# Patient Record
Sex: Female | Born: 1955 | Race: White | Hispanic: No | Smoking: Former smoker
Health system: Southern US, Community
[De-identification: ages and names within clinical notes are randomized; demographics above are authoritative.]

## PROBLEM LIST (undated history)

## (undated) DIAGNOSIS — E785 Hyperlipidemia, unspecified: Secondary | ICD-10-CM

## (undated) DIAGNOSIS — R55 Syncope and collapse: Secondary | ICD-10-CM

## (undated) DIAGNOSIS — I10 Essential (primary) hypertension: Secondary | ICD-10-CM

## (undated) DIAGNOSIS — J45909 Unspecified asthma, uncomplicated: Secondary | ICD-10-CM

## (undated) DIAGNOSIS — Z720 Tobacco use: Secondary | ICD-10-CM

## (undated) DIAGNOSIS — I639 Cerebral infarction, unspecified: Secondary | ICD-10-CM

## (undated) DIAGNOSIS — O039 Complete or unspecified spontaneous abortion without complication: Secondary | ICD-10-CM

## (undated) DIAGNOSIS — J439 Emphysema, unspecified: Secondary | ICD-10-CM

## (undated) DIAGNOSIS — M1990 Unspecified osteoarthritis, unspecified site: Secondary | ICD-10-CM

## (undated) DIAGNOSIS — E079 Disorder of thyroid, unspecified: Secondary | ICD-10-CM

## (undated) DIAGNOSIS — IMO0001 Reserved for inherently not codable concepts without codable children: Secondary | ICD-10-CM

## (undated) DIAGNOSIS — H269 Unspecified cataract: Secondary | ICD-10-CM

## (undated) HISTORY — DX: Unspecified asthma, uncomplicated: J45.909

## (undated) HISTORY — DX: Cerebral infarction, unspecified: I63.9

## (undated) HISTORY — PX: EYE SURGERY: SHX253

## (undated) HISTORY — DX: Disorder of thyroid, unspecified: E07.9

## (undated) HISTORY — DX: Syncope and collapse: R55

## (undated) HISTORY — PX: CATARACT EXTRACTION: SUR2

## (undated) HISTORY — DX: Unspecified cataract: H26.9

## (undated) HISTORY — DX: Unspecified osteoarthritis, unspecified site: M19.90

---
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
## 1992-09-01 HISTORY — PX: TUBAL LIGATION: SHX77

## 1996-09-01 HISTORY — PX: CHOLECYSTECTOMY: SHX55

## 1999-11-04 ENCOUNTER — Other Ambulatory Visit: Admission: RE | Admit: 1999-11-04 | Discharge: 1999-11-04 | Payer: Self-pay | Admitting: Obstetrics and Gynecology

## 1999-11-15 ENCOUNTER — Encounter: Payer: Self-pay | Admitting: Obstetrics and Gynecology

## 1999-11-15 ENCOUNTER — Ambulatory Visit (HOSPITAL_COMMUNITY): Admission: RE | Admit: 1999-11-15 | Discharge: 1999-11-15 | Payer: Self-pay | Admitting: Obstetrics and Gynecology

## 2020-10-15 ENCOUNTER — Ambulatory Visit (INDEPENDENT_AMBULATORY_CARE_PROVIDER_SITE_OTHER): Payer: Self-pay | Admitting: Physician Assistant

## 2020-10-15 ENCOUNTER — Other Ambulatory Visit: Payer: Self-pay

## 2020-10-15 ENCOUNTER — Encounter: Payer: Self-pay | Admitting: Physician Assistant

## 2020-10-15 VITALS — BP 142/90 | HR 92 | Temp 97.0°F | Resp 16 | Ht 61.0 in | Wt 198.0 lb

## 2020-10-15 DIAGNOSIS — M545 Low back pain, unspecified: Secondary | ICD-10-CM

## 2020-10-15 DIAGNOSIS — R55 Syncope and collapse: Secondary | ICD-10-CM

## 2020-10-15 DIAGNOSIS — R350 Frequency of micturition: Secondary | ICD-10-CM

## 2020-10-15 DIAGNOSIS — E038 Other specified hypothyroidism: Secondary | ICD-10-CM

## 2020-10-15 DIAGNOSIS — I10 Essential (primary) hypertension: Secondary | ICD-10-CM

## 2020-10-15 DIAGNOSIS — M170 Bilateral primary osteoarthritis of knee: Secondary | ICD-10-CM

## 2020-10-15 DIAGNOSIS — F17200 Nicotine dependence, unspecified, uncomplicated: Secondary | ICD-10-CM

## 2020-10-15 DIAGNOSIS — Z833 Family history of diabetes mellitus: Secondary | ICD-10-CM

## 2020-10-15 LAB — POCT URINALYSIS DIPSTICK
Bilirubin, UA: NEGATIVE
Blood, UA: NEGATIVE
Glucose, UA: NEGATIVE
Ketones, UA: NEGATIVE
Leukocytes, UA: NEGATIVE
Nitrite, UA: NEGATIVE
Protein, UA: NEGATIVE
Spec Grav, UA: >=1.03 — AB (ref 1.010–1.025)
Urobilinogen, UA: 0.2 E.U./dL
pH, UA: 5.5 (ref 5.0–8.0)

## 2020-10-15 LAB — COMPREHENSIVE METABOLIC PANEL
ALT: 12 U/L (ref 0–35)
AST: 16 U/L (ref 0–37)
Albumin: 4 g/dL (ref 3.5–5.2)
Alkaline Phosphatase: 98 U/L (ref 39–117)
BUN: 13 mg/dL (ref 6–23)
CO2: 27 mEq/L (ref 19–32)
Calcium: 10.2 mg/dL (ref 8.4–10.5)
Chloride: 106 mEq/L (ref 96–112)
Creatinine, Ser: 0.69 mg/dL (ref 0.40–1.20)
GFR: 91.53 mL/min (ref >60.00)
Glucose, Bld: 89 mg/dL (ref 70–99)
Potassium: 4.3 mEq/L (ref 3.5–5.1)
Sodium: 139 mEq/L (ref 135–145)
Total Bilirubin: 0.3 mg/dL (ref 0.2–1.2)
Total Protein: 7.2 g/dL (ref 6.0–8.3)

## 2020-10-15 LAB — CBC WITH DIFFERENTIAL/PLATELET
Basophils Absolute: 0.1 10*3/uL (ref 0.0–0.1)
Basophils Relative: 0.7 % (ref 0.0–3.0)
Eosinophils Absolute: 0.2 10*3/uL (ref 0.0–0.7)
Eosinophils Relative: 2.5 % (ref 0.0–5.0)
HCT: 42.5 % (ref 36.0–46.0)
Hemoglobin: 14.4 g/dL (ref 12.0–15.0)
Lymphocytes Relative: 16.9 % (ref 12.0–46.0)
Lymphs Abs: 1.5 10*3/uL (ref 0.7–4.0)
MCHC: 33.8 g/dL (ref 30.0–36.0)
MCV: 93.8 fl (ref 78.0–100.0)
Monocytes Absolute: 0.5 10*3/uL (ref 0.1–1.0)
Monocytes Relative: 5.5 % (ref 3.0–12.0)
Neutro Abs: 6.5 10*3/uL (ref 1.4–7.7)
Neutrophils Relative %: 74.4 % (ref 43.0–77.0)
Platelets: 307 10*3/uL (ref 150.0–400.0)
RBC: 4.53 Mil/uL (ref 3.87–5.11)
RDW: 12.9 % (ref 11.5–15.5)
WBC: 8.8 10*3/uL (ref 4.0–10.5)

## 2020-10-15 LAB — TSH: TSH: 3.21 u[IU]/mL (ref 0.35–4.50)
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
## 2020-10-15 NOTE — Patient Instructions (Addendum)
*Please set up MyChart *Go to the lab and results will be sent through MyChart, unless urgent, and I will call you directly *Let's plan on 6 month Annual Wellness Visit, plus a visit sooner pending lab results *GO IMMEDIATELY TO THE HOSPITAL IN CASE OF ANYMORE NEAR SYNCOPE OR SYNCOPE EVENTS     Syncope  Syncope refers to a condition in which a person temporarily loses consciousness. Syncope may also be called fainting or passing out. It is caused by a sudden decrease in blood flow to the brain. Even though most causes of syncope are not dangerous, syncope can be a sign of a serious medical problem. Your health care provider may do tests to find the reason why you are having syncope. Signs that you may be about to faint include:  Feeling dizzy or light-headed.  Feeling nauseous.  Seeing all white or all black in your field of vision.  Having cold, clammy skin. If you faint, get medical help right away. Call your local emergency services (911 in the [place] Do not drive yourself to the hospital. Follow these instructions at home: Pay attention to any changes in your symptoms. Take these actions to stay safe and to help relieve your symptoms: Lifestyle  Do not drive, use machinery, or play sports until your health care provider says it is okay.  Do not drink alcohol.  Do not use any products that contain nicotine or tobacco, such as cigarettes and e-cigarettes. If you need help quitting, ask your health care provider.  Drink enough fluid to keep your urine pale yellow. General instructions  Take over-the-counter and prescription medicines only as told by your health care provider.  If you are taking blood pressure or heart medicine, get up slowly and take several minutes to sit and then stand. This can reduce dizziness or light-headedness.  Have someone stay with you until you feel stable.  If you start to feel like you might faint, lie down right away and raise (elevate) your  feet above the level of your heart. Breathe deeply and steadily. Wait until all the symptoms have passed.  Keep all follow-up visits as told by your health care provider. This is important. Get help right away if you:  Have a severe headache.  Faint once or repeatedly.  Have pain in your chest, abdomen, or back.  Have a very fast or irregular heartbeat (palpitations).  Have pain when you breathe.  Are bleeding from your mouth or rectum, or you have black or tarry stool.  Have a seizure.  Are confused.  Have trouble walking.  Have severe weakness.  Have vision problems. These symptoms may represent a serious problem that is an emergency. Do not wait to see if your symptoms will go away. Get medical help right away. Call your local emergency services (911 in the [place] Do not drive yourself to the hospital. Summary  Syncope refers to a condition in which a person temporarily loses consciousness. It is caused by a sudden decrease in blood flow to the brain.  Signs that you may be about to faint include dizziness, feeling light-headed, feeling nauseous, sudden vision changes, or cold, clammy skin.  Although most causes of syncope are not dangerous, syncope can be a sign of a serious medical problem. If you faint, get medical help right away. This information is not intended to replace advice given to you by your health care provider. Make sure you discuss any questions you have with your health care provider. Document Revised:  12/29/2019 Document Reviewed: 12/29/2019 Elsevier Patient Education  2021 [employer].

## 2020-10-17 ENCOUNTER — Other Ambulatory Visit: Payer: Self-pay | Admitting: Physician Assistant

## 2020-10-17 ENCOUNTER — Other Ambulatory Visit: Payer: Self-pay | Admitting: Emergency Medicine

## 2020-10-17 DIAGNOSIS — R55 Syncope and collapse: Secondary | ICD-10-CM

## 2020-10-17 MED ORDER — PREDNISONE 50 MG PO TABS: ORAL_TABLET | ORAL | 0 refills | Status: DC

## 2021-05-20 ENCOUNTER — Ambulatory Visit: Payer: Self-pay | Admitting: Physician Assistant

## 2021-05-27 ENCOUNTER — Encounter: Payer: Self-pay | Admitting: Physician Assistant

## 2021-05-27 ENCOUNTER — Other Ambulatory Visit: Payer: Self-pay

## 2021-05-27 ENCOUNTER — Ambulatory Visit (INDEPENDENT_AMBULATORY_CARE_PROVIDER_SITE_OTHER): Payer: Medicare Other | Admitting: Physician Assistant

## 2021-05-27 VITALS — BP 141/88 | HR 83 | Temp 98.2°F | Ht 61.0 in | Wt 199.4 lb

## 2021-05-27 DIAGNOSIS — F17200 Nicotine dependence, unspecified, uncomplicated: Secondary | ICD-10-CM

## 2021-05-27 DIAGNOSIS — I10 Essential (primary) hypertension: Secondary | ICD-10-CM

## 2021-05-27 DIAGNOSIS — Z780 Asymptomatic menopausal state: Secondary | ICD-10-CM

## 2021-05-27 DIAGNOSIS — Z1211 Encounter for screening for malignant neoplasm of colon: Secondary | ICD-10-CM

## 2021-05-27 DIAGNOSIS — M79672 Pain in left foot: Secondary | ICD-10-CM

## 2021-05-27 DIAGNOSIS — Z122 Encounter for screening for malignant neoplasm of respiratory organs: Secondary | ICD-10-CM

## 2021-05-27 DIAGNOSIS — M79671 Pain in right foot: Secondary | ICD-10-CM

## 2021-05-27 DIAGNOSIS — Z9189 Other specified personal risk factors, not elsewhere classified: Secondary | ICD-10-CM

## 2021-05-27 DIAGNOSIS — R55 Syncope and collapse: Secondary | ICD-10-CM | POA: Diagnosis not present

## 2021-05-27 DIAGNOSIS — Z1231 Encounter for screening mammogram for malignant neoplasm of breast: Secondary | ICD-10-CM

## 2021-05-27 DIAGNOSIS — F17210 Nicotine dependence, cigarettes, uncomplicated: Secondary | ICD-10-CM

## 2021-05-27 DIAGNOSIS — M170 Bilateral primary osteoarthritis of knee: Secondary | ICD-10-CM | POA: Diagnosis not present

## 2021-05-27 NOTE — Patient Instructions (Addendum)
*  Please check BP at home over the next week, call back with readings. If still elevated 140s/high 80s-90s, need to start on BP medication.   Referrals placed today. Please stop smoking. Drink plenty of water, 60-80 ounces goal.  See you back in 3 months.

## 2021-05-27 NOTE — Progress Notes (Addendum)
Subjective:    Patient ID: Veronica Weber, female    DOB: [birth year], 65 y.o.   MRN: [id number]  Chief Complaint  Patient presents with   Referral    Podiatry,mammogram, Bone Density,Cologuard Orthopedic     HPI Patient is in today for need for several referrals.  She is here with her daughter.  Her main concern today is pain in her knees and feet. Worse in the last few years.  Works on Biochemist, clinical day Pharmacist, hospital, part-time), also cleans houses, and pain is very bad at the end of the day. Left knee is worse than right; feels like knees want to give out at end of day. Left heel is painful and right ball of foot causes her issues. Takes Ibuprofen, Tylenol, Advil as needed.  She is hoping to get a referral to podiatry and maybe sports medicine or orthopedics to discuss her issues and plan for treatment further.  She would also like to get several screening measures updated.  She would like to do her Cologuard and mammogram, as well as a bone density test.  She also wanted to follow-up on the episodes we discussed at her visit on 06/14/2021 of intermittent "passing out".  Patient states that she has not had any issues with this since September 03, 2020.  She is ready to see neurology and cardiology for additional evaluation.   Past Medical History:  Diagnosis Date   Arthritis    Cataract    Thyroid disease     Past Surgical History:  Procedure Laterality Date   CATARACT EXTRACTION     1997, 1998   CESAREAN SECTION  1983   CHOLECYSTECTOMY  1998   TUBAL LIGATION  1994    Family History  Problem Relation Age of Onset   Arthritis Mother    Healthy Daughter    Healthy Son    Cancer Maternal Grandmother    Diabetes Maternal Grandmother    Diabetes Maternal Grandfather     Social History   Tobacco Use   Smoking status: Every Day    Packs/day: 1.00    Years: 40.00    Pack years: 40.00    Types: Cigarettes   Smokeless tobacco: Never  Vaping Use    Vaping Use: Never used  Substance Use Topics   Alcohol use: Not Currently   Drug use: Never     No Known Allergies  Review of Systems REFER TO HPI FOR PERTINENT POSITIVES AND NEGATIVES      Objective:     BP (!) 141/88   Pulse 83   Temp 98.2 F (36.8 C)   Ht 5\' 1"  (1.549 m)   Wt 199 lb 6.1 oz (90.4 kg)   SpO2 94%   BMI 37.67 kg/m   Wt Readings from Last 3 Encounters:  06/03/21 200 lb 6.4 oz (90.9 kg)  05/27/21 199 lb 6.1 oz (90.4 kg)  10/15/20 198 lb (89.8 kg)    BP Readings from Last 3 Encounters:  06/03/21 (!) 166/102  05/27/21 (!) 141/88  10/15/20 (!) 142/90     Physical Exam Vitals and nursing note reviewed.  Constitutional:      Appearance: Normal appearance. She is normal weight. She is not toxic-appearing.  HENT:     Head: Normocephalic and atraumatic.     Right Ear: Tympanic membrane, ear canal and external ear normal.     Left Ear: Tympanic membrane, ear canal and external ear normal.     Nose: Nose normal.  Mouth/Throat:     Mouth: Mucous membranes are moist.  Eyes:     Extraocular Movements: Extraocular movements intact.     Conjunctiva/sclera: Conjunctivae normal.     Pupils: Pupils are equal, round, and reactive to light.  Cardiovascular:     Rate and Rhythm: Normal rate and regular rhythm.     Pulses: Normal pulses.     Heart sounds: Normal heart sounds.  Pulmonary:     Effort: Pulmonary effort is normal.     Breath sounds: Normal breath sounds.  Musculoskeletal:        General: Normal range of motion.     Cervical back: Normal range of motion and neck supple.  Skin:    General: Skin is warm and dry.  Neurological:     General: No focal deficit present.     Mental Status: She is alert and oriented to person, place, and time.  Psychiatric:        Mood and Affect: Mood normal.        Behavior: Behavior normal.        Thought Content: Thought content normal.        Judgment: Judgment normal.       Assessment & Plan:    Problem List Items Addressed This Visit   None Visit Diagnoses     Syncope, unspecified syncope type    -  Primary   Relevant Orders   Ambulatory referral to Neurology   Ambulatory referral to Cardiology   Current every day smoker       Relevant Orders   Ambulatory referral to Cardiology   Essential hypertension       Relevant Orders   Ambulatory referral to Cardiology   Osteoarthritis of both knees, unspecified osteoarthritis type       Relevant Orders   Ambulatory referral to Sports Medicine   Bilateral foot pain       Relevant Orders   Ambulatory referral to Podiatry   Screening for colon cancer       Relevant Orders   Cologuard   Encounter for screening mammogram for malignant neoplasm of breast       Relevant Orders   MM 3D SCREEN BREAST BILATERAL   Encounter for screening for lung cancer       At risk for loss of bone density       Relevant Orders   DG Bone Density   Postmenopausal       Relevant Orders   DG Bone Density   Cigarette nicotine dependence without complication           Patient now has Medicare insurance and is ready to get updated with several health maintenance requests and also additional evaluations from specialists.  I went through each request and placed these orders.  I still do not think she needs to be driving with her history of syncope until she sees cardiology and neurology.  Her daughter lives out of state, but is here for the next month to help her get to these appointments as they start to get scheduled.  She is really encouraged to quit smoking and drink more water.  We discussed low dose lung cancer screening due to her history of smoking (40 pack year history) and she is agreeable to have this done.  Her blood pressure is also elevated.  I have asked her to monitor this at home and if she is still having elevated readings, we will need to start on blood pressure medication.  Patient is understanding and agreeable.   This note was  prepared with assistance of Conservation officer, historic buildings. Occasional wrong-word or sound-a-like substitutions may have occurred due to the inherent limitations of voice recognition software.  Time Spent: 32 minutes of total time was spent on the date of the encounter performing the following actions: chart review prior to seeing the patient, obtaining history, performing a medically necessary exam, counseling on the treatment plan, placing orders, and documenting in our EHR.    Olyver Hawes M Momodou Consiglio, PA-C

## 2021-05-30 ENCOUNTER — Other Ambulatory Visit: Payer: Self-pay

## 2021-05-30 ENCOUNTER — Telehealth: Payer: Self-pay

## 2021-05-30 DIAGNOSIS — Z122 Encounter for screening for malignant neoplasm of respiratory organs: Secondary | ICD-10-CM

## 2021-05-30 NOTE — Telephone Encounter (Signed)
Patients daughter called and looking at her AVS she seen it said Encounter for screening for lung cancer on the right hand side under issues that were addressed and wanted to know where they go to get this done and If the referral was placed for them   Please return call

## 2021-05-30 NOTE — Telephone Encounter (Signed)
Pt called regarding her blood pressure readings. She stated she forgot to check the past 2 days but today it was  173/97. Please Advise.

## 2021-06-02 ENCOUNTER — Other Ambulatory Visit: Payer: Self-pay | Admitting: Physician Assistant

## 2021-06-02 DIAGNOSIS — Z122 Encounter for screening for malignant neoplasm of respiratory organs: Secondary | ICD-10-CM

## 2021-06-03 ENCOUNTER — Ambulatory Visit (INDEPENDENT_AMBULATORY_CARE_PROVIDER_SITE_OTHER): Payer: Medicare Other

## 2021-06-03 ENCOUNTER — Ambulatory Visit: Payer: Self-pay

## 2021-06-03 ENCOUNTER — Ambulatory Visit (INDEPENDENT_AMBULATORY_CARE_PROVIDER_SITE_OTHER): Payer: Medicare Other | Admitting: Family Medicine

## 2021-06-03 ENCOUNTER — Other Ambulatory Visit: Payer: Self-pay

## 2021-06-03 VITALS — BP 166/102 | HR 87 | Ht 61.0 in | Wt 200.4 lb

## 2021-06-03 DIAGNOSIS — M25562 Pain in left knee: Secondary | ICD-10-CM

## 2021-06-03 DIAGNOSIS — G8929 Other chronic pain: Secondary | ICD-10-CM

## 2021-06-03 DIAGNOSIS — M25561 Pain in right knee: Secondary | ICD-10-CM | POA: Diagnosis not present

## 2021-06-03 NOTE — Patient Instructions (Signed)
Thank you for coming in today.   Please get an Xray today before you leave   Call or go to the ER if you develop a large red swollen joint with extreme pain or oozing puss.    Please use Voltaren gel (Generic Diclofenac Gel) up to 4x daily for pain as needed.  This is available over-the-counter as both the name brand Voltaren gel and the generic diclofenac gel.   Let me know if not better.  Next step could be gel shots.   I can do the cortisone shot every 3 months.

## 2021-06-03 NOTE — Progress Notes (Signed)
I, Philbert Riser, LAT, ATC acting as a scribe for [name], MD.  Subjective:    CC: Bilat knee pain  HPI: Pt is a 65 y/o female c/o bilat knee pain x years, L>R. Pt notes a hx of injury to her L knee in 1991.  Pt locates pain to the anterior aspect of bilat knees w/ an occasional sharp pain in the posterior aspect.  Knee swelling: no Radiates: yes Mechanical symptoms: yes Aggravates: standing on hard floors, stairs Treatments tried: naproxen, IBU, Tylenol    Pertinent review of Systems: No fevers or chills  Relevant historical information: Hypertension   Objective:    Vitals:   06/03/21 1353  BP: (!) 166/102  Pulse: 87  SpO2: 97%   General: Well Developed, well nourished, and in no acute distress.   MSK: Right knee mild effusion normal motion with crepitation.  Stable ligamentous exam intact strength.  Left knee moderate effusion normal motion with crepitation stable ligamentous exam.  Intact strength.  Lab and Radiology Results  Procedure: Real-time Ultrasound Guided Injection of the right knee superior lateral patellar space Device: Philips Affiniti 50G Images permanently stored and available for review in PACS Verbal informed consent obtained.  Discussed risks and benefits of procedure. Warned about infection bleeding damage to structures skin hypopigmentation and fat atrophy among others. Patient expresses understanding and agreement Time-out conducted.   Noted no overlying erythema, induration, or other signs of local infection.   Skin prepped in a sterile fashion.   Local anesthesia: Topical Ethyl chloride.   With sterile technique and under real time ultrasound guidance: 40 mg of Kenalog and 2 mL of lidocaine injected into knee joint. Fluid seen entering the joint capsule.   Completed without difficulty   Pain immediately resolved suggesting accurate placement of the medication.   Advised to call if fevers/chills, erythema, induration, drainage, or  persistent bleeding.   Images permanently stored and available for review in the ultrasound unit.  Impression: Technically successful ultrasound guided injection.    Procedure: Real-time Ultrasound Guided Injection of left knee superior lateral patellar space Device: Philips Affiniti 50G Images permanently stored and available for review in PACS Verbal informed consent obtained.  Discussed risks and benefits of procedure. Warned about infection bleeding damage to structures skin hypopigmentation and fat atrophy among others. Patient expresses understanding and agreement Time-out conducted.   Noted no overlying erythema, induration, or other signs of local infection.   Skin prepped in a sterile fashion.   Local anesthesia: Topical Ethyl chloride.   With sterile technique and under real time ultrasound guidance: 40 mg of Kenalog and 2 mL of lidocaine injected into knee joint. Fluid seen entering the joint capsule.   Completed without difficulty   Pain immediately resolved suggesting accurate placement of the medication.   Advised to call if fevers/chills, erythema, induration, drainage, or persistent bleeding.   Images permanently stored and available for review in the ultrasound unit.  Impression: Technically successful ultrasound guided injection.    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  Moderate patellofemoral DJD.  No acute fractures.  Left knee: Severe medial compartment DJD.  Moderate DJD.  Await formal radiology review    Impression and Recommendations:    Assessment and Plan: 65 y.o. female with bilateral knee pain left worse than right.  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  This is probably the main driver of her knee  pain.  Plan for steroid injection today.  We will also work on hyaluronic acid authorization as a backup plan if the  steroid injections do not last very long as she does report this is a history of hers in the past.  Ultimately she is likely to need a knee replacement.  Recommend weight loss and quad strengthening as well.  Also recommend Voltaren gel.  PDMP not reviewed this encounter. Orders Placed This Encounter  Procedures   [place] LIMITED JOINT SPACE STRUCTURES LOW BILAT(NO LINKED CHARGES)    Standing Status:   Future    Number of Occurrences:   1    Standing Expiration Date:   12/02/2021    Order Specific Question:   Reason for Exam (SYMPTOM  OR DIAGNOSIS REQUIRED)    Answer:   bilateral knee pain    Order Specific Question:   Preferred imaging location?    Answer:   [facility] Knee AP/LAT W/Sunrise Left    Standing Status:   Future    Number of Occurrences:   1    Standing Expiration Date:   06/03/2022    Order Specific Question:   Reason for Exam (SYMPTOM  OR DIAGNOSIS REQUIRED)    Answer:   bilateral knee pain    Order Specific Question:   Preferred imaging location?    Answer:   [facility] Knee AP/LAT W/Sunrise Right    Standing Status:   Future    Number of Occurrences:   1    Standing Expiration Date:   06/03/2022    Order Specific Question:   Reason for Exam (SYMPTOM  OR DIAGNOSIS REQUIRED)    Answer:   bilateral knee pain    Order Specific Question:   Preferred imaging location?    Answer:   [facility]   No orders of the defined types were placed in this encounter.   Discussed warning signs or symptoms. Please see discharge instructions. Patient expresses understanding.   The above documentation has been reviewed and is accurate and complete [name], M.D.

## 2021-06-04 ENCOUNTER — Other Ambulatory Visit: Payer: Self-pay

## 2021-06-04 MED ORDER — LOSARTAN POTASSIUM 25 MG PO TABS: 25.0000 mg | ORAL_TABLET | Freq: Every day | ORAL | 0 refills | Status: DC

## 2021-06-04 NOTE — Telephone Encounter (Signed)
Rx sent in

## 2021-06-04 NOTE — Telephone Encounter (Signed)
Called pt to inform her approval for bilat Monovisc injections. Pt was advised to call the office whenever her knees become painful and she's interested in pursing the gel injection.

## 2021-06-04 NOTE — Progress Notes (Signed)
Right knee x-ray shows severe arthritis in the medial (inside part) of the knee

## 2021-06-04 NOTE — Progress Notes (Signed)
Left knee x-ray shows severe arthritis on the medial (inside part) of the knee.

## 2021-06-05 ENCOUNTER — Telehealth: Payer: Self-pay | Admitting: Acute Care

## 2021-06-05 ENCOUNTER — Other Ambulatory Visit: Payer: Self-pay | Admitting: Physician Assistant

## 2021-06-05 ENCOUNTER — Encounter: Payer: Self-pay | Admitting: Podiatry

## 2021-06-05 ENCOUNTER — Ambulatory Visit (INDEPENDENT_AMBULATORY_CARE_PROVIDER_SITE_OTHER): Payer: Medicare Other | Admitting: Podiatry

## 2021-06-05 ENCOUNTER — Other Ambulatory Visit: Payer: Self-pay

## 2021-06-05 ENCOUNTER — Ambulatory Visit (INDEPENDENT_AMBULATORY_CARE_PROVIDER_SITE_OTHER): Payer: Medicare Other

## 2021-06-05 DIAGNOSIS — M79674 Pain in right toe(s): Secondary | ICD-10-CM

## 2021-06-05 DIAGNOSIS — M79671 Pain in right foot: Secondary | ICD-10-CM

## 2021-06-05 DIAGNOSIS — M79675 Pain in left toe(s): Secondary | ICD-10-CM | POA: Diagnosis not present

## 2021-06-05 DIAGNOSIS — M79672 Pain in left foot: Secondary | ICD-10-CM

## 2021-06-05 DIAGNOSIS — F17210 Nicotine dependence, cigarettes, uncomplicated: Secondary | ICD-10-CM

## 2021-06-05 DIAGNOSIS — B351 Tinea unguium: Secondary | ICD-10-CM

## 2021-06-05 DIAGNOSIS — L989 Disorder of the skin and subcutaneous tissue, unspecified: Secondary | ICD-10-CM

## 2021-06-10 NOTE — Telephone Encounter (Signed)
Spoke with Misty Stanley at [facility] office and verified that pt's referral to lung cancer screening is in my workque and assured her that she entered referral correctly. Nothing further needed at this time.

## 2021-06-11 NOTE — Progress Notes (Signed)
   SUBJECTIVE Patient presents to office today complaining of elongated, thickened nails that cause pain while ambulating in shoes.  Patient is unable to trim their own nails.  Patient also complains of painful calluses specifically to the left foot that seem to be recurring.  She tried to file them down herself with a very symptomatic and it causes pain.  patient is here for further evaluation and treatment.  Past Medical History:  Diagnosis Date   Arthritis    Cataract    Thyroid disease     OBJECTIVE General Patient is awake, alert, and oriented x 3 and in no acute distress. Derm Skin is dry and supple bilateral. Negative open lesions or macerations. Remaining integument unremarkable. Nails are tender, long, thickened and dystrophic with subungual debris, consistent with onychomycosis, 1-5 bilateral. No signs of infection noted. Symptomatic hyperkeratotic callus lesions are also noted to the weightbearing surfaces of the bilateral feet Vasc  DP and PT pedal pulses palpable bilaterally. Temperature gradient within normal limits.  Neuro Epicritic and protective threshold sensation grossly intact bilaterally.  Musculoskeletal Exam No symptomatic pedal deformities noted bilateral. Muscular strength within normal limits.  ASSESSMENT 1.  Pain due to onychomycosis of toenails both 2.  Porokeratosis bilateral feet  PLAN OF CARE 1. Patient evaluated today.  2. Instructed to maintain good pedal hygiene and foot care.  3. Mechanical debridement of nails 1-5 bilaterally performed using a nail nipper. Filed with dremel without incident.  4.  Excisional debridement of the porokeratosis callus lesions was performed using 312 scalpel without incident or bleeding.  Salicylic acid applied.  Recommend daily OTC corn and callus remover  5.  Continue new balance shoes and arch supports  6.  Return to clinic as needed     Felecia Shelling, DPM [facility]  Dr. Felecia Shelling, DPM     [address] [place], [place] [address]                Office [phone]  Fax [phone]

## 2021-06-25 NOTE — Progress Notes (Signed)
[facility]  PATIENT: Veronica Weber DOB: 1955/10/23  REFERRING CLINICIAN: Allwardt, Crist Infante, PA-C HISTORY FROM: self REASON FOR VISIT: syncope   HISTORICAL  CHIEF COMPLAINT:  Chief Complaint  Patient presents with   Syncope    Rm 6 New Pt  "last time I passed out was 09/04/2020, none since then but I have a history of this x 8 years; will see cardiology on 07/09/21"     HISTORY OF PRESENT ILLNESS:  The patient presents for evaluation of syncopal episodes which have occurred over the past 8 years. Her last episode of syncope was 09/04/20. At that time she was at work and she developed a "funny sensation like a pressure or a smell" in her sinuses. Then she developed pressure in the right side of her head. She then developed palpitations and diaphoresis. States it felt like a panic attack. Felt lightheaded and lost consciousness. No associated shaking, loss of continence, or tongue biting. Regained consciousness after a few seconds and felt back to normal after ~5 minutes.  All of her episodes occur with the same symptoms. Sometimes she will just have one in a day but sometimes they will come on and off throughout the day, occurring every 15-20 minutes. They occur every ~6 months or so. No clear triggers that she can identify. They can occur whether she is standing or sitting.  She does occasionally get right sided headaches which do not respond to OTCs. Has some nausea with the headaches. Bright lights and loud noises bother her at baseline.  Takes losartan for HTN. Just started this earlier this month and it has not seemed to make a difference in her episodes.  OTHER MEDICAL CONDITIONS: HTN   REVIEW OF SYSTEMS: Full 14 system review of systems performed and negative with exception of: syncope, palpitations  ALLERGIES: No Known Allergies  HOME MEDICATIONS: Outpatient Medications Prior to Visit  Medication Sig Dispense Refill   acetaminophen (TYLENOL)  500 MG tablet Take 500 mg by mouth every 6 (six) hours as needed.     ibuprofen (ADVIL) 200 MG tablet Take 200 mg by mouth every 6 (six) hours as needed.     losartan (COZAAR) 25 MG tablet Take 1 tablet (25 mg total) by mouth daily. 90 tablet 0   No facility-administered medications prior to visit.    PAST MEDICAL HISTORY: Past Medical History:  Diagnosis Date   Arthritis    Cataract    Thyroid disease     PAST SURGICAL HISTORY: Past Surgical History:  Procedure Laterality Date   CATARACT EXTRACTION     1997, 1998   CESAREAN SECTION  1983   CHOLECYSTECTOMY  1998   TUBAL LIGATION  1994    FAMILY HISTORY: Family History  Problem Relation Age of Onset   Arthritis Mother    Cancer Maternal Grandmother    Diabetes Maternal Grandmother    Diabetes Maternal Grandfather    Healthy Daughter    Healthy Son     SOCIAL HISTORY: Social History   Socioeconomic History   Marital status: Divorced    Spouse name: Not on file   Number of children: 2   Years of education: Not on file   Highest education level: Some college, no degree  Occupational History   Not on file  Tobacco Use   Smoking status: Every Day    Packs/day: 0.50    Years: 40.00    Pack years: 20.00    Types: Cigarettes   Smokeless tobacco: Never  Vaping Use   Vaping Use: Never used  Substance and Sexual Activity   Alcohol use: Not Currently   Drug use: Never   Sexual activity: Not Currently  Other Topics Concern   Not on file  Social History Narrative   Lives alone   Caffeine- coffee 1 -2 c daily   Social Determinants of Health   Financial Resource Strain: Not on file  Food Insecurity: Not on file  Transportation Needs: Not on file  Physical Activity: Not on file  Stress: Not on file  Social Connections: Not on file  Intimate Partner Violence: Not on file     PHYSICAL EXAM  GENERAL EXAM/CONSTITUTIONAL: Vitals:  Vitals:   06/26/21 1247  BP: (!) 146/89  Pulse: 97  Weight: 197 lb (89.4  kg)  Height: 5' 0.75" (1.543 m)   Body mass index is 37.53 kg/m. Wt Readings from Last 3 Encounters:  06/26/21 197 lb (89.4 kg)  06/03/21 200 lb 6.4 oz (90.9 kg)  05/27/21 199 lb 6.1 oz (90.4 kg)   Patient is in no distress; well developed, nourished and groomed; neck is supple  CARDIOVASCULAR: Examination of peripheral vascular system by observation and palpation is normal  EYES: Pupils round and reactive to light, Visual fields full to confrontation, Extraocular movements intacts,   MUSCULOSKELETAL: Gait, strength, tone, movements noted in Neurologic exam below  NEUROLOGIC: MENTAL STATUS:  awake, alert, oriented to person, place and time recent and remote memory intact normal attention and concentration language fluent, comprehension intact, naming intact fund of knowledge appropriate  CRANIAL NERVE:  2nd, 3rd, 4th, 6th - pupils equal and reactive to light, visual fields full to confrontation, extraocular muscles intact, no nystagmus 5th - facial sensation symmetric 7th - facial strength symmetric 8th - hearing intact 9th - palate elevates symmetrically, uvula midline 11th - shoulder shrug symmetric 12th - tongue protrusion midline  MOTOR:  normal bulk and tone, full strength in the BUE, BLE  SENSORY:  normal and symmetric to light touch all 4 extremities  COORDINATION:  finger-nose-finger intact  REFLEXES:  deep tendon reflexes present and symmetric  GAIT/STATION:  normal     DIAGNOSTIC DATA (LABS, IMAGING, TESTING) - I reviewed patient records, labs, notes, testing and imaging myself where available.  Lab Results  Component Value Date   WBC 8.8 10/15/2020   HGB 14.4 10/15/2020   HCT 42.5 10/15/2020   MCV 93.8 10/15/2020   PLT 307.0 10/15/2020      Component Value Date/Time   NA 139 10/15/2020 1142   K 4.3 10/15/2020 1142   CL 106 10/15/2020 1142   CO2 27 10/15/2020 1142   GLUCOSE 89 10/15/2020 1142   BUN 13 10/15/2020 1142   CREATININE  0.69 10/15/2020 1142   CALCIUM 10.2 10/15/2020 1142   PROT 7.2 10/15/2020 1142   ALBUMIN 4.0 10/15/2020 1142   AST 16 10/15/2020 1142   ALT 12 10/15/2020 1142   ALKPHOS 98 10/15/2020 1142   BILITOT 0.3 10/15/2020 1142   No results found for: CHOL, HDL, LDLCALC, LDLDIRECT, TRIG, CHOLHDL No results found for: HENI7P No results found for: VITAMINB12 Lab Results  Component Value Date   TSH 3.21 10/15/2020       ASSESSMENT AND PLAN  65 y.o. year old female with a history of HTN who presents for evaluation of episodes of loss of consciousness. Her prodromal symptoms sound most consistent with syncope, though unusual head sensation/smell and feeling of panic before episode can be seen in seizures. Will order EEG  to assess for signs of epilepsy. She will be evaluated by Cardiology on 11/8 for cardiogenic signs of syncope. Encouraged her to keep this appointment especially as she does endorse a family history of heart murmurs.   1. Syncope and collapse       PLAN: -Routine EEG -Cardiology appointment scheduled for 11/8 -next steps: consider MRI brain if cardiology workup negative or abnormalities seen on EEG  Orders Placed This Encounter  Procedures   EEG adult     Return after testing.    [name], MD 06/26/21 1:41 pm  I spent an average of 49 minutes chart reviewing and counseling the patient, with at least 50% of the time face to face with the patient.   [facility] [address], [address] [place], [place] [address] [phone]

## 2021-06-26 ENCOUNTER — Other Ambulatory Visit: Payer: Self-pay

## 2021-06-26 ENCOUNTER — Ambulatory Visit (INDEPENDENT_AMBULATORY_CARE_PROVIDER_SITE_OTHER): Payer: Medicare Other | Admitting: Psychiatry

## 2021-06-26 ENCOUNTER — Encounter: Payer: Self-pay | Admitting: Psychiatry

## 2021-06-26 VITALS — BP 146/89 | HR 97 | Ht 60.75 in | Wt 197.0 lb

## 2021-06-26 DIAGNOSIS — R55 Syncope and collapse: Secondary | ICD-10-CM

## 2021-06-26 NOTE — Patient Instructions (Signed)
EEG to look for seizures

## 2021-06-28 ENCOUNTER — Ambulatory Visit
Admission: RE | Admit: 2021-06-28 | Discharge: 2021-06-28 | Disposition: A | Discharge status: Home / Self Care | Payer: Medicare Other | Source: Ambulatory Visit | Attending: Physician Assistant | Admitting: Physician Assistant

## 2021-06-28 ENCOUNTER — Other Ambulatory Visit: Payer: Self-pay

## 2021-06-28 DIAGNOSIS — F17210 Nicotine dependence, cigarettes, uncomplicated: Secondary | ICD-10-CM

## 2021-07-01 ENCOUNTER — Ambulatory Visit (INDEPENDENT_AMBULATORY_CARE_PROVIDER_SITE_OTHER): Payer: Medicare Other | Admitting: Neurology

## 2021-07-01 DIAGNOSIS — R55 Syncope and collapse: Secondary | ICD-10-CM | POA: Diagnosis not present

## 2021-07-03 NOTE — Procedures (Signed)
    History:  18 yof with multiple syncope vs. seizure  EEG classification: Awake and drowsy  Description of the recording: The background rhythms of this recording consists of a fairly well modulated medium amplitude alpha rhythm of 9-10 Hz that is reactive to eye opening and closure. As the record progresses, the patient appears to remain in the waking state throughout the recording. Photic stimulation was performed, did not show any abnormalities. Hyperventilation was also performed, did not show any abnormalities. Toward the end of the recording, the patient enters the drowsy state with slight symmetric slowing seen. The patient never enters stage II sleep. No abnormal epileptiform discharges seen during this recording. There was no focal slowing. EKG monitor shows no evidence of cardiac rhythm abnormalities with a heart rate of 84.  Impression: This is a normal EEG recording in the waking and drowsy state. No evidence of interictal epileptiform discharges seen. A normal EEG does not exclude a diagnosis of epilepsy.    Windell Norfolk, MD [facility]

## 2021-07-09 ENCOUNTER — Other Ambulatory Visit: Payer: Self-pay

## 2021-07-09 ENCOUNTER — Ambulatory Visit (INDEPENDENT_AMBULATORY_CARE_PROVIDER_SITE_OTHER): Payer: Medicare Other | Admitting: Internal Medicine

## 2021-07-09 ENCOUNTER — Encounter: Payer: Self-pay | Admitting: Internal Medicine

## 2021-07-09 VITALS — BP 142/84 | HR 92 | Ht 60.75 in | Wt 199.0 lb

## 2021-07-09 DIAGNOSIS — I7090 Unspecified atherosclerosis: Secondary | ICD-10-CM | POA: Diagnosis not present

## 2021-07-09 DIAGNOSIS — R55 Syncope and collapse: Secondary | ICD-10-CM

## 2021-07-09 LAB — LIPID PANEL
Chol/HDL Ratio: 4.9 ratio — ABNORMAL HIGH (ref 0.0–4.4)
Cholesterol, Total: 251 mg/dL — ABNORMAL HIGH (ref 100–199)
HDL: 51 mg/dL (ref >39)
LDL Chol Calc (NIH): 173 mg/dL — ABNORMAL HIGH (ref 0–99)
Triglycerides: 148 mg/dL (ref 0–149)
VLDL Cholesterol Cal: 27 mg/dL (ref 5–40)

## 2021-07-09 MED ORDER — LOSARTAN POTASSIUM 50 MG PO TABS: 50.0000 mg | ORAL_TABLET | Freq: Every day | ORAL | 3 refills | Status: DC

## 2021-07-09 NOTE — Patient Instructions (Signed)
Medication Instructions:  Increase Losartan to 50 mg daily Continue all other medications *If you need a refill on your cardiac medications before your next appointment, please call your pharmacy*   Lab Work: Lipid Panel   Testing/Procedures: Echo  30 day Event Monitor   Follow-Up: At [facility], you and your health needs are our priority.  As part of our continuing mission to provide you with exceptional heart care, we have created designated Provider Care Teams.  These Care Teams include your primary Cardiologist (physician) and Advanced Practice Providers (APPs -  Physician Assistants and Nurse Practitioners) who all work together to provide you with the care you need, when you need it.  We recommend signing up for the patient portal called "MyChart".  Sign up information is provided on this After Visit Summary.  MyChart is used to connect with patients for Virtual Visits (Telemedicine).  Patients are able to view lab/test results, encounter notes, upcoming appointments, etc.  Non-urgent messages can be sent to your provider as well.   To learn more about what you can do with MyChart, go to [email or web address].      Your next appointment:  3 months    The format for your next appointment: Office   Provider:  Dr.Branch

## 2021-07-09 NOTE — Progress Notes (Signed)
Cardiology Office Note:    Date:  07/09/2021   ID:  Veronica Weber, DOB 1955/12/20, MRN [id number]  PCP:  Allwardt, Crist Infante, PA-C   [facility] Providers Cardiologist:  None     Referring MD: Bary Leriche, PA-C   No chief complaint on file. Syncope  History of Present Illness:    Veronica Weber is a 65 y.o. female with a hx of arthritis, thyroid dx, tubal ligation, cholesystectomy, who presents with syncope  She saw neurology for syncope 06/26/2021. She noted an 8 year hx of syncope last 09/02/2020. She developed a funny sensation like a tingling in her nose. She developed pressure in her head. She had some palpitations and diaphoresis. It felt like a panic attack. She was LH and lost consciousness. She notes in the past she was tired 5-6 years ago and she was driving. She says the symptoms hit her and she has to take deep breaths. She has this prodrome for each event. No seizure symptoms. She has these episodes every 6 months or so. She was ordered a routine EEG and will follow up with them. She was told it was normal. She denies family hx of SCD. She has no known cardiac disease. She is a 45 year smoker. Her SBP 140s-160s, DBP < 100s. Her father had stents. Her mother has a heart murmur.  No siblings. She has two children, they are healthy. When doing her day to day job she feels no chest pain or SOB. She has not had any stress test or LHC.   Prior EKG- NSR, LAE, poor r wave progression  CT scan for lung cancer screening CT lung 06/28/2021 Cardiovascular: Heart size is normal. There is no significant pericardial fluid, thickening or pericardial calcification. There is aortic atherosclerosis, as well as atherosclerosis of the great vessels of the mediastinum and the coronary arteries, including calcified atherosclerotic plaque in the left main, left anterior descending and right coronary arteries.   Past Medical History:  Diagnosis Date   Arthritis     Cataract    Thyroid disease     Past Surgical History:  Procedure Laterality Date   CATARACT EXTRACTION     1997, 1998   CESAREAN SECTION  1983   CHOLECYSTECTOMY  1998   TUBAL LIGATION  1994    Current Medications: No outpatient medications have been marked as taking for the 07/09/21 encounter (Appointment) with [name], MD.     Allergies:   Patient has no known allergies.   Social History   Socioeconomic History   Marital status: Divorced    Spouse name: Not on file   Number of children: 2   Years of education: Not on file   Highest education level: Some college, no degree  Occupational History   Not on file  Tobacco Use   Smoking status: Every Day    Packs/day: 0.50    Years: 40.00    Pack years: 20.00    Types: Cigarettes   Smokeless tobacco: Never  Vaping Use   Vaping Use: Never used  Substance and Sexual Activity   Alcohol use: Not Currently   Drug use: Never   Sexual activity: Not Currently  Other Topics Concern   Not on file  Social History Narrative   Lives alone   Caffeine- coffee 1 -2 c daily   Social Determinants of Health   Financial Resource Strain: Not on file  Food Insecurity: Not on file  Transportation Needs: Not on file  Physical Activity: Not on file  Stress: Not on file  Social Connections: Not on file     Family History: The patient's family history includes Arthritis in her mother; Cancer in her maternal grandmother; Diabetes in her maternal grandfather and maternal grandmother; Healthy in her daughter and son.  ROS:   Please see the history of present illness.     All other systems reviewed and are negative.  EKGs/Labs/Other Studies Reviewed:    The following studies were reviewed today:   EKG:  EKG is  ordered today.  The ekg ordered today demonstrates   NSR, anteriorly/ poor R wave progression, no acute ischemic changes  Recent Labs: 10/15/2020: ALT 12; BUN 13; Creatinine, Ser 0.69; Hemoglobin 14.4; Platelets  307.0; Potassium 4.3; Sodium 139; TSH 3.21  Recent Lipid Panel No results found for: CHOL, TRIG, HDL, CHOLHDL, VLDL, LDLCALC, LDLDIRECT   Risk Assessment/Calculations:     The ASCVD Risk score (Arnett DK, et al., 2019) failed to calculate for the following reasons:   Cannot find a previous HDL lab   Cannot find a previous total cholesterol lab   Physical Exam:    VS:   Vitals:   07/09/21 1013  BP: (!) 142/84  Pulse: 92  SpO2: 99%     Wt Readings from Last 3 Encounters:  06/26/21 197 lb (89.4 kg)  06/03/21 200 lb 6.4 oz (90.9 kg)  05/27/21 199 lb 6.1 oz (90.4 kg)     GEN:  Well nourished, well developed in no acute distress HEENT: Normal NECK: No JVD; No carotid bruits LYMPHATICS: No lymphadenopathy CARDIAC: RRR, no murmurs, rubs, gallops RESPIRATORY:  Clear to auscultation without rales, wheezing or rhonchi  ABDOMEN: Soft, non-tender, non-distended MUSCULOSKELETAL:  No edema; No deformity  SKIN: Warm and dry NEUROLOGIC:  Alert and oriented x 3 PSYCHIATRIC:  Normal affect   ASSESSMENT:   #Syncope: she has a prodrome c/f vasovagal syncope. She's had recurrent episodes for a prolonged period of time. Unlikely concerning tachy or brady arrhythmia. With recurrent episodes will work up with cardiac event monitor and TTE to ensure events correlate with normal rhythm and r/o structural/valvluar heart dx.   #Atherosclerosis: she has diffuse calcification in the aorta, coronary arteries notable LM and LAD. No lipid panel to calculate ASCVD will obtain lipids  and she likely meets criteria for statin therapy. Since she is currently asymptomatic we will continue observe and obtain TTE. Provided smoking cessation.  #HTN: her blood pressure is not at goal; ideally < 130/80 mmHg. Will increase her losartan to 50 mg daily.   PLAN:    In order of problems listed above:  TTE Increase losartan 50 mg daily Cardiac event monitor 30 days Lipid profile Follow up in 3 months         Medication Adjustments/Labs and Tests Ordered: Current medicines are reviewed at length with the patient today.  Concerns regarding medicines are outlined above.   Signed, [name], MD  07/09/2021 9:17 AM     [facility]

## 2021-07-10 ENCOUNTER — Other Ambulatory Visit: Payer: Self-pay | Admitting: Internal Medicine

## 2021-07-10 DIAGNOSIS — I7090 Unspecified atherosclerosis: Secondary | ICD-10-CM

## 2021-07-10 MED ORDER — ATORVASTATIN CALCIUM 40 MG PO TABS: 40.0000 mg | ORAL_TABLET | Freq: Every day | ORAL | 3 refills | Status: DC

## 2021-07-18 ENCOUNTER — Ambulatory Visit (HOSPITAL_COMMUNITY): Payer: Medicare Other | Attending: Cardiology

## 2021-07-18 ENCOUNTER — Ambulatory Visit (INDEPENDENT_AMBULATORY_CARE_PROVIDER_SITE_OTHER): Payer: Medicare Other

## 2021-07-18 ENCOUNTER — Other Ambulatory Visit: Payer: Self-pay

## 2021-07-18 DIAGNOSIS — R55 Syncope and collapse: Secondary | ICD-10-CM

## 2021-07-18 LAB — ECHOCARDIOGRAM COMPLETE
Area-P 1/2: 3.77 cm2
S' Lateral: 2.7 cm

## 2021-07-18 NOTE — Progress Notes (Unsigned)
[employer] monitor serial # [id number] from 07/09/21 enrollment applied to patient in office.

## 2021-07-29 ENCOUNTER — Other Ambulatory Visit: Payer: Self-pay | Admitting: *Deleted

## 2021-07-29 DIAGNOSIS — R9389 Abnormal findings on diagnostic imaging of other specified body structures: Secondary | ICD-10-CM

## 2021-07-29 DIAGNOSIS — J439 Emphysema, unspecified: Secondary | ICD-10-CM

## 2021-08-06 NOTE — Progress Notes (Signed)
Synopsis: Referred for lung nodules by Allwardt, Crist Infante, PA-C  Subjective:   PATIENT ID: Veronica Weber GENDER: female DOB: [birth year], MRN: [id number]  Chief Complaint  Patient presents with   Consult    Pt states here for possible emphysema , cough at night more then day.     65yF with history of smoking, thyroid disease who is referred for nodular opacities on lung cancer screening CT Chest.  Quit smoking when she was pregnant with children - cold [place]. Tried patches a long time ago, has never tried chantix.   She doesn't think she has increased DOE relative to peers who are healthy and don't smoke. Has never had course of steroids for breathing/cough. Thinks she's getting over a bout of bronchitis, still has a productive cough in AM and at night.  Otherwise pertinent review of systems is negative.  No direct family history of lung disease.   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 She lived in [place] for 13.5 years, moved back to [place] in 1992. No recent travel. She has 2 dogs.  Past Medical History:  Diagnosis Date   Arthritis    Cataract    Thyroid disease      Family History  Problem Relation Age of Onset   Arthritis Mother    Cancer Maternal Grandmother    Diabetes Maternal Grandmother    Diabetes Maternal Grandfather    Healthy Daughter    Healthy Son      Past Surgical History:  Procedure Laterality Date   CATARACT EXTRACTION     1997, 1998   CESAREAN SECTION  1983   CHOLECYSTECTOMY  1998   TUBAL LIGATION  1994    Social History   Socioeconomic History   Marital status: Divorced    Spouse name: Not on file   Number of children: 2   Years of education: Not on file   Highest education level: Some college, no degree  Occupational History   Not on file  Tobacco Use   Smoking status: Every Day    Packs/day: 0.50    Years: 45.00    Pack years: 22.50    Types: Cigarettes    Start date: 06/01/1976   Smokeless tobacco: Never  Vaping  Use   Vaping Use: Never used  Substance and Sexual Activity   Alcohol use: Not Currently   Drug use: Never   Sexual activity: Not Currently  Other Topics Concern   Not on file  Social History Narrative   Lives alone   Caffeine- coffee 1 -2 c daily   Social Determinants of Health   Financial Resource Strain: Not on file  Food Insecurity: Not on file  Transportation Needs: Not on file  Physical Activity: Not on file  Stress: Not on file  Social Connections: Not on file  Intimate Partner Violence: Not on file     No Known Allergies   Outpatient Medications Prior to Visit  Medication Sig Dispense Refill   atorvastatin (LIPITOR) 40 MG tablet Take 1 tablet (40 mg total) by mouth daily. 90 tablet 3   losartan (COZAAR) 50 MG tablet Take 1 tablet (50 mg total) by mouth daily. 90 tablet 3   acetaminophen (TYLENOL) 500 MG tablet Take 500 mg by mouth every 6 (six) hours as needed. (Patient not taking: Reported on 07/09/2021)     ibuprofen (ADVIL) 200 MG tablet Take 200 mg by mouth every 6 (six) hours as needed. (Patient not taking: Reported on 07/09/2021)  No facility-administered medications prior to visit.       Objective:   Physical Exam:  General appearance: 65 y.o., female, NAD, conversant  Eyes: anicteric sclerae, moist conjunctivae; no lid-lag; PERRL, tracking appropriately HENT: NCAT; oropharynx, MMM, no mucosal ulcerations; normal hard and soft palate Neck: Trachea midline; no lymphadenopathy, no JVD Lungs: CTAB, no crackles, no wheeze, with normal respiratory effort CV: RRR, no MRGs  Abdomen: Soft, non-tender; non-distended, BS present  Extremities: No peripheral edema, radial and DP pulses present bilaterally  Skin: Normal temperature, turgor and texture; no rash Psych: Appropriate affect Neuro: Alert and oriented to person and place, no focal deficit    Vitals:   08/07/21 1003  BP: (!) 142/80  Pulse: 96  Temp: 97.6 F (36.4 C)  TempSrc: Oral  SpO2: 96%   Weight: 196 lb 3.2 oz (89 kg)  Height: 5' 0.75" (1.543 m)   96% on  RA BMI Readings from Last 3 Encounters:  08/07/21 37.38 kg/m  07/09/21 37.91 kg/m  06/26/21 37.53 kg/m   Wt Readings from Last 3 Encounters:  08/07/21 196 lb 3.2 oz (89 kg)  07/09/21 199 lb (90.3 kg)  06/26/21 197 lb (89.4 kg)     CBC    Component Value Date/Time   WBC 8.8 10/15/2020 1142   RBC 4.53 10/15/2020 1142   HGB 14.4 10/15/2020 1142   HCT 42.5 10/15/2020 1142   PLT 307.0 10/15/2020 1142   MCV 93.8 10/15/2020 1142   MCHC 33.8 10/15/2020 1142   RDW 12.9 10/15/2020 1142   LYMPHSABS 1.5 10/15/2020 1142   MONOABS 0.5 10/15/2020 1142   EOSABS 0.2 10/15/2020 1142   BASOSABS 0.1 10/15/2020 1142    Eos 200  Chest Imaging: CT Chest 06/28/21 reviewed by me with bronchial wall thickening, nodular foci posterior basal LLL and another area of possible scar in anterior lingula. No suspicous LAD.  Pulmonary Functions Testing Results: No flowsheet data found.   Echocardiogram:   TTE 11/17 G1DD       Assessment & Plan:   # 37mm ?LLL nodule: posterior basal LLL and another area of possible scar in anterior lingula. No suspicous LAD. 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  # Smoking # ?Emphysema - very subtle on imagine # Bronchial wall thickening  Plan: - CT chest 3 months - PFTs on same day as next clinic appointment - smoking cessation strongly encouraged. We will try chantix.     Omar Person, MD [facility] 08/07/2021 10:14 AM

## 2021-08-07 ENCOUNTER — Ambulatory Visit (INDEPENDENT_AMBULATORY_CARE_PROVIDER_SITE_OTHER): Payer: Medicare Other | Admitting: Student

## 2021-08-07 ENCOUNTER — Other Ambulatory Visit: Payer: Self-pay

## 2021-08-07 ENCOUNTER — Encounter: Payer: Self-pay | Admitting: Student

## 2021-08-07 VITALS — BP 142/80 | HR 96 | Temp 97.6°F | Ht 60.75 in | Wt 196.2 lb

## 2021-08-07 DIAGNOSIS — R918 Other nonspecific abnormal finding of lung field: Secondary | ICD-10-CM | POA: Diagnosis not present

## 2021-08-07 DIAGNOSIS — IMO0001 Reserved for inherently not codable concepts without codable children: Secondary | ICD-10-CM

## 2021-08-07 DIAGNOSIS — F17210 Nicotine dependence, cigarettes, uncomplicated: Secondary | ICD-10-CM | POA: Diagnosis not present

## 2021-08-07 DIAGNOSIS — F17200 Nicotine dependence, unspecified, uncomplicated: Secondary | ICD-10-CM

## 2021-08-07 MED ORDER — VARENICLINE TARTRATE 1 MG PO TABS: 1.0000 mg | ORAL_TABLET | Freq: Two times a day (BID) | ORAL | 1 refills | Status: DC

## 2021-08-07 MED ORDER — VARENICLINE TARTRATE 0.5 MG X 11 & 1 MG X 42 PO TBPK: ORAL_TABLET | ORAL | 0 refills | Status: DC

## 2021-08-07 NOTE — Patient Instructions (Signed)
-   CT Chest in 3 months, PFTs in 3 months on same day as clinic appointment - see instructions on dose pack for chantix to slowly ramp up dose - see you in 3 months!

## 2021-08-30 ENCOUNTER — Other Ambulatory Visit: Payer: Self-pay | Admitting: Physician Assistant

## 2021-09-04 ENCOUNTER — Ambulatory Visit (INDEPENDENT_AMBULATORY_CARE_PROVIDER_SITE_OTHER): Payer: Medicare Other | Admitting: Physician Assistant

## 2021-09-04 ENCOUNTER — Other Ambulatory Visit: Payer: Self-pay

## 2021-09-04 ENCOUNTER — Encounter: Payer: Self-pay | Admitting: Physician Assistant

## 2021-09-04 VITALS — BP 120/74 | HR 97 | Temp 97.0°F | Ht 60.75 in | Wt 201.0 lb

## 2021-09-04 DIAGNOSIS — M79672 Pain in left foot: Secondary | ICD-10-CM

## 2021-09-04 DIAGNOSIS — M79671 Pain in right foot: Secondary | ICD-10-CM

## 2021-09-04 DIAGNOSIS — I10 Essential (primary) hypertension: Secondary | ICD-10-CM

## 2021-09-04 DIAGNOSIS — F17200 Nicotine dependence, unspecified, uncomplicated: Secondary | ICD-10-CM

## 2021-09-04 DIAGNOSIS — R55 Syncope and collapse: Secondary | ICD-10-CM

## 2021-09-04 DIAGNOSIS — Z1211 Encounter for screening for malignant neoplasm of colon: Secondary | ICD-10-CM

## 2021-09-04 DIAGNOSIS — R918 Other nonspecific abnormal finding of lung field: Secondary | ICD-10-CM | POA: Diagnosis not present

## 2021-09-04 DIAGNOSIS — J439 Emphysema, unspecified: Secondary | ICD-10-CM | POA: Diagnosis not present

## 2021-09-04 DIAGNOSIS — E6609 Other obesity due to excess calories: Secondary | ICD-10-CM

## 2021-09-04 DIAGNOSIS — M170 Bilateral primary osteoarthritis of knee: Secondary | ICD-10-CM

## 2021-09-04 DIAGNOSIS — Z6838 Body mass index (BMI) 38.0-38.9, adult: Secondary | ICD-10-CM

## 2021-09-04 NOTE — Progress Notes (Signed)
Subjective:    Patient ID: Veronica Weber, female    DOB: [birth year], 66 y.o.   MRN: [id number]  Chief Complaint  Patient presents with   Follow-up    HPI Patient is in today for f/up from 05/27/21. She has been working hard to get caught up on preventive screenings, as well as following up with specialists about issues she had not addressed due to financial limitation.  She has f/up with Dr. Georgina Snell tomorrow. She had bilateral CSI in both knees and only lasted about 3 weeks. She is hoping to have gel injections next.    She followed up with podiatrist as well, Dr. Amalia Hailey on 06/05/21.   She has not done Cologuard yet, but still plans to do so.   Appt with Neurology went well.  Still following up with cardiology and pulmonology ("mild emphysema"). She is still hoping to try Chantix to quit smoking. Still smoking about 1 ppd.   No dizzy spells in over a year now per patient.   Still scheduling to see dentist and eye doctor this year for regular visits.   Past Medical History:  Diagnosis Date   Arthritis    Cataract    Thyroid disease     Past Surgical History:  Procedure Laterality Date   CATARACT EXTRACTION     1997, [place]    Family History  Problem Relation Age of Onset   Arthritis Mother    Cancer Maternal Grandmother    Diabetes Maternal Grandmother    Diabetes Maternal Grandfather    Healthy Daughter    Healthy Son     Social History   Tobacco Use   Smoking status: Every Day    Packs/day: 0.50    Years: 45.00    Pack years: 22.50    Types: Cigarettes    Start date: 06/01/1976   Smokeless tobacco: Never  Vaping Use   Vaping Use: Never used  Substance Use Topics   Alcohol use: Not Currently   Drug use: Never     No Known Allergies  Review of Systems NEGATIVE UNLESS OTHERWISE INDICATED IN HPI      Objective:     BP 120/74    Pulse 97    Temp (!) 97 F (36.1 C)     Ht 5' 0.75" (1.543 m)    Wt 201 lb (91.2 kg)    SpO2 98%    BMI 38.29 kg/m   Wt Readings from Last 3 Encounters:  09/04/21 201 lb (91.2 kg)  08/07/21 196 lb 3.2 oz (89 kg)  07/09/21 199 lb (90.3 kg)    BP Readings from Last 3 Encounters:  09/04/21 120/74  08/07/21 (!) 142/80  07/09/21 (!) 142/84     Physical Exam Vitals and nursing note reviewed.  Constitutional:      Appearance: Normal appearance. She is obese. She is not toxic-appearing.  HENT:     Head: Normocephalic and atraumatic.     Right Ear: Tympanic membrane, ear canal and external ear normal.     Left Ear: Tympanic membrane, ear canal and external ear normal.     Nose: Nose normal.     Mouth/Throat:     Mouth: Mucous membranes are moist.  Eyes:     Extraocular Movements: Extraocular movements intact.     Conjunctiva/sclera: Conjunctivae normal.     Pupils: Pupils are equal, round, and reactive to light.  Cardiovascular:     Rate and Rhythm: Normal rate and regular rhythm.     Pulses: Normal pulses.     Heart sounds: Normal heart sounds.  Pulmonary:     Effort: Pulmonary effort is normal.     Breath sounds: Normal breath sounds.  Musculoskeletal:        General: Normal range of motion.     Cervical back: Normal range of motion and neck supple.  Skin:    General: Skin is warm and dry.  Neurological:     General: No focal deficit present.     Mental Status: She is alert and oriented to person, place, and time.  Psychiatric:        Mood and Affect: Mood normal.        Behavior: Behavior normal.        Thought Content: Thought content normal.        Judgment: Judgment normal.       Assessment & Plan:   Problem List Items Addressed This Visit   None Visit Diagnoses     Pulmonary emphysema, unspecified emphysema type ([place])    -  Primary   Pulmonary nodules       Current every day smoker       Syncope, unspecified syncope type       Essential hypertension       Osteoarthritis of both knees,  unspecified osteoarthritis type       Bilateral foot pain       Screening for colon cancer       Class 2 obesity due to excess calories without serious comorbidity with body mass index (BMI) of 38.0 to 38.9 in adult           1. Pulmonary emphysema, unspecified emphysema type ([place]) 2. Pulmonary nodules 3. Current every day smoker -She has followed up with pulmonology; next CT in 3 months -She is ready to quit smoking and hopes to start on Chantix soon   4. Syncope, unspecified syncope type -She had eval with neurology and cardiology -Most likely vasovagal syncope - hasn't occurred in more than 1 year per patient -Cardiac event monitor, TTE negative; EEG normal  5. Essential hypertension -To goal; losartan increased to 50 mg daily after visit with Dr. Harl Bowie in Nov  6. Osteoarthritis of both knees, unspecified osteoarthritis type -She will f/up with Dr. Georgina Snell about gel injections  7. Bilateral foot pain -She will cont f/up with Dr. Amalia Hailey  8. Screening for colon cancer -She has Cologuard kit at home and will send this back ASAP; no bowel changes or other concerns expressed by patient   9. Class 2 obesity due to excess calories without serious comorbidity with body mass index (BMI) of 38.0 to 38.9 in adult -She will benefit from weight loss and she is motivated to work on this. -Goal to get under 200 lbs -She is going to cut back, ideally eliminate, sodas; and she is going to walk more as she is able   -Plan to f/up in 6 months   This note was prepared with assistance of Dragon voice recognition software. Occasional wrong-word or sound-a-like substitutions may have occurred due to the inherent limitations of voice recognition software.  Time Spent: 34 minutes of total time was spent on the date of the encounter performing the following actions: chart review prior to seeing the patient, obtaining history, performing a medically necessary exam, counseling on the treatment plan,  placing orders, and documenting in our EHR.  Phebe Dettmer M Zevin Nevares, PA-C

## 2021-09-04 NOTE — Patient Instructions (Signed)
Good to see you!   Just need to get the Cologuard done and sent back please!   Goals with me are to work on weight loss and smoking cessation. Try to cut back on your sodas, ideally eliminate altogether, but at least no more than 1-2 per week. Try to walk to the best of your ability.   Continue f/up with specialists.  Call sooner if any concerns.

## 2021-09-05 ENCOUNTER — Ambulatory Visit: Payer: Medicare Other | Admitting: Family Medicine

## 2021-09-05 NOTE — Progress Notes (Deleted)
° °  I, Philbert Riser, LAT, ATC acting as a scribe for Veronica Graham, MD.  Veronica Weber is a 66 y.o. female who presents to [facility] today for f/u bilat knee pain due to considerable medial compartment DJD. Pt was last seen by Dr. Denyse Weber on 06/03/21 and was given bilat knee steroid injections   Pertinent review of systems: ***  Relevant historical information: ***   Exam:  There were no vitals taken for this visit. General: Well Developed, well nourished, and in no acute distress.   MSK: ***    Lab and Radiology Results No results found for this or any previous visit (from the past 72 hour(s)). No results found.     Assessment and Plan: 66 y.o. female with ***   PDMP not reviewed this encounter. No orders of the defined types were placed in this encounter.  No orders of the defined types were placed in this encounter.    Discussed warning signs or symptoms. Please see discharge instructions. Patient expresses understanding.   ***

## 2021-09-06 ENCOUNTER — Telehealth: Payer: Self-pay

## 2021-09-06 NOTE — Telephone Encounter (Signed)
Spoke to pt to inform her gel shots have been authorize. Pt is already schedule for a visit w/ Dr. Georgina Snell on 1/9.

## 2021-09-09 ENCOUNTER — Other Ambulatory Visit: Payer: Self-pay

## 2021-09-09 ENCOUNTER — Ambulatory Visit: Payer: Self-pay

## 2021-09-09 ENCOUNTER — Ambulatory Visit (INDEPENDENT_AMBULATORY_CARE_PROVIDER_SITE_OTHER): Payer: Medicare Other | Admitting: Family Medicine

## 2021-09-09 ENCOUNTER — Ambulatory Visit: Payer: Medicare Other

## 2021-09-09 DIAGNOSIS — G8929 Other chronic pain: Secondary | ICD-10-CM

## 2021-09-09 DIAGNOSIS — M25562 Pain in left knee: Secondary | ICD-10-CM

## 2021-09-09 DIAGNOSIS — M25561 Pain in right knee: Secondary | ICD-10-CM | POA: Diagnosis not present

## 2021-09-09 DIAGNOSIS — M170 Bilateral primary osteoarthritis of knee: Secondary | ICD-10-CM

## 2021-09-09 NOTE — Progress Notes (Signed)
Veronica Weber presents to clinic today for Gelsyn injection bilateral knees 1/3  Procedure: Real-time Ultrasound Guided Injection of right knee superior lateral patellar space Device: Philips Affiniti 50G Images permanently stored and available for review in PACS Verbal informed consent obtained.  Discussed risks and benefits of procedure. Warned about infection bleeding damage to structures skin hypopigmentation and fat atrophy among others. Patient expresses understanding and agreement Time-out conducted.   Noted no overlying erythema, induration, or other signs of local infection.   Skin prepped in a sterile fashion.   Local anesthesia: Topical Ethyl chloride.   With sterile technique and under real time ultrasound guidance: Gelsyn 16.8 mg injected into knee joint. Fluid seen entering the joint capsule.   Completed without difficulty    Advised to call if fevers/chills, erythema, induration, drainage, or persistent bleeding.   Images permanently stored and available for review in the ultrasound unit.  Impression: Technically successful ultrasound guided injection.    Procedure: Real-time Ultrasound Guided Injection of left knee superior lateral patellar space Device: Philips Affiniti 50G Images permanently stored and available for review in PACS Verbal informed consent obtained.  Discussed risks and benefits of procedure. Warned about infection bleeding damage to structures skin hypopigmentation and fat atrophy among others. Patient expresses understanding and agreement Time-out conducted.   Noted no overlying erythema, induration, or other signs of local infection.   Skin prepped in a sterile fashion.   Local anesthesia: Topical Ethyl chloride.   With sterile technique and under real time ultrasound guidance: Gelsyn 16.8 mg injected into knee joint. Fluid seen entering the joint capsule.   Completed without difficulty    Advised to call if fevers/chills, erythema, induration, drainage, or  persistent bleeding.   Images permanently stored and available for review in the ultrasound unit.  Impression: Technically successful ultrasound guided injection.    Lot number: [id number] for both injections  Return in 1 week for Gelsyn injection bilateral knees 2/3

## 2021-09-09 NOTE — Patient Instructions (Signed)
Good to see you today.  You had B knee Gelsyn injections.  Call or go to the ER if you develop a large red swollen joint with extreme pain or oozing puss.   Please schedule 2 follow-up appointments (1/week for the next 2 weeks) to finish the rest of the [place] series.

## 2021-09-16 ENCOUNTER — Ambulatory Visit: Payer: Self-pay

## 2021-09-16 ENCOUNTER — Ambulatory Visit (INDEPENDENT_AMBULATORY_CARE_PROVIDER_SITE_OTHER): Payer: Medicare Other | Admitting: Family Medicine

## 2021-09-16 ENCOUNTER — Other Ambulatory Visit: Payer: Self-pay

## 2021-09-16 DIAGNOSIS — G8929 Other chronic pain: Secondary | ICD-10-CM | POA: Diagnosis not present

## 2021-09-16 DIAGNOSIS — M170 Bilateral primary osteoarthritis of knee: Secondary | ICD-10-CM | POA: Diagnosis not present

## 2021-09-16 DIAGNOSIS — M25561 Pain in right knee: Secondary | ICD-10-CM

## 2021-09-16 DIAGNOSIS — M25562 Pain in left knee: Secondary | ICD-10-CM

## 2021-09-16 NOTE — Patient Instructions (Signed)
Thank you for coming in today.   You received an injection today. Seek immediate medical attention if the joint becomes red, extremely painful, or is oozing fluid.   See you next week for the 3rd Gelsyn injection.

## 2021-09-16 NOTE — Progress Notes (Signed)
Veronica Weber presents to clinic today for Gelsyn injection BL knee 2/3  Procedure: Real-time Ultrasound Guided Injection of right knee superior lateral patellar space Device: Philips Affiniti 50G Images permanently stored and available for review in PACS Verbal informed consent obtained.  Discussed risks and benefits of procedure. Warned about infection bleeding damage to structures skin hypopigmentation and fat atrophy among others. Patient expresses understanding and agreement Time-out conducted.   Noted no overlying erythema, induration, or other signs of local infection.   Skin prepped in a sterile fashion.   Local anesthesia: Topical Ethyl chloride.   With sterile technique and under real time ultrasound guidance: Gelsyn 16.8 mg injected into the joint. Fluid seen entering the joint capsule.   Completed without difficulty     Advised to call if fevers/chills, erythema, induration, drainage, or persistent bleeding.   Images permanently stored and available for review in the ultrasound unit.  Impression: Technically successful ultrasound guided injection.   Procedure: Real-time Ultrasound Guided Injection of left knee superior lateral patellar space Device: Philips Affiniti 50G Images permanently stored and available for review in PACS Verbal informed consent obtained.  Discussed risks and benefits of procedure. Warned about infection bleeding damage to structures skin hypopigmentation and fat atrophy among others. Patient expresses understanding and agreement Time-out conducted.   Noted no overlying erythema, induration, or other signs of local infection.   Skin prepped in a sterile fashion.   Local anesthesia: Topical Ethyl chloride.   With sterile technique and under real time ultrasound guidance: Gelsyn 16.8 mg injected into knee joint. Fluid seen entering the joint capsule.   Completed without difficulty    Advised to call if fevers/chills, erythema, induration, drainage, or persistent  bleeding.   Images permanently stored and available for review in the ultrasound unit.  Impression: Technically successful ultrasound guided injection.   Lot number [id number] for both injections  Return in 1 week for Gelsyn injection 3/3

## 2021-09-23 ENCOUNTER — Other Ambulatory Visit: Payer: Self-pay

## 2021-09-23 ENCOUNTER — Ambulatory Visit (INDEPENDENT_AMBULATORY_CARE_PROVIDER_SITE_OTHER): Payer: Medicare Other | Admitting: Family Medicine

## 2021-09-23 ENCOUNTER — Ambulatory Visit: Payer: Self-pay

## 2021-09-23 VITALS — Ht 61.0 in

## 2021-09-23 DIAGNOSIS — G8929 Other chronic pain: Secondary | ICD-10-CM

## 2021-09-23 DIAGNOSIS — M25561 Pain in right knee: Secondary | ICD-10-CM

## 2021-09-23 DIAGNOSIS — M25562 Pain in left knee: Secondary | ICD-10-CM | POA: Diagnosis not present

## 2021-09-23 DIAGNOSIS — M170 Bilateral primary osteoarthritis of knee: Secondary | ICD-10-CM

## 2021-09-23 NOTE — Progress Notes (Signed)
Veronica Weber presents to clinic today for Gelsyn injection bilateral knees 3/3  Procedure: Real-time Ultrasound Guided Injection of right knee superior lateral patellar space Device: Philips Affiniti 50G Images permanently stored and available for review in PACS Verbal informed consent obtained.  Discussed risks and benefits of procedure. Warned about infection bleeding damage to structures skin hypopigmentation and fat atrophy among others. Patient expresses understanding and agreement Time-out conducted.   Noted no overlying erythema, induration, or other signs of local infection.   Skin prepped in a sterile fashion.   Local anesthesia: Topical Ethyl chloride.   With sterile technique and under real time ultrasound guidance: Gelsyn 16.8 mg injected into knee joint. Fluid seen entering the joint capsule.   Completed without difficulty   Advised to call if fevers/chills, erythema, induration, drainage, or persistent bleeding.   Images permanently stored and available for review in the ultrasound unit.  Impression: Technically successful ultrasound guided injection.    Procedure: Real-time Ultrasound Guided Injection of left knee superior lateral patellar space Device: Philips Affiniti 50G Images permanently stored and available for review in PACS Verbal informed consent obtained.  Discussed risks and benefits of procedure. Warned about infection bleeding damage to structures skin hypopigmentation and fat atrophy among others. Patient expresses understanding and agreement Time-out conducted.   Noted no overlying erythema, induration, or other signs of local infection.   Skin prepped in a sterile fashion.   Local anesthesia: Topical Ethyl chloride.   With sterile technique and under real time ultrasound guidance: Gelsyn 16.8 mg injected into knee joint. Fluid seen entering the joint capsule.   Completed without difficulty     Advised to call if fevers/chills, erythema,  induration, drainage, or persistent bleeding.   Images permanently stored and available for review in the ultrasound unit.  Impression: Technically successful ultrasound guided injection.  Lot number: [id number] for both injections   Return as needed  Veronica Weber is not feeling much better at all.  We discussed her situation and after discussion we will refer to orthopedic surgery for surgery consultation for total knee replacement.  Her left knee is worse than her right so that may be the first approach.  Her BMI is currently around 38 so she is just under the limit for knee replacement.  Total encounter time 20 minutes including face-to-face time with the patient and, reviewing past medical record, and charting on the date of service.   Time-based billing excludes time perform injection.  Time-based billing was for discussion regarding next steps

## 2021-09-23 NOTE — Patient Instructions (Addendum)
Good to see you today.  You had B knee Gelsyn injections.  Call or go to the ER if you develop a large red swollen joint with extreme pain or oozing puss.   Follow-up as needed

## 2021-10-02 ENCOUNTER — Other Ambulatory Visit: Payer: Self-pay

## 2021-10-02 ENCOUNTER — Ambulatory Visit
Admission: RE | Admit: 2021-10-02 | Discharge: 2021-10-02 | Disposition: A | Discharge status: Home / Self Care | Payer: Medicare Other | Source: Ambulatory Visit | Attending: Student | Admitting: Student

## 2021-10-02 DIAGNOSIS — R918 Other nonspecific abnormal finding of lung field: Secondary | ICD-10-CM

## 2021-10-02 DIAGNOSIS — F17210 Nicotine dependence, cigarettes, uncomplicated: Secondary | ICD-10-CM

## 2021-10-02 DIAGNOSIS — F17200 Nicotine dependence, unspecified, uncomplicated: Secondary | ICD-10-CM

## 2021-10-07 ENCOUNTER — Ambulatory Visit: Payer: Medicare Other | Admitting: Orthopedic Surgery

## 2021-10-07 ENCOUNTER — Ambulatory Visit: Payer: Self-pay

## 2021-10-07 ENCOUNTER — Other Ambulatory Visit: Payer: Self-pay

## 2021-10-07 VITALS — Ht 60.75 in | Wt 201.0 lb

## 2021-10-07 DIAGNOSIS — M545 Low back pain, unspecified: Secondary | ICD-10-CM | POA: Diagnosis not present

## 2021-10-13 ENCOUNTER — Encounter: Payer: Self-pay | Admitting: Orthopedic Surgery

## 2021-10-13 NOTE — Progress Notes (Signed)
Office Visit Note   Patient: Veronica Weber           Date of Birth: [birth year]           MRN: [id number] Visit Date: 10/07/2021 Requested by: Rodolph Bong, MD [address] [place],  [place] [address] PCP: Bary Leriche, PA-C  Subjective: Chief Complaint  Patient presents with   Left Knee - Pain   Right Knee - Pain    HPI: Veronica Weber is a 66 y.o. female who presents to the office complaining of bilateral knee pain.  .  She reports left and right knee pain for years with the left knee bothering her more.  Pain has been worse recently without any new injuries or falls.  Denies any locking or mechanical symptoms.  Both knees feel like they want to give out on her at times but no frank instability.  She has no history of surgery on either knee.  Denies any groin pain but does report low back pain since a fall last year.  She is also had radicular pain in the right leg with numbness and tingling into her toes in the right leg.  No history of diabetes.  She does have history of smoking 15 to 20 cigarettes/day.  She tried cortisone injection for her knee pain that provided relief for about 3 weeks and she also tried gel injections recently without any relief.  She takes Tylenol and ibuprofen with no relief.  She has history of cardiovascular evaluation due to a syncopal episode.  Her cardiologist is Dr. Carolan Clines.  She does not take any blood thinners.  No personal history of DVT/PE but she states her cousin has had a DVT.  She works at [employer].  She has 5  stairs to get into her house and her daughter is visiting in April.  Since a fall last year she has had low back pain with increasingly common radicular pain down the right leg that extends down to about the ankle/top of the foot.  She also has numbness and tingling in her toes on the right foot.  Denies any saddle anesthesia but does report urinary incontinence since the fall where she feels she can control as  her bladder empties or she will notice some white spots in her depends that she did not realize that she had gone..                ROS: All systems reviewed are negative as they relate to the chief complaint within the history of present illness.  Patient denies fevers or chills.  Assessment & Plan: Visit Diagnoses:  1. Low back pain, unspecified back pain laterality, unspecified chronicity, unspecified whether sciatica present     Plan: Patient is a 66 year old female who presents for evaluation of bilateral knee pain and low back pain.  She is referred here for surgical discussion with history of bilateral knee osteoarthritis.  She has had mild relief for 3 weeks from cortisone injections and no relief from gel injections.  She is interested in knee replacement.  This was discussed with her today as far as the risks and benefits of the procedure as well as recovery timeframe.  Additionally, with her complaint of new onset low back pain since a fall in last year and radicular pain she has been complaining about, plan to order MRI of the lumbar spine for further evaluation after radiographs today revealed concern for a compression fracture as well  as multilevel degenerative disc disease..  Follow-up after MRI to review results and discuss more about next steps in the future regarding knee replacement.  Follow-Up Instructions: No follow-ups on file.   Orders:  Orders Placed This Encounter  Procedures   XR Lumbar Spine 2-3 Views   No orders of the defined types were placed in this encounter.     Procedures: No procedures performed   Clinical Data: No additional findings.  Objective: Vital Signs: Ht 5' 0.75" (1.543 m)    Wt 201 lb (91.2 kg)    BMI 38.29 kg/m   Physical Exam:  Constitutional: Patient appears well-developed HEENT:  Head: Normocephalic Eyes:EOM are normal Neck: Normal range of motion Cardiovascular: Normal rate Pulmonary/chest: Effort normal Neurologic: Patient is  alert Skin: Skin is warm Psychiatric: Patient has normal mood and affect  Ortho Exam: Ortho exam demonstrates right knee with 5 degrees extension and 110 degrees of knee flexion.  Left knee with 10 degrees extension and 110 degrees of knee flexion.  No significant effusion noted.  No calf tenderness.  Negative Homans' sign.  No pain with hip range of motion.  Tenderness over the medial lateral joint lines of both knees.  Patient is able to perform straight leg raise with both legs.  Positive straight leg raise on the right leg.  Negative on the left.  5/5 motor strength of bilateral hip flexion, quadricep, hamstring, dorsiflexion, plantarflexion.  Tenderness over the axial lumbar spine and right-sided paraspinal musculature.  Specialty Comments:  No specialty comments available.  Imaging: No results found.   PMFS History: Patient Active Problem List   Diagnosis Date Noted   Syncope and collapse 06/26/2021   Past Medical History:  Diagnosis Date   Arthritis    Cataract    Thyroid disease     Family History  Problem Relation Age of Onset   Arthritis Mother    Cancer Maternal Grandmother    Diabetes Maternal Grandmother    Diabetes Maternal Grandfather    Healthy Daughter    Healthy Son     Past Surgical History:  Procedure Laterality Date   CATARACT EXTRACTION     1997, 1998   CESAREAN SECTION  1983   CHOLECYSTECTOMY  1998   TUBAL LIGATION  1994   Social History   Occupational History   Not on file  Tobacco Use   Smoking status: Every Day    Packs/day: 0.50    Years: 45.00    Pack years: 22.50    Types: Cigarettes    Start date: 06/01/1976   Smokeless tobacco: Never  Vaping Use   Vaping Use: Never used  Substance and Sexual Activity   Alcohol use: Not Currently   Drug use: Never   Sexual activity: Not Currently

## 2021-10-14 ENCOUNTER — Encounter: Payer: Self-pay | Admitting: Orthopedic Surgery

## 2021-10-15 ENCOUNTER — Encounter: Payer: Self-pay | Admitting: Internal Medicine

## 2021-10-15 ENCOUNTER — Other Ambulatory Visit: Payer: Self-pay

## 2021-10-15 ENCOUNTER — Ambulatory Visit: Payer: Medicare Other | Admitting: Internal Medicine

## 2021-10-15 VITALS — BP 150/84 | HR 95 | Ht 60.0 in | Wt 206.4 lb

## 2021-10-15 DIAGNOSIS — Z79899 Other long term (current) drug therapy: Secondary | ICD-10-CM

## 2021-10-15 LAB — LIPID PANEL
Chol/HDL Ratio: 2.8 ratio (ref 0.0–4.4)
Cholesterol, Total: 186 mg/dL (ref 100–199)
HDL: 66 mg/dL (ref >39)
LDL Chol Calc (NIH): 102 mg/dL — ABNORMAL HIGH (ref 0–99)
Triglycerides: 102 mg/dL (ref 0–149)
VLDL Cholesterol Cal: 18 mg/dL (ref 5–40)

## 2021-10-15 MED ORDER — CHLORTHALIDONE 25 MG PO TABS: 25.0000 mg | ORAL_TABLET | Freq: Every day | ORAL | 3 refills | Status: DC

## 2021-10-15 NOTE — Progress Notes (Signed)
Cardiology Office Note:    Date:  10/15/2021   ID:  [name], DOB 1956/07/31, MRN [id number]  PCP:  Allwardt, Randa Evens, PA-C   [facility] Providers Cardiologist:  None     Referring MD: Fredirick Lathe, PA-C   No chief complaint on file. Syncope  History of Present Illness:    Veronica Weber is a 66 y.o. female with a hx of arthritis, thyroid dx, tubal ligation, cholesystectomy, who presents with syncope  She saw neurology for syncope 06/26/2021. She noted an 8 year hx of syncope last 09/02/2020. She developed a funny sensation like a tingling in her nose. She developed pressure in her head. She had some palpitations and diaphoresis. It felt like a panic attack. She was LH and lost consciousness. She notes in the past she was tired 5-6 years ago and she was driving. She says the symptoms hit her and she has to take deep breaths. She has this prodrome for each event. No seizure symptoms. She has these episodes every 6 months or so. She was ordered a routine EEG and will follow up with them. She was told it was normal. She denies family hx of SCD. She has no known cardiac disease. She is a 45 year smoker. Her SBP 140s-160s, DBP < 100s. Her father had stents. Her mother has a heart murmur.  No siblings. She has two children, they are healthy. When doing her day to day job she feels no chest pain or SOB. She has not had any stress test or LHC.   Prior EKG- NSR, LAE, poor r wave progression  CT scan for lung cancer screening CT lung 06/28/2021 Cardiovascular: Heart size is normal. There is no significant pericardial fluid, thickening or pericardial calcification. There is aortic atherosclerosis, as well as atherosclerosis of the great vessels of the mediastinum and the coronary arteries, including calcified atherosclerotic plaque in the left main, left anterior descending and right coronary arteries.  Interim: No syncopal episodes. She started atorvastatin.  She is tolerating that well. She has not taken her blood pressures at home. No angina. No dyspnea.   Past Medical History:  Diagnosis Date   Arthritis    Cataract    Thyroid disease     Past Surgical History:  Procedure Laterality Date   CATARACT EXTRACTION     1997, [place]    Current Medications: No outpatient medications have been marked as taking for the 10/15/21 encounter (Appointment) with Janina Mayo, MD.     Allergies:   Patient has no known allergies.   Social History   Socioeconomic History   Marital status: Divorced    Spouse name: Not on file   Number of children: 2   Years of education: Not on file   Highest education level: Some college, no degree  Occupational History   Not on file  Tobacco Use   Smoking status: Every Day    Packs/day: 0.50    Years: 45.00    Pack years: 22.50    Types: Cigarettes    Start date: 06/01/1976   Smokeless tobacco: Never  Vaping Use   Vaping Use: Never used  Substance and Sexual Activity   Alcohol use: Not Currently   Drug use: Never   Sexual activity: Not Currently  Other Topics Concern   Not on file  Social History Narrative   Lives alone   Caffeine-  coffee 1 -2 c daily   Social Determinants of Health   Financial Resource Strain: Not on file  Food Insecurity: Not on file  Transportation Needs: Not on file  Physical Activity: Not on file  Stress: Not on file  Social Connections: Not on file     Family History: The patient's family history includes Arthritis in her mother; Cancer in her maternal grandmother; Diabetes in her maternal grandfather and maternal grandmother; Healthy in her daughter and son.  ROS:   Please see the history of present illness.     All other systems reviewed and are negative.  EKGs/Labs/Other Studies Reviewed:    The following studies were reviewed today:   EKG:  EKG is  ordered today.  The ekg ordered  today demonstrates   NSR, anteriorly/ poor R wave progression, no acute ischemic changes  Recent Labs: 10/15/2020: ALT 12; BUN 13; Creatinine, Ser 0.69; Hemoglobin 14.4; Platelets 307.0; Potassium 4.3; Sodium 139; TSH 3.21  Recent Lipid Panel    Component Value Date/Time   CHOL 251 (H) 07/09/2021 1106   TRIG 148 07/09/2021 1106   HDL 51 07/09/2021 1106   CHOLHDL 4.9 (H) 07/09/2021 1106   LDLCALC 173 (H) 07/09/2021 1106     Risk Assessment/Calculations:     The 10-year ASCVD risk score (Arnett DK, et al., 2019) is: 10.4%   Values used to calculate the score:     Age: 27 years     Sex: Female     Is Non-Hispanic African American: No     Diabetic: No     Tobacco smoker: Yes     Systolic Blood Pressure: 123456 mmHg     Is BP treated: No     HDL Cholesterol: 51 mg/dL     Total Cholesterol: 251 mg/dL   Physical Exam:    VS:   There were no vitals filed for this visit.    Wt Readings from Last 3 Encounters:  10/07/21 201 lb (91.2 kg)  09/04/21 201 lb (91.2 kg)  08/07/21 196 lb 3.2 oz (89 kg)     GEN:  Well nourished, well developed in no acute distress HEENT: Normal NECK: No JVD; No carotid bruits LYMPHATICS: No lymphadenopathy CARDIAC: RRR, no murmurs, rubs, gallops RESPIRATORY:  Clear to auscultation without rales, wheezing or rhonchi  ABDOMEN: Soft, non-tender, non-distended MUSCULOSKELETAL:  No edema; No deformity  SKIN: Warm and dry NEUROLOGIC:  Alert and oriented x 3 PSYCHIATRIC:  Normal affect   ASSESSMENT:   #Syncope: she has a prodrome c/f vasovagal syncope. She had a work up with cardiac monitor that showed sinus rhythm/sinus tachycardia. No significant tachyarrhythmia or bradyarrhythmia. No atrial fibrillation or flutter. Her echo was normal. Recommend knowing her triggers for vasovagal syncope and staying hydrated.  #Atherosclerosis: she has diffuse calcification in the aorta, coronary arteries notable LM and LAD.  -continue atorvastatin 40 mg daily -  ASCVD 18.8% - LDL 173 mg/dL, TC 251 mg/dL, HDL 51 mg/dL 07/09/2021 - will get lipid profile today; will uptitrate statin if not at goal, can be followed by primary provider moving forward - LDL goal < 100 mg/dL   #HTN: her blood pressure is not at goal; ideally < 130/80 mmHg. Will continue losartan to 50 mg daily. Add chlorrthalidone 25 mg daily. Can uptitrate her losartan to meet goal. She was also instructed to monitor her Bps at home.  #Smoking Cessation: She has not picked up Chantix.  Considering her management moving forward is preventative, will plan for her to  follow-up as needed, if she develops a cardiac event.   PLAN:    In order of problems listed above:  Add chlorthalidone  Lipid profile Follow up as needed        Medication Adjustments/Labs and Tests Ordered: Current medicines are reviewed at length with the patient today.  Concerns regarding medicines are outlined above.   Signed, Janina Mayo, MD  10/15/2021 8:00 AM    [place]

## 2021-10-15 NOTE — Patient Instructions (Signed)
Medication Instructions:  START: CHLORTHALIDONE 25mg  ONCE DAILY  *If you need a refill on your cardiac medications before your next appointment, please call your pharmacy*  Lab Work: TODAY- Please return for Blood Work in 2 WEEKS. No appointment needed, lab here at the office is open Monday-Friday from 8AM to 4PM and closed daily for lunch from 12:45-1:45.   If you have labs (blood work) drawn today and your tests are completely normal, you will receive your results only by: MyChart Message (if you have MyChart) OR A paper copy in the mail If you have any lab test that is abnormal or we need to change your treatment, we will call you to review the results.  Follow-Up: At [facility], you and your health needs are our priority.  As part of our continuing mission to provide you with exceptional heart care, we have created designated Provider Care Teams.  These Care Teams include your primary Cardiologist (physician) and Advanced Practice Providers (APPs -  Physician Assistants and Nurse Practitioners) who all work together to provide you with the care you need, when you need it.  Your next appointment:   AS NEEDED   The format for your next appointment:   In Person  Provider:   [facility], MD

## 2021-11-06 ENCOUNTER — Other Ambulatory Visit: Payer: Self-pay

## 2021-11-06 ENCOUNTER — Ambulatory Visit
Admission: RE | Admit: 2021-11-06 | Discharge: 2021-11-06 | Disposition: A | Discharge status: Home / Self Care | Payer: Medicare Other | Source: Ambulatory Visit | Attending: Surgical | Admitting: Surgical

## 2021-11-06 DIAGNOSIS — Z79899 Other long term (current) drug therapy: Secondary | ICD-10-CM

## 2021-11-06 DIAGNOSIS — M545 Low back pain, unspecified: Secondary | ICD-10-CM

## 2021-11-07 LAB — BASIC METABOLIC PANEL
BUN/Creatinine Ratio: 29 — ABNORMAL HIGH (ref 12–28)
BUN: 24 mg/dL (ref 8–27)
CO2: 27 mmol/L (ref 20–29)
Calcium: 10.4 mg/dL — ABNORMAL HIGH (ref 8.7–10.3)
Chloride: 97 mmol/L (ref 96–106)
Creatinine, Ser: 0.82 mg/dL (ref 0.57–1.00)
Glucose: 91 mg/dL (ref 70–99)
Potassium: 4.3 mmol/L (ref 3.5–5.2)
Sodium: 138 mmol/L (ref 134–144)
eGFR: 79 mL/min/{1.73_m2} (ref >59)

## 2021-11-13 ENCOUNTER — Ambulatory Visit: Payer: Medicare Other | Admitting: Orthopedic Surgery

## 2021-11-16 ENCOUNTER — Encounter (HOSPITAL_COMMUNITY): Payer: Self-pay

## 2021-11-16 ENCOUNTER — Other Ambulatory Visit: Payer: Self-pay

## 2021-11-16 ENCOUNTER — Emergency Department (HOSPITAL_COMMUNITY): Payer: Medicare Other

## 2021-11-16 ENCOUNTER — Inpatient Hospital Stay (HOSPITAL_COMMUNITY)
Admission: EM | Admit: 2021-11-16 | Discharge: 2021-11-19 | DRG: 065 | Disposition: A | Discharge status: Home Health | Payer: Medicare Other | Attending: Family Medicine | Admitting: Family Medicine

## 2021-11-16 DIAGNOSIS — Z20822 Contact with and (suspected) exposure to covid-19: Secondary | ICD-10-CM | POA: Diagnosis present

## 2021-11-16 DIAGNOSIS — E66813 Obesity, class 3: Secondary | ICD-10-CM

## 2021-11-16 DIAGNOSIS — G255 Other chorea: Secondary | ICD-10-CM | POA: Diagnosis not present

## 2021-11-16 DIAGNOSIS — Z7982 Long term (current) use of aspirin: Secondary | ICD-10-CM

## 2021-11-16 DIAGNOSIS — I639 Cerebral infarction, unspecified: Secondary | ICD-10-CM | POA: Diagnosis not present

## 2021-11-16 DIAGNOSIS — Z602 Problems related to living alone: Secondary | ICD-10-CM | POA: Diagnosis present

## 2021-11-16 DIAGNOSIS — E7800 Pure hypercholesterolemia, unspecified: Secondary | ICD-10-CM | POA: Diagnosis present

## 2021-11-16 DIAGNOSIS — Z720 Tobacco use: Secondary | ICD-10-CM | POA: Diagnosis present

## 2021-11-16 DIAGNOSIS — F419 Anxiety disorder, unspecified: Secondary | ICD-10-CM | POA: Diagnosis present

## 2021-11-16 DIAGNOSIS — I6340 Cerebral infarction due to embolism of unspecified cerebral artery: Secondary | ICD-10-CM | POA: Insufficient documentation

## 2021-11-16 DIAGNOSIS — F17210 Nicotine dependence, cigarettes, uncomplicated: Secondary | ICD-10-CM | POA: Diagnosis present

## 2021-11-16 DIAGNOSIS — Z833 Family history of diabetes mellitus: Secondary | ICD-10-CM

## 2021-11-16 DIAGNOSIS — Z6841 Body Mass Index (BMI) 40.0 and over, adult: Secondary | ICD-10-CM

## 2021-11-16 DIAGNOSIS — E039 Hypothyroidism, unspecified: Secondary | ICD-10-CM | POA: Diagnosis present

## 2021-11-16 DIAGNOSIS — M170 Bilateral primary osteoarthritis of knee: Secondary | ICD-10-CM | POA: Diagnosis present

## 2021-11-16 DIAGNOSIS — E785 Hyperlipidemia, unspecified: Secondary | ICD-10-CM | POA: Diagnosis present

## 2021-11-16 DIAGNOSIS — Z23 Encounter for immunization: Secondary | ICD-10-CM

## 2021-11-16 DIAGNOSIS — Z8261 Family history of arthritis: Secondary | ICD-10-CM

## 2021-11-16 DIAGNOSIS — I63513 Cerebral infarction due to unspecified occlusion or stenosis of bilateral middle cerebral arteries: Principal | ICD-10-CM | POA: Diagnosis present

## 2021-11-16 DIAGNOSIS — Z79899 Other long term (current) drug therapy: Secondary | ICD-10-CM

## 2021-11-16 DIAGNOSIS — R55 Syncope and collapse: Secondary | ICD-10-CM | POA: Diagnosis present

## 2021-11-16 DIAGNOSIS — Z8673 Personal history of transient ischemic attack (TIA), and cerebral infarction without residual deficits: Secondary | ICD-10-CM

## 2021-11-16 DIAGNOSIS — G9389 Other specified disorders of brain: Secondary | ICD-10-CM | POA: Diagnosis present

## 2021-11-16 DIAGNOSIS — I10 Essential (primary) hypertension: Secondary | ICD-10-CM | POA: Diagnosis present

## 2021-11-16 HISTORY — DX: Tobacco use: Z72.0

## 2021-11-16 HISTORY — DX: Essential (primary) hypertension: I10

## 2021-11-16 HISTORY — DX: Hyperlipidemia, unspecified: E78.5

## 2021-11-16 HISTORY — DX: Personal history of transient ischemic attack (TIA), and cerebral infarction without residual deficits: Z86.73

## 2021-11-16 LAB — COMPREHENSIVE METABOLIC PANEL
ALT: 19 U/L (ref 0–44)
AST: 19 U/L (ref 15–41)
Albumin: 3.8 g/dL (ref 3.5–5.0)
Alkaline Phosphatase: 105 U/L (ref 38–126)
Anion gap: 9 (ref 5–15)
BUN: 23 mg/dL (ref 8–23)
CO2: 28 mmol/L (ref 22–32)
Calcium: 9.8 mg/dL (ref 8.9–10.3)
Chloride: 101 mmol/L (ref 98–111)
Creatinine, Ser: 0.87 mg/dL (ref 0.44–1.00)
GFR, Estimated: >60 mL/min (ref >60)
Glucose, Bld: 102 mg/dL — ABNORMAL HIGH (ref 70–99)
Potassium: 4 mmol/L (ref 3.5–5.1)
Sodium: 138 mmol/L (ref 135–145)
Total Bilirubin: 0.5 mg/dL (ref 0.3–1.2)
Total Protein: 6.7 g/dL (ref 6.5–8.1)

## 2021-11-16 LAB — CBC WITH DIFFERENTIAL/PLATELET
Abs Immature Granulocytes: 0.01 10*3/uL (ref 0.00–0.07)
Basophils Absolute: 0 10*3/uL (ref 0.0–0.1)
Basophils Relative: 1 %
Eosinophils Absolute: 0.2 10*3/uL (ref 0.0–0.5)
Eosinophils Relative: 3 %
HCT: 41.7 % (ref 36.0–46.0)
Hemoglobin: 13.7 g/dL (ref 12.0–15.0)
Immature Granulocytes: 0 %
Lymphocytes Relative: 31 %
Lymphs Abs: 2.3 10*3/uL (ref 0.7–4.0)
MCH: 30.9 pg (ref 26.0–34.0)
MCHC: 32.9 g/dL (ref 30.0–36.0)
MCV: 94.1 fL (ref 80.0–100.0)
Monocytes Absolute: 0.5 10*3/uL (ref 0.1–1.0)
Monocytes Relative: 7 %
Neutro Abs: 4.5 10*3/uL (ref 1.7–7.7)
Neutrophils Relative %: 58 %
Platelets: 324 10*3/uL (ref 150–400)
RBC: 4.43 MIL/uL (ref 3.87–5.11)
RDW: 12.3 % (ref 11.5–15.5)
WBC: 7.6 10*3/uL (ref 4.0–10.5)
nRBC: 0 % (ref 0.0–0.2)

## 2021-11-16 LAB — RESP PANEL BY RT-PCR (FLU A&B, COVID) ARPGX2
Influenza A by PCR: NEGATIVE
Influenza B by PCR: NEGATIVE
SARS Coronavirus 2 by RT PCR: NEGATIVE

## 2021-11-16 LAB — ETHANOL: Alcohol, Ethyl (B): <10 mg/dL

## 2021-11-16 LAB — PROTIME-INR
INR: 0.9 (ref 0.8–1.2)
Prothrombin Time: 12.4 s (ref 11.4–15.2)

## 2021-11-16 LAB — APTT: aPTT: 29 s (ref 24–36)

## 2021-11-16 MED ORDER — SENNOSIDES-DOCUSATE SODIUM 8.6-50 MG PO TABS: 1.0000 | ORAL_TABLET | Freq: Every evening | ORAL | Status: DC | PRN

## 2021-11-16 MED ORDER — ACETAMINOPHEN 325 MG PO TABS
650.0000 mg | ORAL_TABLET | ORAL | Status: DC | PRN
  Administered 2021-11-17 – 2021-11-18 (×2): 650 mg via ORAL
  Filled 2021-11-16 (×2): qty 2

## 2021-11-16 MED ORDER — NICOTINE 21 MG/24HR TD PT24
21.0000 mg | MEDICATED_PATCH | Freq: Every day | TRANSDERMAL | Status: DC
  Administered 2021-11-16 – 2021-11-19 (×4): 21 mg via TRANSDERMAL
  Filled 2021-11-16 (×4): qty 1

## 2021-11-16 MED ORDER — ACETAMINOPHEN 160 MG/5ML PO SOLN: 650.0000 mg | ORAL | Status: DC | PRN

## 2021-11-16 MED ORDER — ALBUTEROL SULFATE (2.5 MG/3ML) 0.083% IN NEBU: 3.0000 mL | INHALATION_SOLUTION | Freq: Four times a day (QID) | RESPIRATORY_TRACT | Status: DC | PRN

## 2021-11-16 MED ORDER — BENZTROPINE MESYLATE 1 MG/ML IJ SOLN
1.0000 mg | Freq: Once | INTRAMUSCULAR | Status: AC
  Administered 2021-11-16: 1 mg via INTRAMUSCULAR
  Filled 2021-11-16: qty 1

## 2021-11-16 MED ORDER — ATORVASTATIN CALCIUM 40 MG PO TABS: 40.0000 mg | ORAL_TABLET | Freq: Every day | ORAL | Status: DC

## 2021-11-16 MED ORDER — STROKE: EARLY STAGES OF RECOVERY BOOK
Freq: Once | Status: AC
  Filled 2021-11-16: qty 1

## 2021-11-16 MED ORDER — PNEUMOCOCCAL 20-VAL CONJ VACC 0.5 ML IM SUSY
0.5000 mL | PREFILLED_SYRINGE | INTRAMUSCULAR | Status: AC
  Administered 2021-11-17: 0.5 mL via INTRAMUSCULAR
  Filled 2021-11-16: qty 0.5

## 2021-11-16 MED ORDER — ENOXAPARIN SODIUM 40 MG/0.4ML IJ SOSY
40.0000 mg | PREFILLED_SYRINGE | INTRAMUSCULAR | Status: DC
  Administered 2021-11-16 – 2021-11-18 (×3): 40 mg via SUBCUTANEOUS
  Filled 2021-11-16 (×3): qty 0.4

## 2021-11-16 MED ORDER — ACETAMINOPHEN 650 MG RE SUPP: 650.0000 mg | RECTAL | Status: DC | PRN

## 2021-11-16 MED ORDER — ASPIRIN EC 81 MG PO TBEC
81.0000 mg | DELAYED_RELEASE_TABLET | Freq: Every day | ORAL | Status: DC
  Administered 2021-11-16 – 2021-11-18 (×3): 81 mg via ORAL
  Filled 2021-11-16 (×3): qty 1

## 2021-11-16 NOTE — Hospital Course (Signed)
Veronica Weber is a 66 y.o. 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  Neurology recommending stroke work-up. ?

## 2021-11-16 NOTE — Assessment & Plan Note (Signed)
Holding home chlorthalidone and losartan to allow for permissive hypertension for now. ?

## 2021-11-16 NOTE — ED Provider Triage Note (Signed)
Emergency Medicine Provider Triage Evaluation Note ? ?Veronica Weber , a 66 y.o. female  was evaluated in triage.  Pt complains of uncontrollable left arm and left leg tremors. He states that same has been ongoing since she woke up yesterday morning.  She states that it has been worsening since then.  Only present on the left side with no face involvement.  Denies being on any antipsychotic medications or any new medication changes, but does state that she took a tramadol Thursday evening for the first time.  States that she did have a headache yesterday but states that she has headaches regularly and it felt the same as her baseline headache. Endorses only mild headache today.  Denies fevers, chills, nausea, vomiting, diarrhea. ? ?Review of Systems  ?Positive:  ?Negative: See above ? ?Physical Exam  ?BP (!) 129/96 (BP Location: Left Arm)   Pulse 93   Temp (!) 97.3 ?F (36.3 ?C) (Oral)   Resp 16   SpO2 95%  ?Gen:   Awake, no distress   ?Resp:  Normal effort  ?MSK:   Left side with persistent uncontrollable movements. Neurologically intact and alert and oriented ?Other:   ? ?Medical Decision Making  ?Medically screening exam initiated at 2:38 PM.  Appropriate orders placed.  AVORY RAHIMI was informed that the remainder of the evaluation will be completed by another provider, this initial triage assessment does not replace that evaluation, and the importance of remaining in the ED until their evaluation is complete. ? ?Work-up initiated. No neuro symptoms ?  ?Silva Bandy, PA-C ?11/16/21 1446 ? ?

## 2021-11-16 NOTE — Assessment & Plan Note (Signed)
Continue atorvastatin

## 2021-11-16 NOTE — ED Triage Notes (Signed)
Pt reports taking one tramadol on Thursday night before bed and woke up Friday morning with uncontrollable L arm and L leg tremors. Pt reports feeling increasingly anxious.  ?

## 2021-11-16 NOTE — Assessment & Plan Note (Signed)
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 CT head negative for acute intracranial abnormality.  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  Concern is for acute CVA versus other movement disorder.  Does not appear to be on any medications high risk for drug-induced chorea. ?-Neurology consulted, recommended admission for stroke work-up ?-Obtain MRI brain ?-Carotid Dopplers ?-Echocardiogram ?-Monitor on telemetry, continue neurochecks ?-Start on aspirin 81 mg daily ?-Continue atorvastatin 40 mg daily ?-Check A1c, lipid panel ?-Allow permissive hypertension for now ?

## 2021-11-16 NOTE — Assessment & Plan Note (Addendum)
Patient is smoking 1 pack/day for at least 45 years.  Smoking cessation advised.  Nicotine patch provided.  She is planning on starting Chantix as prescribed by her PCP but has not yet picked it up ?

## 2021-11-16 NOTE — ED Provider Notes (Signed)
?[facility] EMERGENCY DEPARTMENT ?Provider Note ? ? ?CSN: [id number] ?Arrival date & time: 11/16/21  1418 ? ?  ? ?History ? ?Chief Complaint  ?Patient presents with  ? Tremors  ? Anxiety  ? ? ?Veronica Weber is a 66 y.o. female. ? ? ?Anxiety ? ? ?The patient is a 66 year old female, she is taking medication for hypertension including chlorthalidone and Cozaar as well as hypercholesterolemia taking Lipitor, she takes the occasional tramadol when she had has increasing amounts of pain.  The patient states that after taking a tramadol on Thursday she woke up on Friday with these abnormal movements of her left arm and the left leg which seem to be uncontrollable.  She has been able to walk but it makes it difficult because of the fidgeting and the uncontrollable nature of the movements of the left side of her body.  She has no changes in her vision or her speech, she does not have any pain outside of her chronic arthritis and she does not have any fevers chills nausea vomiting or diarrhea she is not coughing or shortness of breath. ? ?She has never had anything like this in the past, she does not take any depression or anxiety medications no antipsychotics, she has not been taking any over-the-counter medications other than some aspirin about a week ago. ? ?She has not taken any medicine for the specific symptoms that she is having today but comes to the ER for further evaluation.  She has never had anything like this in the past. ? ?Home Medications ?Prior to Admission medications   ?Medication Sig Start Date End Date Taking? Authorizing Provider  ?atorvastatin (LIPITOR) 40 MG tablet Take 1 tablet (40 mg total) by mouth daily. 07/10/21 10/15/21  [name], MD  ?chlorthalidone (HYGROTON) 25 MG tablet Take 1 tablet (25 mg total) by mouth daily. 10/15/21   [name], MD  ?losartan (COZAAR) 50 MG tablet Take 1 tablet (50 mg total) by mouth daily. 07/09/21 10/15/21  [name], MD   ?varenicline (CHANTIX CONTINUING MONTH PAK) 1 MG tablet Take 1 tablet (1 mg total) by mouth 2 (two) times daily. ?Patient not taking: Reported on 09/04/2021 09/04/21   Omar Person, MD  ?varenicline (CHANTIX PAK) 0.5 MG X 11 & 1 MG X 42 tablet Take one 0.5 mg tablet by mouth once daily for 3 days, then increase to one 0.5 mg tablet twice daily for 4 days, then increase to one 1 mg tablet twice daily. ?Patient not taking: Reported on 09/04/2021 08/07/21   Omar Person, MD  ?   ? ?Allergies    ?Patient has no known allergies.   ? ?Review of Systems   ?Review of Systems  ?All other systems reviewed and are negative. ? ?Physical Exam ?Updated Vital Signs ?BP (!) 129/96 (BP Location: Left Arm)   Pulse 93   Temp (!) 97.3 ?F (36.3 ?C) (Oral)   Resp 16   SpO2 95%  ?Physical Exam ?Vitals and nursing note reviewed.  ?Constitutional:   ?   General: She is not in acute distress. ?   Appearance: She is well-developed.  ?HENT:  ?   Head: Normocephalic and atraumatic.  ?   Mouth/Throat:  ?   Pharynx: No oropharyngeal exudate.  ?Eyes:  ?   General: No scleral icterus.    ?   Right eye: No discharge.     ?   Left eye: No discharge.  ?   Conjunctiva/sclera:  Conjunctivae normal.  ?   Pupils: Pupils are equal, round, and reactive to light.  ?Neck:  ?   Thyroid: No thyromegaly.  ?   Vascular: No JVD.  ?Cardiovascular:  ?   Rate and Rhythm: Normal rate and regular rhythm.  ?   Heart sounds: Normal heart sounds. No murmur heard. ?  No friction rub. No gallop.  ?Pulmonary:  ?   Effort: Pulmonary effort is normal. No respiratory distress.  ?   Breath sounds: Normal breath sounds. No wheezing or rales.  ?Abdominal:  ?   General: Bowel sounds are normal. There is no distension.  ?   Palpations: Abdomen is soft. There is no mass.  ?   Tenderness: There is no abdominal tenderness.  ?Musculoskeletal:     ?   General: No tenderness. Normal range of motion.  ?   Cervical back: Normal range of motion and neck supple.  ?   Right lower leg:  No edema.  ?   Left lower leg: No edema.  ?Lymphadenopathy:  ?   Cervical: No cervical adenopathy.  ?Skin: ?   General: Skin is warm and dry.  ?   Findings: No erythema or rash.  ?Neurological:  ?   Mental Status: She is alert.  ?   Coordination: Coordination normal.  ?   Comments: This patient is able to hold up all 4 extremities and in fact when she forces the use of her arm or her leg the abnormal choreic movements are minimized though they do not completely go away.  When she is resting she has abnormal involuntary movements and fidgeting of the left arm the left leg.  There is no facial abnormalities, cranial nerves III through XII appear to be intact  ?Psychiatric:     ?   Behavior: Behavior normal.  ? ? ?ED Results / Procedures / Treatments   ?Labs ?(all labs ordered are listed, but only abnormal results are displayed) ?Labs Reviewed  ?COMPREHENSIVE METABOLIC PANEL - Abnormal; Notable for the following components:  ?    Result Value  ? Glucose, Bld 102 (*)   ? All other components within normal limits  ?CBC WITH DIFFERENTIAL/PLATELET  ? ? ?EKG ?None ? ?Radiology ?CT Head Wo Contrast ? ?Result Date: 11/16/2021 ?CLINICAL DATA:  Neuro deficit, acute, stroke suspected EXAM: CT HEAD WITHOUT CONTRAST TECHNIQUE: Contiguous axial images were obtained from the base of the skull through the vertex without intravenous contrast. RADIATION DOSE REDUCTION: This exam was performed according to the departmental dose-optimization program which includes automated exposure control, adjustment of the mA and/or kV according to patient size and/or use of iterative reconstruction technique. COMPARISON:  None. FINDINGS: Brain: Brain volume is normal for age. No acute intracranial hemorrhage or subdural collection. There is advanced periventricular and deep white matter hypodensity with small area of focal encephalomalacia in the posterior left frontal/parietal lobe. No hydrocephalus, midline shift, or mass effect. Vascular:  Atherosclerosis of skullbase vasculature without hyperdense vessel or abnormal calcification. Skull: No fracture or focal lesion. Sinuses/Orbits: Paranasal sinuses and mastoid air cells are clear. The visualized orbits are unremarkable. Bilateral cataract resection. Other: None. IMPRESSION: 1. No acute intracranial abnormality. 2. Periventricular white matter hypodensity is nonspecific, typically chronic small vessel ischemia, but age advanced. 3. Small focal encephalomalacia in the left posterior frontal/parietal lobe suggests prior infarct. Electronically Signed   By: Narda RutherfordMelanie  Sanford M.D.   On: 11/16/2021 15:28   ? ?Procedures ?Procedures  ? ? ?Medications Ordered in ED ?Medications  ?benztropine  mesylate (COGENTIN) injection 1 mg (has no administration in time range)  ? ? ?ED Course/ Medical Decision Making/ A&P ?  ?                        ?Medical Decision Making ?Amount and/or Complexity of Data Reviewed ?Labs: ordered. ? ?Risk ?Prescription drug management. ?Decision regarding hospitalization. ? ? ?This patient presents to the ED for concern of choreic movements, this involves an extensive number of treatment options, and is a complaint that carries with it a high risk of complications and morbidity.  The differential diagnosis includes medication reaction,  focal seizures, stroke, encephalopathy though this seems less likely given normal mental status ? ? ?Co morbidities that complicate the patient evaluation ? ?Hypertension and hypercholesterolemia ? ? ?Additional history obtained: ? ?Additional history obtained from electronic medical record including her medication list ?External records from outside source obtained and reviewed including the cardiology notes from February 14, at that time approximately 1 month ago chlorthalidone was added to her regimen.  The patient has not yet picked up her Chantix ? ? ?Lab Tests: ? ?I Ordered, and personally interpreted labs.  The pertinent results include: CBC and  metabolic panel both of which are unremarkable ? ? ?Imaging Studies ordered: ? ?I ordered imaging studies including CT scan of the brain ?I independently visualized and interpreted imaging which showed no acute stroke, no

## 2021-11-16 NOTE — H&P (Signed)
?History and Physical  ? ? ?Veronica Weber AF[employer]:[id number] DOB: 11-14-1955 DOA: 11/16/2021 ? ?PCP: Allwardt, Crist Infante, PA-C  ?Patient coming from: Home ? ?I have personally briefly reviewed patient's old medical records in [facility] ? ?Chief Complaint: Uncontrollable left upper and lower extremity movement ? ?HPI: ?Veronica Weber is a 66 y.o. female with medical history significant for hypertension, hyperlipidemia, history of vasovagal syncope, and tobacco use who presented to the ED for evaluation of uncontrollable left-sided extremity movement. ? ?Patient reports chronic arthritis in her left knee.  She takes ibuprofen and occasional tramadol as needed for pain.  She took a tramadol night of 3/16.  Morning of 3/17 she noticed new onset of involuntary/uncontrollable movement of her left upper and lower extremities.  She is left-hand dominant and had difficulty using her computer due to the movement disorder.  She normally ambulates with a walker but has had difficulty doing so since onset of symptoms.  She has not had any significant change in sensation or numbness/tingling.  She does report mild headache and nausea without emesis intermittently since symptoms began.  She denies any chest pain, dyspnea, palpitations, abdominal pain, dysuria, diarrhea.  She denies any similar episodes in the past. ? ?She is a chronic smoker of 1 pack/day for at least 45 years.  She denies any alcohol or illicit drug use. ? ?ED Course  Labs/Imaging on admission: I have personally reviewed following labs and imaging studies. ? ?Initial vitals showed BP 129/96, pulse 73, RR 16, temp 97.3 ?F, SPO2 95% on room air. ? ?Labs show sodium 138, potassium 4.0, bicarb 28, BUN 23, creatinine 0.87, serum glucose 102, LFTs within normal limits, WBC 7.6, hemoglobin 13.7, platelets 324,000. ? ?CT head without contrast negative for acute intracranial abnormality.  Periventricular white matter hypodensity is nonspecific,  typically chronic small vessel ischemia, but age advanced.  Small focal encephalomalacia seen in the left posterior frontal/parietal lobe suggest prior infarct. ? ?Patient was given IM Cogentin 1 mg.  EDP discussed with on-call neurologist who recommended admission for further work-up including stroke work-up.  The hospitalist service was consulted to admit for further evaluation and management. ? ?Review of Systems: All systems reviewed and are negative except as documented in history of present illness above. ? ? ?Past Medical History:  ?Diagnosis Date  ? Arthritis   ? Cataract   ? Hyperlipidemia   ? Hypertension   ? Thyroid disease   ? Tobacco use   ? ? ?Past Surgical History:  ?Procedure Laterality Date  ? CATARACT EXTRACTION    ? 1997, 1998  ? CESAREAN SECTION  1983  ? CHOLECYSTECTOMY  1998  ? TUBAL LIGATION  1994  ? ? ?Social History: ? reports that she has been smoking cigarettes. She started smoking about 45 years ago. She has a 22.50 pack-year smoking history. She has never used smokeless tobacco. She reports that she does not currently use alcohol. She reports that she does not use drugs. ? ?No Known Allergies ? ?Family History  ?Problem Relation Age of Onset  ? Arthritis Mother   ? Cancer Maternal Grandmother   ? Diabetes Maternal Grandmother   ? Diabetes Maternal Grandfather   ? Healthy Daughter   ? Healthy Son   ? ? ? ?Prior to Admission medications   ?Medication Sig Start Date End Date Taking? Authorizing Provider  ?atorvastatin (LIPITOR) 40 MG tablet Take 1 tablet (40 mg total) by mouth daily. 07/10/21 10/15/21  [name], MD  ?chlorthalidone (HYGROTON)  25 MG tablet Take 1 tablet (25 mg total) by mouth daily. 10/15/21   Maisie FusBranch, Mary E, MD  ?losartan (COZAAR) 50 MG tablet Take 1 tablet (50 mg total) by mouth daily. 07/09/21 10/15/21  Maisie FusBranch, Mary E, MD  ?varenicline (CHANTIX CONTINUING MONTH PAK) 1 MG tablet Take 1 tablet (1 mg total) by mouth 2 (two) times daily. ?Patient not taking: Reported on  09/04/2021 09/04/21   Omar PersonMeier, Nathaniel M, MD  ?varenicline (CHANTIX PAK) 0.5 MG X 11 & 1 MG X 42 tablet Take one 0.5 mg tablet by mouth once daily for 3 days, then increase to one 0.5 mg tablet twice daily for 4 days, then increase to one 1 mg tablet twice daily. ?Patient not taking: Reported on 09/04/2021 08/07/21   Omar PersonMeier, Nathaniel M, MD  ? ? ?Physical Exam: ?Vitals:  ? 11/16/21 1429 11/16/21 2015  ?BP: (!) 129/96   ?Pulse: 93   ?Resp: 16   ?Temp: (!) 97.3 ?F (36.3 ?C) 98.4 ?F (36.9 ?C)  ?TempSrc: Oral Oral  ?SpO2: 95%   ? ?Constitutional: Resting supine in bed, NAD, calm, comfortable ?Eyes: PERRL, EOMI, lids and conjunctivae normal ?ENMT: Mucous membranes are moist. Posterior pharynx clear of any exudate or lesions.Normal dentition.  ?Neck: normal, supple, no masses. ?Respiratory: Faint end expiratory wheezing. Normal respiratory effort. No accessory muscle use.  ?Cardiovascular: Regular rate and rhythm, no murmurs / rubs / gallops. No extremity edema. 2+ pedal pulses. ?Abdomen: no tenderness, no masses palpated.  ?Musculoskeletal: no clubbing / cyanosis. No joint deformity upper and lower extremities. No contractures. Normal muscle tone.  ?Skin: no rashes, lesions, ulcers. No induration ?Neurologic: Choreiform movements of the left upper and lower extremities more noticeable at rest.  [place] appears to minimize with intentional movement.  CN 2-12 grossly intact. Sensation intact. Strength 5/5 in all 4.  ?Psychiatric: Normal judgment and insight. Alert and oriented x 3. Normal mood.  ? ?EKG: Personally reviewed. Normal sinus rhythm without acute ischemic changes.  Not significantly changed when compared to prior. ? ?Assessment/Plan ?Principal Problem: ?  Left-sided hemichorea ?Active Problems: ?  Hypertension ?  Hyperlipidemia ?  Tobacco use ?  ?Veronica Weber is a 66 y.o. female with medical history significant for hypertension, hyperlipidemia, history of vasovagal syncope, and tobacco use who is admitted for  evaluation of left-sided hemichorea.  Neurology recommending stroke work-up. ? ?Assessment and Plan: ?* Left-sided hemichorea ?Patient presenting with left-sided hemichorea beginning 3/17 AM. CT head negative for acute intracranial abnormality.  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  Concern is for acute CVA versus other movement disorder.  Does not appear to be on any medications high risk for drug-induced chorea. ?-Neurology consulted, recommended admission for stroke work-up ?-Obtain MRI brain ?-Carotid Dopplers ?-Echocardiogram ?-Monitor on telemetry, continue neurochecks ?-Start on aspirin 81 mg daily ?-Continue atorvastatin 40 mg daily ?-Check A1c, lipid panel ?-Allow permissive hypertension for now ? ?Hypertension ?Holding home chlorthalidone and losartan to allow for permissive hypertension for now. ? ?Hyperlipidemia ?Continue atorvastatin. ? ?Tobacco use ?Patient is smoking 1 pack/day for at least 45 years.  Smoking cessation advised.  Nicotine patch provided.  She is planning on starting Chantix as prescribed by her PCP but has not yet picked it up ? ?DVT prophylaxis: enoxaparin (LOVENOX) injection 40 mg Start: 11/16/21 2000 ?Code Status: Full code, confirmed with patient on admission. ?Family Communication: Discussed with patient's mother at bedside. ?Disposition Plan: From home, dispo pending  clinical progress. ?Consults called: Neurology ?Severity of Illness: ?The appropriate patient status for this patient is OBSERVATION. Observation status is judged to be reasonable and necessary in order to provide the required intensity of service to ensure the patient's safety. The patient's presenting symptoms, physical exam findings, and initial radiographic and laboratory data in the context of their medical condition is felt to place them at decreased risk for further clinical deterioration.  Furthermore, it is anticipated that the patient will be medically stable for discharge from the hospital within 2 midnights of admission.   ?Darreld Mclean MD ?[facility] ? ?If 7PM-7AM, please contact night

## 2021-11-17 ENCOUNTER — Observation Stay (HOSPITAL_COMMUNITY): Payer: Medicare Other

## 2021-11-17 ENCOUNTER — Encounter (HOSPITAL_COMMUNITY): Payer: Medicare Other

## 2021-11-17 ENCOUNTER — Other Ambulatory Visit: Payer: Self-pay

## 2021-11-17 DIAGNOSIS — I6340 Cerebral infarction due to embolism of unspecified cerebral artery: Secondary | ICD-10-CM | POA: Insufficient documentation

## 2021-11-17 DIAGNOSIS — I10 Essential (primary) hypertension: Secondary | ICD-10-CM

## 2021-11-17 DIAGNOSIS — I6389 Other cerebral infarction: Secondary | ICD-10-CM | POA: Diagnosis not present

## 2021-11-17 DIAGNOSIS — Z720 Tobacco use: Secondary | ICD-10-CM

## 2021-11-17 DIAGNOSIS — G255 Other chorea: Secondary | ICD-10-CM | POA: Diagnosis not present

## 2021-11-17 DIAGNOSIS — I639 Cerebral infarction, unspecified: Secondary | ICD-10-CM

## 2021-11-17 DIAGNOSIS — E7800 Pure hypercholesterolemia, unspecified: Secondary | ICD-10-CM

## 2021-11-17 LAB — LIPID PANEL
Cholesterol: 201 mg/dL — ABNORMAL HIGH (ref 0–200)
HDL: 55 mg/dL (ref >40)
LDL Cholesterol: 120 mg/dL — ABNORMAL HIGH (ref 0–99)
Total CHOL/HDL Ratio: 3.7 RATIO
Triglycerides: 128 mg/dL (ref <150)
VLDL: 26 mg/dL (ref 0–40)

## 2021-11-17 LAB — HEMOGLOBIN A1C
Hgb A1c MFr Bld: 5.8 % — ABNORMAL HIGH (ref 4.8–5.6)
Mean Plasma Glucose: 119.76 mg/dL

## 2021-11-17 LAB — BASIC METABOLIC PANEL
Anion gap: 8 (ref 5–15)
BUN: 20 mg/dL (ref 8–23)
CO2: 29 mmol/L (ref 22–32)
Calcium: 9.8 mg/dL (ref 8.9–10.3)
Chloride: 101 mmol/L (ref 98–111)
Creatinine, Ser: 0.82 mg/dL (ref 0.44–1.00)
GFR, Estimated: >60 mL/min (ref >60)
Glucose, Bld: 100 mg/dL — ABNORMAL HIGH (ref 70–99)
Potassium: 4.1 mmol/L (ref 3.5–5.1)
Sodium: 138 mmol/L (ref 135–145)

## 2021-11-17 LAB — CBC
HCT: 40 % (ref 36.0–46.0)
Hemoglobin: 13.8 g/dL (ref 12.0–15.0)
MCH: 31.6 pg (ref 26.0–34.0)
MCHC: 34.5 g/dL (ref 30.0–36.0)
MCV: 91.5 fL (ref 80.0–100.0)
Platelets: 291 10*3/uL (ref 150–400)
RBC: 4.37 MIL/uL (ref 3.87–5.11)
RDW: 12.1 % (ref 11.5–15.5)
WBC: 7.8 10*3/uL (ref 4.0–10.5)
nRBC: 0 % (ref 0.0–0.2)

## 2021-11-17 LAB — ECHOCARDIOGRAM COMPLETE
AR max vel: 2.78 cm2
AV Area VTI: 2.72 cm2
AV Area mean vel: 2.64 cm2
AV Mean grad: 3 mmHg
AV Peak grad: 5.2 mmHg
Ao pk vel: 1.14 m/s
Area-P 1/2: 3.42 cm2
S' Lateral: 2.7 cm

## 2021-11-17 LAB — MAGNESIUM: Magnesium: 2 mg/dL (ref 1.7–2.4)

## 2021-11-17 LAB — HIV ANTIBODY (ROUTINE TESTING W REFLEX): HIV Screen 4th Generation wRfx: NONREACTIVE

## 2021-11-17 LAB — VITAMIN B12: Vitamin B-12: 307 pg/mL (ref 180–914)

## 2021-11-17 MED ORDER — LACTATED RINGERS IV SOLN: INTRAVENOUS | Status: DC

## 2021-11-17 MED ORDER — IOHEXOL 350 MG/ML SOLN
75.0000 mL | Freq: Once | INTRAVENOUS | Status: AC | PRN
  Administered 2021-11-17: 75 mL via INTRAVENOUS

## 2021-11-17 MED ORDER — ATORVASTATIN CALCIUM 80 MG PO TABS
80.0000 mg | ORAL_TABLET | Freq: Every day | ORAL | Status: DC
  Administered 2021-11-17 – 2021-11-19 (×3): 80 mg via ORAL
  Filled 2021-11-17 (×3): qty 1

## 2021-11-17 MED ORDER — CLOPIDOGREL BISULFATE 75 MG PO TABS
75.0000 mg | ORAL_TABLET | Freq: Every day | ORAL | Status: DC
  Administered 2021-11-17 – 2021-11-19 (×3): 75 mg via ORAL
  Filled 2021-11-17 (×3): qty 1

## 2021-11-17 MED ORDER — ARIPIPRAZOLE 5 MG PO TABS
5.0000 mg | ORAL_TABLET | Freq: Every day | ORAL | Status: DC
  Administered 2021-11-17 – 2021-11-19 (×3): 5 mg via ORAL
  Filled 2021-11-17 (×3): qty 1

## 2021-11-17 NOTE — TOC Initial Note (Signed)
Transition of Care (TOC) - Initial/Assessment Note  ? ? ?Patient Details  ?Name: Veronica Weber ?MRN: [id number] ?Date of Birth: [birth year] ? ?Transition of Care (TOC) CM/SW Contact:    ?Lawerance Sabal, RN ?Phone Number: ?11/17/2021, 4:26 PM ? ?Clinical Narrative:             ?Spoke w UR Nurse to discuss observation status, at this time patient DOES NOT meet inpatient criteria. Reviewed OBS notice with patient and reviewed with her how it may effect disposition especially to CIR. Discussed that she may need to consider SNF as a back up plan.  ?She is from home alone, children do not live close. Her daughter will be here in "few weeks" and stay for 3 weeks.  ?TOC will continue to follow for DC planning.     ? ? ?Expected Discharge Plan: Home w Home Health Services ?Barriers to Discharge: Continued Medical Work up ? ? ?Patient Goals and CMS Choice ?Patient states their goals for this hospitalization and ongoing recovery are:: to be able to take care of herself ?  ?  ? ?Expected Discharge Plan and Services ?Expected Discharge Plan: Home w Home Health Services ?  ?Discharge Planning Services: CM Consult ?  ?Living arrangements for the past 2 months: Single Family Home ?                ?  ?  ?  ?  ?  ?  ?  ?  ?  ?  ? ?Prior Living Arrangements/Services ?Living arrangements for the past 2 months: Single Family Home ?Lives with:: Self ?  ?       ?  ?  ?  ?  ? ?Activities of Daily Living ?Home Assistive Devices/Equipment: Cane (specify quad or straight) ?ADL Screening (condition at time of admission) ?Patient's cognitive ability adequate to safely complete daily activities?: Yes ?Is the patient deaf or have difficulty hearing?: No ?Does the patient have difficulty seeing, even when wearing glasses/contacts?: No ?Does the patient have difficulty concentrating, remembering, or making decisions?: No ?Patient able to express need for assistance with ADLs?: Yes ?Does the patient have difficulty dressing or bathing?:  No ?Independently performs ADLs?: Yes (appropriate for developmental age) ?Does the patient have difficulty walking or climbing stairs?: Yes ?Weakness of Legs: Both ?Weakness of Arms/Hands: Both ? ?Permission Sought/Granted ?  ?  ?   ?   ?   ?   ? ?Emotional Assessment ?  ?  ?  ?  ?  ?  ? ?Admission diagnosis:  Hemichorea [G25.5] ?Patient Active Problem List  ? Diagnosis Date Noted  ? 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 Hypertension 11/16/2021  ? Hyperlipidemia 11/16/2021  ? Tobacco use 11/16/2021  ? Syncope and collapse 06/26/2021  ? ?PCP:  Allwardt, Crist Infante, PA-C ?Pharmacy:   ?[id number] - [place], [place] - [address] ?[address] ?[place] [address] ?Phone: [phone] Fax: [phone] ? ? ? ? ?Social Determinants of Health (SDOH) Interventions ?  ? ?Readmission Risk Interventions ?No flowsheet data found. ? ? ?

## 2021-11-17 NOTE — Progress Notes (Signed)
? ?PROGRESS NOTE ? ?Veronica Weber XBM:[id number] DOB: [birth year] DOA: 11/16/2021 ?PCP: Allwardt, Crist InfanteAlyssa M, PA-C ? ?HPI/Recap of past 24 hours: ?Veronica Weber is a 66 y.o. female with medical history significant for hypertension, hyperlipidemia, history of vasovagal syncope, and tobacco use disorder who presented to the ED for evaluation of uncontrollable left-sided involuntary movement.   ? ?Work-up revealed patchy and scattered small acute infarcts in the right MCA territory, primarily the posterior right MCA deviation with some MCA/PCA watershed involved.  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  No associated hemorrhage or mass effect. ? ?Seen by neurology. ? ?11/17/2021: Patient was seen and examined at her bedside.  She has no new complaints.  No involuntary movements while laying in bed. ? ? ?Assessment/Plan: ?Principal Problem: ?  Left-sided hemichorea ?Active Problems: ?  Hypertension ?  Hyperlipidemia ?  Tobacco use ? 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 CT head negative for acute intracranial abnormality.  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   ?MRI brain revealed acute CVAs as stated above. ?Stroke work-up in place ?MRA brain and bilateral Doppler carotid ultrasound are pending. ?LDL 120, goal less than 70 ?Hemoglobin A1c 5.8, goal less than 7.0 ?Continue aspirin and Plavix x21 days, then Plavix or aspirin alone indefinitely ?Continue high intensity statin, Lipitor 80 mg daily. ?Allow for permissive hypertension, treat SBP greater than 220 or DBP greater than 120. ?Continue frequent neurochecks. ?PT OT/speech assessment. ? ?Hypertension ?Holding home chlorthalidone and losartan to allow for permissive  hypertension for now. ?IV fluid hydration LR at 50 cc/h. ?  ?Hyperlipidemia ?Continue atorvastatin. ?  ?Tobacco use ?Patient is smoking 1 pack/day for at least 45 years.  Smoking cessation advised.  Nicotine patch provided.  She is planning on starting Chantix as prescribed by her PCP but has not yet picked it up ?  ?DVT prophylaxis: enoxaparin (LOVENOX) injection 40 mg Start: 11/16/21 2000 ?Code Status: Full code, confirmed with patient on admission. ?Family Communication: None at this time.   ? ?Disposition Plan: Likely will discharge to home once neurology signs off.   ? ?Consults called: Neurology ? ?Severity of Illness: ? ? ? ? ? ?Status is: Observation.  Patient requires at least 2 midnights for further evaluation and treatment of present condition. ? ? ? ? ?Objective: ?Vitals:  ? 11/17/21 0005 11/17/21 0456 11/17/21 0726 11/17/21 1156  ?BP: 133/87 116/65 105/71 126/64  ?Pulse: 82 67 76 68  ?Resp: 18 (!) 22 (!) 25 (!) 33  ?Temp: 98.2 ?F (36.8 ?C) (!) 97.3 ?F (36.3 ?C) 97.6 ?F (36.4 ?C) 97.9 ?F (36.6 ?C)  ?TempSrc: Oral Oral Oral Oral  ?SpO2: 100% 96% 95% 96%  ? ?No intake or output data in the 24 hours ending 11/17/21 1316 ?Filed Weights  ? ? ?Exam: ? ?General: 66 y.o. year-old female well developed well nourished in no acute distress.  Alert and oriented x3. ?Cardiovascular: Regular rate and rhythm with no rubs or gallops.  No thyromegaly or JVD noted.   ?Respiratory: Clear to auscultation with no wheezes or rales. Good inspiratory effort. ?Abdomen: Soft nontender nondistended with normal bowel sounds x4 quadrants. ?Musculoskeletal: No lower extremity edema. 2/4 pulses in all 4 extremities. ?Skin: No ulcerative lesions noted or rashes, ?Psychiatry: Mood is appropriate for condition and setting ? ? ?Data Reviewed: ?CBC: ?Recent Labs  ?  Lab 11/16/21 ?1439 11/17/21 ?0201  ?WBC 7.6 7.8  ?NEUTROABS 4.5  --   ?HGB 13.7 13.8  ?HCT 41.7 40.0  ?MCV 94.1 91.5  ?PLT 324 291  ? ?Basic Metabolic Panel: ?Recent Labs  ?Lab  11/16/21 ?1439 11/17/21 ?0201  ?NA 138 138  ?K 4.0 4.1  ?CL 101 101  ?CO2 28 29  ?GLUCOSE 102* 100*  ?BUN 23 20  ?CREATININE 0.87 0.82  ?CALCIUM 9.8 9.8  ?MG  --  2.0  ? ?GFR: ?CrCl cannot be calculated (Unknown ideal weight.). ?Liver Function Tests: ?Recent Labs  ?Lab 11/16/21 ?1439  ?AST 19  ?ALT 19  ?ALKPHOS 105  ?BILITOT 0.5  ?PROT 6.7  ?ALBUMIN 3.8  ? ?No results for input(s): LIPASE, AMYLASE in the last 168 hours. ?No results for input(s): AMMONIA in the last 168 hours. ?Coagulation Profile: ?Recent Labs  ?Lab 11/16/21 ?1820  ?INR 0.9  ? ?Cardiac Enzymes: ?No results for input(s): CKTOTAL, CKMB, CKMBINDEX, TROPONINI in the last 168 hours. ?BNP (last 3 results) ?No results for input(s): PROBNP in the last 8760 hours. ?HbA1C: ?Recent Labs  ?  11/17/21 ?0201  ?HGBA1C 5.8*  ? ?CBG: ?No results for input(s): GLUCAP in the last 168 hours. ?Lipid Profile: ?Recent Labs  ?  11/17/21 ?0201  ?CHOL 201*  ?HDL 55  ?LDLCALC 120*  ?TRIG 128  ?CHOLHDL 3.7  ? ?Thyroid Function Tests: ?No results for input(s): TSH, T4TOTAL, FREET4, T3FREE, THYROIDAB in the last 72 hours. ?Anemia Panel: ?Recent Labs  ?  11/17/21 ?0201  ?VITAMINB12 307  ? ?Urine analysis: ?   ?Component Value Date/Time  ? BILIRUBINUR negative 10/15/2020 1151  ? PROTEINUR Negative 10/15/2020 1151  ? UROBILINOGEN 0.2 10/15/2020 1151  ? NITRITE negative 10/15/2020 1151  ? LEUKOCYTESUR Negative 10/15/2020 1151  ? ?Sepsis Labs: ?@LABRCNTIP (procalcitonin:4,lacticidven:4) ? ?) ?Recent Results (from the past 240 hour(s))  ?Resp Panel by RT-PCR (Flu A&B, Covid) Nasopharyngeal Swab     Status: None  ? Collection Time: 11/16/21  2:18 PM  ? Specimen: Nasopharyngeal Swab; Nasopharyngeal(NP) swabs in vial transport medium  ?Result Value Ref Range Status  ? SARS Coronavirus 2 by RT PCR NEGATIVE NEGATIVE Final  ?  Comment: (NOTE) ?SARS-CoV-2 target nucleic acids are NOT DETECTED. ? ?The SARS-CoV-2 RNA is generally detectable in upper respiratory ?specimens during the acute phase  of infection. The lowest ?concentration of SARS-CoV-2 viral copies this assay can detect is ?138 copies/mL. A negative result does not preclude SARS-Cov-2 ?infection and should not be used as the sole basis for treatment or ?other patient management decisions. A negative result may occur with  ?improper specimen collection/handling, submission of specimen other ?than nasopharyngeal swab, presence of viral mutation(s) within the ?areas targeted by this assay, and inadequate number of viral ?copies(<138 copies/mL). A negative result must be combined with ?clinical observations, patient history, and epidemiological ?information. The expected result is Negative. ? ?Fact Sheet for Patients:  ?11/18/21 ? ?Fact Sheet for Healthcare Providers:  ?BloggerCourse.com ? ?This test is no t yet approved or cleared by the [place] FDA and  ?has been authorized for detection and/or diagnosis of SARS-CoV-2 by ?FDA under an Emergency Use Authorization (EUA). This EUA will remain  ?in effect (meaning this test can be used) for the duration of the ?COVID-19 declaration under Section 564(b)(1) of the Act, 21 ?U.S.C.section 360bbb-3(b)(1), unless the authorization is terminated  ?or revoked sooner.  ? ? ?  ? Influenza A by PCR NEGATIVE NEGATIVE Final  ? Influenza B by PCR NEGATIVE NEGATIVE  Final  ?  Comment: (NOTE) ?The Xpert Xpress SARS-CoV-2/FLU/RSV plus assay is intended as an aid ?in the diagnosis of influenza from Nasopharyngeal swab specimens and ?should not be used as a sole basis for treatment. Nasal washings and ?aspirates are unacceptable for Xpert Xpress SARS-CoV-2/FLU/RSV ?testing. ? ?Fact Sheet for Patients: ?BloggerCourse.com ? ?Fact Sheet for Healthcare Providers: ?[employer] ? ?This test is not yet approved or cleared by the [place] FDA and ?has been authorized for detection and/or diagnosis of  SARS-CoV-2 by ?FDA under an Emergency Use Authorization (EUA). This EUA will remain ?in effect (meaning this test can be used) for the duration of the ?COVID-19 declaration under Section 564(b)(1) of the Act, 21

## 2021-11-17 NOTE — Care Management Obs Status (Signed)
MEDICARE OBSERVATION STATUS NOTIFICATION ? ? ?Patient Details  ?Name: Veronica Weber ?MRN: [id number] ?Date of Birth: 10/27/1955 ? ? ?Medicare Observation Status Notification Given:  Yes ? ? ? ?Carles Collet, RN ?11/17/2021, 4:25 PM ?

## 2021-11-17 NOTE — Evaluation (Signed)
Occupational Therapy Evaluation Patient Details Name: Veronica Weber MRN: [id number] DOB: October 23, 1955 Today's Date: 11/17/2021   History of Present Illness 66 y.o. female who presented to the ED 11/16/21 for evaluation of uncontrollable left-sided extremity movement. 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   PMH significant for HTN, HLD, Hypothyroidism, vasovagal syncope, tobacco use   Clinical Impression   Pt admitted for concerns listed above. PTA pt reported that she was independent with all ADL's and IADL's, including caring for her 2 dogs. At this time, pt is requiring increased assist due to deficits in the LUE and LLE caused by chorea movements. Pt presents with incoordination and balance deficits. She overall is requiring min-mod A for all ADL's and functional mobility. OT will continue to follow acutely.       Recommendations for follow up therapy are one component of a multi-disciplinary discharge planning process, led by the attending physician.  Recommendations may be updated based on patient status, additional functional criteria and insurance authorization.   Follow Up Recommendations  Acute inpatient rehab (3hours/day)    Assistance Recommended at Discharge Frequent or constant Supervision/Assistance  Patient can return home with the following A little help with walking and/or transfers;A little help with bathing/dressing/bathroom;Assistance with cooking/housework;Direct supervision/assist for medications management;Direct supervision/assist for financial management    Functional Status Assessment  Patient has had a recent decline in their functional status and demonstrates the ability to make significant improvements in function in a reasonable and predictable amount of time.  Equipment Recommendations  Other (comment) (TBD)     Recommendations for Other Services Rehab consult     Precautions / Restrictions Precautions Precautions: Fall Restrictions Weight Bearing Restrictions: No      Mobility Bed Mobility               General bed mobility comments: Up in recliner on entry    Transfers Overall transfer level: Needs assistance Equipment used: 1 person hand held assist Transfers: Sit to/from Stand Sit to Stand: Min assist           General transfer comment: slight anterior-posterior imbalance on initial standing, difficulty moving LLE for transfers away from chair      Balance Overall balance assessment: Needs assistance Sitting-balance support: No upper extremity supported, Feet supported Sitting balance-Leahy Scale: Good     Standing balance support: No upper extremity supported Standing balance-Leahy Scale: Fair Standing balance comment: once balance achieved from transfer to stand, able to maintain static standing without support                           ADL either performed or assessed with clinical judgement   ADL Overall ADL's : Needs assistance/impaired Eating/Feeding: Minimal assistance;Sitting   Grooming: Minimal assistance;Sitting   Upper Body Bathing: Minimal assistance;Sitting   Lower Body Bathing: Moderate assistance;Sitting/lateral leans;Sit to/from stand   Upper Body Dressing : Minimal assistance;Sitting   Lower Body Dressing: Moderate assistance;Sitting/lateral leans;Sit to/from stand   Toilet Transfer: Minimal assistance;Ambulation   Toileting- Clothing Manipulation and Hygiene: Moderate assistance;Sitting/lateral lean;Sit to/from stand       Functional mobility during ADLs: Minimal assistance;Rolling walker (2 wheels) General ADL Comments: Increased assist needed due to chorea movements in LUE and LLE     Vision Baseline Vision/History: 1 Wears glasses Ability to See in Adequate Light: 0 Adequate Patient Visual Report: No  change from  baseline Vision Assessment?: No apparent visual deficits     Perception     Praxis Praxis Praxis-Other Comments: Chorea movements in LUE    Pertinent Vitals/Pain Pain Assessment Pain Assessment: Faces Faces Pain Scale: Hurts a little bit Pain Location: bil Knees arthritic Pain Descriptors / Indicators: Aching, Constant Pain Intervention(s): Monitored during session     Hand Dominance Left   Extremity/Trunk Assessment Upper Extremity Assessment Upper Extremity Assessment: LUE deficits/detail LUE Deficits / Details: Motor planning difficulties alone with Chorea movements LUE Sensation: decreased light touch LUE Coordination: decreased fine motor;decreased gross motor   Lower Extremity Assessment Lower Extremity Assessment: Defer to PT evaluation   Cervical / Trunk Assessment Cervical / Trunk Assessment: Normal   Communication Communication Communication: Expressive difficulties (word-finding)   Cognition Arousal/Alertness: Awake/alert Behavior During Therapy: Anxious, Restless Overall Cognitive Status: Within Functional Limits for tasks assessed                                       General Comments  VSS on RA    Exercises     Shoulder Instructions      Home Living Family/patient expects to be discharged to:: Private residence Living Arrangements: Alone Available Help at Discharge: Family;Available PRN/intermittently (mother, friend; daughter coming for 3 weeks on April 7th) Type of Home: House Home Access: Stairs to enter [employer] of Steps: 5 Entrance Stairs-Rails: Right;Left;Can reach both Home Layout: One level     Bathroom Shower/Tub: Producer, television/film/video: Standard Bathroom Accessibility: Yes   Home Equipment: Cane - quad          Prior Functioning/Environment Prior Level of Function : Independent/Modified Independent;Driving             Mobility Comments: uses cane sometimes when bil knee  arthritis hurting          OT Problem List: Decreased strength;Decreased coordination;Decreased safety awareness;Impaired sensation;Impaired UE functional use      OT Treatment/Interventions: Therapeutic exercise;Self-care/ADL training;Neuromuscular education;DME and/or AE instruction;Therapeutic activities;Patient/family education;Balance training    OT Goals(Current goals can be found in the care plan section) Acute Rehab OT Goals Patient Stated Goal: To get her LUE function back OT Goal Formulation: With patient Time For Goal Achievement: 12/01/21 Potential to Achieve Goals: Good ADL Goals Pt Will Perform Lower Body Bathing: with modified independence;sitting/lateral leans;sit to/from stand Pt Will Perform Lower Body Dressing: with modified independence;sitting/lateral leans;sit to/from stand Pt Will Transfer to Toilet: with modified independence;ambulating Pt Will Perform Toileting - Clothing Manipulation and hygiene: with modified independence;sitting/lateral leans;sit to/from stand Additional ADL Goal #1: Pt will complete a functional task using her LUE independently for 3 mins.  OT Frequency: Min 2X/week    Co-evaluation              AM-PAC OT "6 Clicks" Daily Activity     Outcome Measure Help from another person eating meals?: A Little Help from another person taking care of personal grooming?: A Little Help from another person toileting, which includes using toliet, bedpan, or urinal?: A Little Help from another person bathing (including washing, rinsing, drying)?: A Lot Help from another person to put on and taking off regular upper body clothing?: A Little Help from another person to put on and taking off regular lower body clothing?: A Lot 6 Click Score: 16   End of Session Equipment Utilized During Treatment: Gait belt;Rolling walker (2  wheels) Nurse Communication: Mobility status  Activity Tolerance: Patient tolerated treatment well Patient left: in  chair;with call bell/phone within reach;with chair alarm set  OT Visit Diagnosis: Unsteadiness on feet (R26.81);Other abnormalities of gait and mobility (R26.89);Muscle weakness (generalized) (M62.81);Other symptoms and signs involving the nervous system (R29.898);Ataxia, unspecified (R27.0)                Time: 8295-6213 OT Time Calculation (min): 20 min Charges:  OT General Charges $OT Visit: 1 Visit OT Evaluation $OT Eval Moderate Complexity: 1 Mod  Annalisa Colonna H., OTR/L Acute Rehabilitation  [facility] [place] [place]ume 11/17/2021, 4:46 PM

## 2021-11-17 NOTE — Evaluation (Signed)
Speech Language Pathology Evaluation ?Patient Details ?Name: Veronica Weber ?MRN: [id number] ?DOB: 1956-06-21 ?Today's Date: 11/17/2021 ?Time: 2409-7353 ?SLP Time Calculation (min) (ACUTE ONLY): 19 min ? ?Problem List:  ?Patient Active Problem List  ? Diagnosis Date Noted  ? 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 Hypertension 11/16/2021  ? Hyperlipidemia 11/16/2021  ? Tobacco use 11/16/2021  ? Syncope and collapse 06/26/2021  ? ?Past Medical History:  ?Past Medical History:  ?Diagnosis Date  ? Arthritis   ? Cataract   ? Hyperlipidemia   ? Hypertension   ? Thyroid disease   ? Tobacco use   ? ?Past Surgical History:  ?Past Surgical History:  ?Procedure Laterality Date  ? CATARACT EXTRACTION    ? 1997, 1998  ? CESAREAN SECTION  1983  ? CHOLECYSTECTOMY  1998  ? TUBAL LIGATION  1994  ? ?HPI:  ?Veronica Weber is a 66 y.o. female who presented to the ED for evaluation of uncontrollable left-sided involuntary movement. Work-up revealed patchy and scattered small acute infarcts in the right MCA territory, primarily the posterior right MCA deviation with some MCA/PCA watershed involved, chronic contralateral posterior left MCA territory infarct and severe bilateral cerebral white matter disease.  No associated hemorrhage or mass effect.  Pt with medical history significant for hypertension, hyperlipidemia, history of vasovagal syncope, and tobacco use disorder.  ? ?Assessment / Plan / Recommendation ?Clinical Impression ? Pt present with functional cognitive linguistic abilities as assessed using the COGNISTAT (see below for addtional information).  All subtests administered were in the average range.  Pt reports some difficulty with word finding in current setting and says she sounds like she's "tongue-tied;" however, no language deficits were appreciated during assessment or in informal conversation, and pt was 100% intelligible.  If these deficits persist  and are bothersome to pt, consider further evaluation for higher level functions at next venue of care.   ? ?Pt has no ST needs at this time.  SLP will sign off. ? ?COGNISTAT: All subtests are within the average range, except where otherwise specified. ? ?Orientation:  12/12 ?Attention: 8/8 ?Registration: 2 ?Comprehension: 6/6 ?Repetition: 12/12 ?Naming: 8/8 ?Construction: not assessed ?Memory: 11/12 ?Calculations: 4/4 ?Similarities: 7/8 ?Judgment: 6/6 ? ? ?   ?SLP Assessment ? SLP Recommendation/Assessment: Patient does not need any further Speech Lanaguage Pathology Services ?SLP Visit Diagnosis: Cognitive communication deficit (R41.841)  ?  ?Recommendations for follow up therapy are one component of a multi-disciplinary discharge planning process, led by the attending physician.  Recommendations may be updated based on patient status, additional functional criteria and insurance authorization. ?   ?Follow Up Recommendations ? No SLP follow up  ?  ?Assistance Recommended at Discharge ? None  ?Functional Status Assessment Patient has not had a recent decline in their functional status  ?Frequency and Duration  (N/A)  ?  ?  ?   ?SLP Evaluation ?Cognition ? Overall Cognitive Status: Within Functional Limits for tasks assessed ?Orientation Level: Oriented X4 ?Year: 2023 ?Month: March ?Day of Week: Correct ?Attention: Focused;Sustained ?Focused Attention: Appears intact ?Sustained Attention: Appears intact ?Memory: Appears intact ?Problem Solving: Appears intact ?Executive Function: Reasoning ?Reasoning: Appears intact  ?  ?   ?Comprehension ? Auditory Comprehension ?Overall Auditory Comprehension: Appears within functional limits for tasks assessed ?Commands: Within Functional Limits ?Conversation: Complex ?Visual Recognition/Discrimination ?Discrimination: Not tested ?Reading Comprehension ?Reading Status: Not tested  ?  ?Expression Expression ?Primary Mode of Expression: Verbal ?Verbal Expression ?Overall Verbal  Expression: Appears within  functional limits for tasks assessed ?Level of Generative/Spontaneous Verbalization: Conversation ?Repetition: No impairment ?Naming: No impairment ?Pragmatics: No impairment ?Written Expression ?Dominant Hand: Left ?Written Expression: Not tested   ?Oral / Motor ? Motor Speech ?Overall Motor Speech: Appears within functional limits for tasks assessed ?Respiration: Within functional limits ?Phonation: Normal ?Resonance: Within functional limits ?Articulation: Within functional limitis ?Intelligibility: Intelligible ?Motor Planning: Witnin functional limits ?Motor Speech Errors: Not applicable   ?        ? ?Veronica Weber E Veronica Weber , MA, CCC-SLP ?Acute Rehabilitation Services ?Office: [phone]; Pager (3/19): [phone] ?11/17/2021, 3:16 PM ? ?

## 2021-11-17 NOTE — Progress Notes (Signed)
Inpatient Rehab Admissions Coordinator:  ? ?Per therapy recommendation  patient was screened for CIR candidacy by Megan Salon, MS, CCC-SLP. At this time, Pt. Appears to be a a potential candidate for CIR. I will place   order for rehab consult per protocol for full assessment. Please contact me any with questions. ? ?Megan Salon, MS, CCC-SLP ?Rehab Admissions Coordinator  ?[phone] (celll) ?[phone] (office) ? ?

## 2021-11-17 NOTE — Progress Notes (Signed)
?  Echocardiogram ?2D Echocardiogram has been performed. ? ?Veronica Weber ?11/17/2021, 4:10 PM ?

## 2021-11-17 NOTE — Evaluation (Signed)
Physical Therapy Evaluation ?Patient Details ?Name: Veronica Weber ?MRN: [id number] ?DOB: 1956/01/28 ?Today's Date: 11/17/2021 ? ?History of Present Illness ? 66 y.o. female who presented to the ED 11/16/21 for evaluation of uncontrollable left-sided extremity movement. 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   PMH significant for HTN, HLD, Hypothyroidism, vasovagal syncope, tobacco use  ?Clinical Impression ?  ?Pt admitted secondary to problem above with deficits below. PTA patient was living alone and independent with all mobility and ADLs. She occasionally used a quad cane due to bil knee arthritis. She works as a Conservation officer, nature. Pt currently requires min assist for mobility (including gait) due to decr balance and coordination. She needs to be modified independent with mobility to return home with only intermittent supervision/assistance by family and friends. ?will trial RW to see if this helps contain LUE during gait (however may need a walker hand splint).  Anticipate patient will benefit from PT to address problems listed below.Will continue to follow acutely to maximize functional mobility independence and safety.   ?   ?   ? ?Recommendations for follow up therapy are one component of a multi-disciplinary discharge planning process, led by the attending physician.  Recommendations may be updated based on patient status, additional functional criteria and insurance authorization. ? ?Follow Up Recommendations Acute inpatient rehab (3hours/day) (pt has +CVA by MRI, anticipate will be made inpatient status) ? ?  ?Assistance Recommended at Discharge PRN  ?Patient can return home with the following ? Assistance with cooking/housework;Assist for transportation (pt needs to be modified independent with mbility and ADLs as she lives alone with intermittent assist/supervision by  family/friends) ? ?  ?Equipment Recommendations Other (comment) (TBA; may do well with RW to help control LUE in gait)  ?Recommendations for Other Services ? OT consult;Speech consult;Rehab consult  ?  ?Functional Status Assessment Patient has had a recent decline in their functional status and demonstrates the ability to make significant improvements in function in a reasonable and predictable amount of time.  ? ?  ?Precautions / Restrictions Precautions ?Precautions: Fall ?Restrictions ?Weight Bearing Restrictions: No  ? ?  ? ?Mobility ? Bed Mobility ?Overal bed mobility: Modified Independent ?  ?  ?  ?  ?  ?  ?General bed mobility comments: supine to sit with HOB flat and no rail ?  ? ?Transfers ?Overall transfer level: Needs assistance ?Equipment used: 1 person hand held assist ?Transfers: Sit to/from Stand ?Sit to Stand: Min assist ?  ?  ?  ?  ?  ?General transfer comment: slight anterior-posterior imbalance on initial standing ?  ? ?Ambulation/Gait ?Ambulation/Gait assistance: Min assist ?Gait Distance (Feet): 40 Feet ?Assistive device: 1 person hand held assist ?Gait Pattern/deviations: Step-through pattern, Decreased stride length ?Gait velocity: decr ?Gait velocity interpretation: 1.31 - 2.62 ft/sec, indicative of limited community ambulator ?  ?General Gait Details: no difficulty placing LLE; HHA on Left to help control chorea of UE with it reduced but still present ? ?Stairs ?  ?  ?  ?  ?  ? ?Wheelchair Mobility ?  ? ?Modified Rankin (Stroke Patients Only) ?Modified Rankin (Stroke Patients Only) ?Pre-Morbid Rankin Score: No symptoms ?Modified Rankin: Moderately severe disability ? ?  ? ?Balance Overall balance assessment: Needs assistance ?Sitting-balance support: No upper extremity supported, Feet supported ?Sitting balance-Leahy Scale: Good ?  ?  ?Standing balance support: No upper extremity supported ?Standing balance-Leahy Scale: Fair ?  Standing balance comment: once balance achieved from transfer to  stand, able to maintain static standing without support ?  ?  ?  ?  ?  ?  ?  ?  ?  ?  ?  ?   ? ? ? ?Pertinent Vitals/Pain Pain Assessment ?Pain Assessment: Faces ?Faces Pain Scale: Hurts a little bit ?Pain Location: bil Knees arthritic ?Pain Descriptors / Indicators: Aching, Constant ?Pain Intervention(s): Limited activity within patient's tolerance, Monitored during session  ? ? ?Home Living Family/patient expects to be discharged to:: Private residence ?Living Arrangements: Alone ?Available Help at Discharge: Family;Available PRN/intermittently (mother, friend; daughter coming for 3 weeks on April 7th) ?Type of Home: House ?Home Access: Stairs to enter ?Entrance Stairs-Rails: Right;Left;Can reach both ?Entrance Stairs-Number of Steps: 5 ?  ?Home Layout: One level ?Home Equipment: Cane - quad ?   ?  ?Prior Function Prior Level of Function : Independent/Modified Independent;Driving ?  ?  ?  ?  ?  ?  ?Mobility Comments: uses cane sometimes when bil knee arthritis hurting ?  ?  ? ? ?Hand Dominance  ? Dominant Hand: Left ? ?  ?Extremity/Trunk Assessment  ? Upper Extremity Assessment ?Upper Extremity Assessment: Defer to OT evaluation ?  ? ?Lower Extremity Assessment ?Lower Extremity Assessment: LLE deficits/detail ?LLE Deficits / Details: Chorea movements noted in non-weightbearing positions; in weight-bearing, no deficits noted ?LLE Sensation: WNL ?LLE Coordination: decreased fine motor;decreased gross motor ?  ? ?Cervical / Trunk Assessment ?Cervical / Trunk Assessment: Other exceptions ?Cervical / Trunk Exceptions: overweight  ?Communication  ? Communication: Expressive difficulties (word-finding)  ?Cognition Arousal/Alertness: Awake/alert ?Behavior During Therapy: Anxious, Restless ?Overall Cognitive Status: Within Functional Limits for tasks assessed ?  ?  ?  ?  ?  ?  ?  ?  ?  ?  ?  ?  ?  ?  ?  ?  ?  ?  ?  ? ?  ?General Comments   ? ?  ?Exercises    ? ?Assessment/Plan  ?  ?PT Assessment Patient needs continued PT  services  ?PT Problem List Decreased balance;Decreased mobility;Decreased coordination;Decreased knowledge of use of DME;Obesity;Other (comment) (chorea of left extremities) ? ?   ?  ?PT Treatment Interventions DME instruction;Gait training;Stair training;Functional mobility training;Therapeutic activities;Balance training;Neuromuscular re-education;Patient/family education   ? ?PT Goals (Current goals can be found in the Care Plan section)  ?Acute Rehab PT Goals ?Patient Stated Goal: get home to her 2 dogs ?PT Goal Formulation: With patient ?Time For Goal Achievement: 12/01/21 ?Potential to Achieve Goals: Good ? ?  ?Frequency Min 4X/week ?  ? ? ?Co-evaluation   ?  ?  ?  ?  ? ? ?  ?AM-PAC PT "6 Clicks" Mobility  ?Outcome Measure Help needed turning from your back to your side while in a flat bed without using bedrails?: None ?Help needed moving from lying on your back to sitting on the side of a flat bed without using bedrails?: None ?Help needed moving to and from a bed to a chair (including a wheelchair)?: A Little ?Help needed standing up from a chair using your arms (e.g., wheelchair or bedside chair)?: A Little ?Help needed to walk in hospital room?: A Little ?Help needed climbing 3-5 steps with a railing? : A Little ?6 Click Score: 20 ? ?  ?End of Session Equipment Utilized During Treatment: Gait belt ?Activity Tolerance: Patient tolerated treatment well ?Patient left: in chair;with call bell/phone within reach;with chair alarm set ?Nurse Communication: Mobility status ?PT Visit Diagnosis:  Other abnormalities of gait and mobility (R26.89);Other symptoms and signs involving the nervous system (R29.898) ?  ? ?Time: [id number] ?PT Time Calculation (min) (ACUTE ONLY): 21 min ? ? ?Charges:   PT Evaluation ?$PT Eval Moderate Complexity: 1 Mod ?  ?  ?   ? ? ? ?Jerolyn CenterLynn S, PT ?Acute Rehabilitation Services  ?Pager [phone] ?Office [phone] ? ? ?Scherrie NovemberLynn P Saveah Bahar ?11/17/2021, 10:34 AM ? ?

## 2021-11-17 NOTE — Progress Notes (Addendum)
STROKE TEAM PROGRESS NOTE  ? ?INTERVAL HISTORY ?No family at bedside. Patient is seen sitting OOB comfortable, awake, alert. ?11/14/2021 patient left work early for nausea without vomiting and headache and went to bed, she noted taking tramadol that night, which she takes intermittently for knee pain. 11/15/2021 patient noted left sided hemichoreatic movements that morning, which remains unchanged since. Denied any new or changes in meds within last 6 months. She reported h/o  HTN, HLD and tobacco abuse currently. Stated that last year she had a syncopal episode with prodrome of heart palpitations, diaphoresis, SOB, HA, dizziness. 30day holter monitor at that time was negative for afib and she denied any syncopal episodes since.  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 Will start low dose Abilify 5 mg daily with plans to titrate up as needed per below for her chorea. If Abilify trial fails will consider Ingrezza.  ?ECHO completed, pending read.  ? ?Vitals:  ? 11/17/21 0005 11/17/21 0456 11/17/21 0726 11/17/21 1156  ?BP: 133/87 116/65 105/71 126/64  ?Pulse: 82 67 76 68  ?Resp: 18 (!) 22 (!) 25 (!) 33  ?Temp: 98.2 ?F (36.8 ?C) (!) 97.3 ?F (36.3 ?C) 97.6 ?F (36.4 ?C) 97.9 ?F (36.6 ?C)  ?TempSrc: Oral Oral Oral Oral  ?SpO2: 100% 96% 95% 96%  ? ?CBC:  ?Recent Labs  ?Lab 11/16/21 ?1439 11/17/21 ?0201  ?WBC 7.6 7.8  ?NEUTROABS 4.5  --   ?HGB 13.7 13.8  ?HCT 41.7 40.0  ?MCV 94.1 91.5  ?PLT 324 291  ? ?Basic Metabolic Panel:  ?Recent Labs  ?Lab 11/16/21 ?1439 11/17/21 ?0201  ?NA 138 138  ?K 4.0 4.1  ?CL 101 101  ?CO2 28 29  ?GLUCOSE 102* 100*  ?BUN 23 20  ?CREATININE 0.87 0.82  ?CALCIUM 9.8 9.8  ?MG  --  2.0  ? ?Lipid Panel:  ?Recent Labs  ?Lab 11/17/21 ?0201  ?CHOL 201*  ?TRIG 128  ?HDL 55  ?CHOLHDL 3.7  ?VLDL 26  ?LDLCALC 120*  ? ?HgbA1c:  ?Recent Labs  ?Lab 11/17/21 ?0201  ?HGBA1C 5.8*  ? ?Urine Drug Screen: No results for input(s): LABOPIA, COCAINSCRNUR, LABBENZ,  AMPHETMU, THCU, LABBARB in the last 168 hours. ?Alcohol Level  ?Recent Labs  ?Lab 11/16/21 ?1820  ?ETH <10  ? ? ?IMAGING past 24 hours ?CT ANGIO HEAD NECK W WO CM ? ?Result Date: 11/17/2021 ?CLINICAL DATA:  Neuro deficit, acute, stroke suspected Stroke/TIA, determine embolic source EXAM: CT ANGIOGRAPHY HEAD AND NECK TECHNIQUE: Multidetector CT imaging of the head and neck was performed using the standard protocol during bolus administration of intravenous contrast. Multiplanar CT image reconstructions and MIPs were obtained to evaluate the vascular anatomy. Carotid stenosis measurements (when applicable) are obtained utilizing NASCET criteria, using the distal internal carotid diameter as the denominator. RADIATION DOSE REDUCTION: This exam was performed according to the departmental dose-optimization program which includes automated exposure control, adjustment of the mA and/or kV according to patient size and/or use of iterative reconstruction technique. CONTRAST:  75mL OMNIPAQUE IOHEXOL 350 MG/ML SOLN COMPARISON:  CT head November 16, 2021. MRI November 17, 2021. FINDINGS: CT HEAD FINDINGS Brain: Right posterior MCA territory infarcts better characterized on recent MRI. No evidence of progressive mass effect or acute hemorrhage. Contralateral remote left MCA territory infarct. Severe patchy white matter hypoattenuation is nonspecific but compatible with chronic microvascular ischemic disease. Mild cerebral atrophy. No hydrocephalus, mass lesion, midline shift, or extra-axial fluid collection. Vascular: See below. Skull: No acute  fracture. Sinuses: Clear sinuses. Orbits: No acute finding. Review of the MIP images confirms the above findings CTA NECK FINDINGS Aortic arch: Calcific atherosclerosis. Great vessel origins are patent without significant stenosis. Right carotid system: Atherosclerosis at the carotid bifurcation without greater than 50% stenosis. Tortuous ICA. Left carotid system: Atherosclerosis at the carotid  bifurcation without greater than 50% stenosis. Tortuous ICA. Vertebral arteries: Mild stenosis of the proximal right vertebral artery. Both vertebral arteries are patent without evidence of significant (greater than 50%) stenosis. Skeleton: Mild-to-moderate multilevel degenerative change in the cervical spine. Other neck: No evidence of acute abnormality. Upper chest: Calcified granuloma in the left upper lobe. Otherwise, visualized lung apices are clear within limitation of expiratory imaging. Review of the MIP images confirms the above findings CTA HEAD FINDINGS Anterior circulation: Mild to moderate right and mild left atherosclerotic narrowing of the paraclinoid intracranial ICAs. Moderate to severe stenosis of the proximal right M1 MCA. No proximal right M2 MCA occlusion. Left MCA and bilateral AC is patent with multifocal mild atherosclerotic irregularity/narrowing. Posterior circulation: Bilateral intradural vertebral arteries, basilar artery and posterior cerebral arteries are patent without proximal hemodynamically significant stenosis. Small right and probably absent left P1 PCAs with prominent posterior communicating arteries bilaterally, anatomic variant. Venous sinuses: As permitted by contrast timing, patent. Anatomic variants: Detailed below. Review of the MIP images confirms the above findings IMPRESSION: 1. No large vessel occlusion. 2. 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 3. Mild-to-moderate right and mild left paraclinoid ICA stenosis. 4. No significant (greater than 50%) stenosis in the neck. Electronically Signed   By: Margaretha Sheffield M.D.   On: 11/17/2021 13:08  ? ?MR BRAIN WO CONTRAST ? ?Result Date: 11/17/2021 ?CLINICAL DATA:  66 year old female with acute neurologic deficit. EXAM: MRI HEAD WITHOUT CONTRAST TECHNIQUE: Multiplanar, multiecho pulse sequences of the brain and surrounding structures were obtained without intravenous contrast. COMPARISON:  Head CT 11/16/2021.  FINDINGS: Brain: Multifocal scattered, small cortical and white matter infarcts in the posterior right MCA territory, with involvement of the postcentral gyrus and other posterior parietal lobe structures. Mild associated right periatrial white matter involvement. Solitary small acute cortical infarct in the anterior right frontal lobe on series 5, image 79. And a several small superior right occipital lobe and a right lateral temporal lobe foci on series 5, image 70. No contralateral left hemisphere or posterior fossa involvement. Mild T2 and FLAIR hyperintensity in the affected areas with no associated hemorrhage or mass effect. No superimposed midline shift, mass effect, evidence of mass lesion, ventriculomegaly, extra-axial collection or acute intracranial hemorrhage. Cervicomedullary junction and pituitary are within normal limits. Extensive bilateral cerebral white matter T2 and FLAIR hyperintensity with chronic cortical encephalomalacia in the left parietal lobe, posterior left MCA territory. Posterior deep white matter capsules are affected. But the deep gray matter nuclei are relatively spared. Mild to moderate T2 heterogeneity in the pons. Negative cerebellum. No chronic cerebral blood products identified. Vascular: Major intracranial vascular flow voids are preserved. Skull and upper cervical spine: Negative for age visible cervical spine. Visualized bone marrow signal is within normal limits. Sinuses/Orbits: Postoperative changes to both globes. Paranasal Visualized paranasal sinuses and mastoids are stable and well aerated. Other: Visible internal auditory structures appear normal. Negative visible scalp and face. IMPRESSION: 1. Patchy and scattered small acute infarcts in the Right MCA territory, primarily the posterior right MCA division with some MCA/PCA watershed involved. No associated hemorrhage or mass effect. 2. 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 Electronically Signed   By: Genevie Ann M.D.   On: 11/17/2021 04:34   ? ?PHYSICAL EXAM ?Constitutional: Appears well-developed and well-nourished. In no apparent distress. ?Psych: Affe

## 2021-11-17 NOTE — Plan of Care (Signed)
?  Problem: Clinical Measurements: ?Goal: Respiratory complications will improve ?Outcome: Progressing ?Goal: Cardiovascular complication will be avoided ?Outcome: Progressing ?  ?Problem: Activity: ?Goal: Risk for activity intolerance will decrease ?Outcome: Progressing ?  ?Problem: Nutrition: ?Goal: Adequate nutrition will be maintained ?Outcome: Progressing ?  ?Problem: Education: ?Goal: Knowledge of disease or condition will improve ?Outcome: Progressing ?Goal: Knowledge of secondary prevention will improve (SELECT ALL) ?Outcome: Progressing ?Goal: Knowledge of patient specific risk factors will improve (INDIVIDUALIZE FOR PATIENT) ?Outcome: Progressing ?Goal: Individualized Educational Video(s) ?Outcome: Progressing ?  ?Problem: Coping: ?Goal: Will verbalize positive feelings about self ?Outcome: Progressing ?Goal: Will identify appropriate support needs ?Outcome: Progressing ?  ?Problem: Health Behavior/Discharge Planning: ?Goal: Ability to manage health-related needs will improve ?Outcome: Progressing ?  ?Problem: Self-Care: ?Goal: Ability to participate in self-care as condition permits will improve ?Outcome: Progressing ?  ?

## 2021-11-17 NOTE — Consult Note (Signed)
NEUROLOGY CONSULTATION NOTE   Date of service: November 17, 2021 Patient Name: Veronica Weber MRN:  [id number] DOB:  05-Jun-1956 Reason for consult: "choreiform movements" Requesting Provider: Charlsie Quest, MD _ _ _   _ __   _ __ _ _  __ __   _ __   __ _  History of Present Illness  Veronica Weber is a 66 y.o. female with PMH significant for HTN, HLD, Hypothyroidism, vasovagal syncope, tobacco use, who went to bed on Thursday 3/16 and woke up on Friday with choreiform movements in her L arm and L leg. These are involuntary.  No exposure to heavy metals, no hx of huntingtons or family hx of huntingtons disease, no sore throat, no history of wilson's disease, does not cook with copper pots and pans, works as a Conservation officer, nature, no recent travel, no exposure to HIV or syphilis, no exposure to carbon monoxide, no hx of autoimmune disease. Never had anything like this in the past.  She smokes everyday, no recreational substances, no alcohol use.   ROS   Constitutional Denies weight loss, fever and chills.   HEENT Denies changes in vision and hearing.   Respiratory Denies SOB and cough.   CV Denies palpitations and CP   GI Denies abdominal pain, nausea, vomiting and diarrhea.   GU Denies dysuria and urinary frequency.   MSK Denies myalgia and joint pain.   Skin Denies rash and pruritus.   Neurological Denies headache and syncope.   Psychiatric Denies recent changes in mood. Denies anxiety and depression.    Past History   Past Medical History:  Diagnosis Date   Arthritis    Cataract    Hyperlipidemia    Hypertension    Thyroid disease    Tobacco use    Past Surgical History:  Procedure Laterality Date   CATARACT EXTRACTION     1997, 1998   CESAREAN SECTION  1983   CHOLECYSTECTOMY  1998   TUBAL LIGATION  1994   Family History  Problem Relation Age of Onset   Arthritis Mother    Cancer Maternal Grandmother    Diabetes Maternal Grandmother    Diabetes  Maternal Grandfather    Healthy Daughter    Healthy Son    Social History   Socioeconomic History   Marital status: Divorced    Spouse name: Not on file   Number of children: 2   Years of education: Not on file   Highest education level: Some college, no degree  Occupational History   Not on file  Tobacco Use   Smoking status: Every Day    Packs/day: 0.50    Years: 45.00    Pack years: 22.50    Types: Cigarettes    Start date: 06/01/1976   Smokeless tobacco: Never  Vaping Use   Vaping Use: Never used  Substance and Sexual Activity   Alcohol use: Not Currently   Drug use: Never   Sexual activity: Not Currently  Other Topics Concern   Not on file  Social History Narrative   Lives alone   Caffeine- coffee 1 -2 c daily   Social Determinants of Health   Financial Resource Strain: Not on file  Food Insecurity: Not on file  Transportation Needs: Not on file  Physical Activity: Not on file  Stress: Not on file  Social Connections: Not on file   No Known Allergies  Medications   Medications Prior to Admission  Medication Sig Dispense Refill Last Dose  aspirin 325 MG tablet Take 325 mg by mouth daily as needed for moderate pain.   Past Week   atorvastatin (LIPITOR) 40 MG tablet Take 1 tablet (40 mg total) by mouth daily. 90 tablet 3 11/15/2021   chlorthalidone (HYGROTON) 25 MG tablet Take 1 tablet (25 mg total) by mouth daily. 90 tablet 3 11/15/2021   ibuprofen (ADVIL) 200 MG tablet Take 200 mg by mouth every 6 (six) hours as needed for moderate pain or mild pain.   Past Week   losartan (COZAAR) 50 MG tablet Take 1 tablet (50 mg total) by mouth daily. 90 tablet 3 11/15/2021   varenicline (CHANTIX CONTINUING MONTH PAK) 1 MG tablet Take 1 tablet (1 mg total) by mouth 2 (two) times daily. (Patient not taking: Reported on 09/04/2021) 60 tablet 1 Not Taking   varenicline (CHANTIX PAK) 0.5 MG X 11 & 1 MG X 42 tablet Take one 0.5 mg tablet by mouth once daily for  3 days, then increase to one 0.5 mg tablet twice daily for 4 days, then increase to one 1 mg tablet twice daily. (Patient not taking: Reported on 09/04/2021) 53 tablet 0 Not Taking     Vitals   Vitals:   11/16/21 1429 11/16/21 2015 11/16/21 2056 11/17/21 0005  BP: (!) 129/96  (!) 114/58 133/87  Pulse: 93  66 82  Resp: 16  18 18   Temp: (!) 97.3 F (36.3 C) 98.4 F (36.9 C) 98.6 F (37 C) 98.2 F (36.8 C)  TempSrc: Oral Oral Oral Oral  SpO2: 95%  97% 100%     There is no height or weight on file to calculate BMI.  Physical Exam   General: Laying comfortably in bed; in no acute distress.  HENT: Normal oropharynx and mucosa. Normal external appearance of ears and nose.  Neck: Supple, no pain or tenderness  CV: No JVD. No peripheral edema.  Pulmonary: Symmetric Chest rise. Normal respiratory effort.  Abdomen: Soft to touch, non-tender.  Ext: No cyanosis, edema, or deformity  Skin: No rash. Normal palpation of skin.   Musculoskeletal: Normal digits and nails by inspection. No clubbing.   Neurologic Examination  Mental status/Cognition: Alert, oriented to self, place, month and year, good attention.  Speech/language: Fluent, comprehension intact, object naming intact, repetition intact.  Cranial nerves:   CN II Pupils equal and reactive to light, no VF deficits    CN III,IV,VI EOM intact, no gaze preference or deviation, no nystagmus    CN V normal sensation in V1, V2, and V3 segments bilaterally    CN VII no asymmetry, no nasolabial fold flattening    CN VIII normal hearing to speech    CN IX & X normal palatal elevation, no uvular deviation    CN XI 5/5 head turn and 5/5 shoulder shrug bilaterally    CN XII midline tongue protrusion    Motor:  Muscle bulk: normal, tone normal, pronator drift none. Has choreiform movements in LUE and LLE. Mvmt Root Nerve  Muscle Right Left Comments  SA C5/6 Ax Deltoid 5 5   EF C5/6 Mc Biceps 5 5   EE C6/7/8 Rad Triceps 5 5   WF C6/7 Med  FCR     WE C7/8 PIN ECU     F Ab C8/T1 U ADM/FDI 5 5   HF L1/2/3 Fem Illopsoas 5 5   KE L2/3/4 Fem Quad 5 5   DF L4/5 D Peron Tib Ant 5 5   PF S1/2 Tibial Grc/Sol 5  5    Reflexes:  Right Left Comments  Pectoralis      Biceps (C5/6) 2 2   Brachioradialis (C5/6) 2 2    Triceps (C6/7) 2 2    Patellar (L3/4) 2 2    Achilles (S1)      Hoffman      Plantar     Jaw jerk    Sensation:  Light touch Intact throughout   Pin prick    Temperature    Vibration   Proprioception    Coordination/Complex Motor:  - Finger to Nose with mild LUE ataxia. - Heel to shin with mild LLE ataxia. - Rapid alternating movement are normal - Gait: deferred.  Labs   CBC:  Recent Labs  Lab 11/16/21 1439 11/17/21 0201  WBC 7.6 7.8  NEUTROABS 4.5  --   HGB 13.7 13.8  HCT 41.7 40.0  MCV 94.1 91.5  PLT 324 291    Basic Metabolic Panel:  Lab Results  Component Value Date   NA 138 11/16/2021   K 4.0 11/16/2021   CO2 28 11/16/2021   GLUCOSE 102 (H) 11/16/2021   BUN 23 11/16/2021   CREATININE 0.87 11/16/2021   CALCIUM 9.8 11/16/2021   GFRNONAA >60 11/16/2021   Lipid Panel:  Lab Results  Component Value Date   LDLCALC 102 (H) 10/15/2021   HgbA1c:  Lab Results  Component Value Date   HGBA1C 5.8 (H) 11/17/2021   Urine Drug Screen: No results found for: LABOPIA, COCAINSCRNUR, LABBENZ, AMPHETMU, THCU, LABBARB  Alcohol Level     Component Value Date/Time   ETH <10 11/16/2021 1820    CT Head without contrast(Personally reviewed): CTH was negative for a large hypodensity concerning for a large territory infarct or hyperdensity concerning for an ICH  MRI Brain: pending   Impression   SUI CRAGGS is a 66 y.o. female with PMH significant for HTN, HLD, Hypothyroidism, vasovagal syncope, tobacco use, who went to bed on Thursday 3/16 and woke up on Friday with choreiform movements in her L arm and L leg. These are involuntary. Her neurologic examination is notable for  choreiform movements in LUE and LLE.  Her sudden onset unilateral choreiform movement is most consistent with a stroke, specially of the Right basal ganglia. Will start with MRI Brain without contrast but if negative for an acute stroke, would recommend serum Cu, Ceruloplasmin(wilsons disease), Carboxyhemoglobin, ASO (sydenham's chorea), heavy metal screen, HIV, RPR, Endomysial Ab (celiac disease), Autoimmune encephalitis panel, ESR, CRP, Antiphopholipid ab panel, DS DNA, Vit B12, folate, Lupus anticoagulant. With that said, it is somewhat unlikely for autoimmune disorders associated with chorea to present so rapidly.  Recommendations  - MRI Brain without contrast. Further workup as above after MRI Brain. ______________________________________________________________________   Thank you for the opportunity to take part in the care of this patient. If you have any further questions, please contact the neurology consultation attending.  Signed,  Erick Blinks [facility] Pager Number [phone] _ _ _   _ __   _ __ _ _  __ __   _ __   __ _

## 2021-11-18 ENCOUNTER — Other Ambulatory Visit (HOSPITAL_COMMUNITY): Payer: Self-pay

## 2021-11-18 DIAGNOSIS — Z79899 Other long term (current) drug therapy: Secondary | ICD-10-CM | POA: Diagnosis not present

## 2021-11-18 DIAGNOSIS — Z602 Problems related to living alone: Secondary | ICD-10-CM | POA: Diagnosis present

## 2021-11-18 DIAGNOSIS — E039 Hypothyroidism, unspecified: Secondary | ICD-10-CM | POA: Diagnosis present

## 2021-11-18 DIAGNOSIS — G255 Other chorea: Secondary | ICD-10-CM | POA: Diagnosis present

## 2021-11-18 DIAGNOSIS — G9389 Other specified disorders of brain: Secondary | ICD-10-CM | POA: Diagnosis present

## 2021-11-18 DIAGNOSIS — Z8673 Personal history of transient ischemic attack (TIA), and cerebral infarction without residual deficits: Secondary | ICD-10-CM | POA: Diagnosis not present

## 2021-11-18 DIAGNOSIS — R55 Syncope and collapse: Secondary | ICD-10-CM | POA: Diagnosis present

## 2021-11-18 DIAGNOSIS — Z20822 Contact with and (suspected) exposure to covid-19: Secondary | ICD-10-CM | POA: Diagnosis present

## 2021-11-18 DIAGNOSIS — I639 Cerebral infarction, unspecified: Secondary | ICD-10-CM | POA: Diagnosis present

## 2021-11-18 DIAGNOSIS — Z7982 Long term (current) use of aspirin: Secondary | ICD-10-CM | POA: Diagnosis not present

## 2021-11-18 DIAGNOSIS — Z833 Family history of diabetes mellitus: Secondary | ICD-10-CM | POA: Diagnosis not present

## 2021-11-18 DIAGNOSIS — F17210 Nicotine dependence, cigarettes, uncomplicated: Secondary | ICD-10-CM | POA: Diagnosis present

## 2021-11-18 DIAGNOSIS — F419 Anxiety disorder, unspecified: Secondary | ICD-10-CM | POA: Diagnosis present

## 2021-11-18 DIAGNOSIS — Z23 Encounter for immunization: Secondary | ICD-10-CM | POA: Diagnosis present

## 2021-11-18 DIAGNOSIS — I63511 Cerebral infarction due to unspecified occlusion or stenosis of right middle cerebral artery: Secondary | ICD-10-CM | POA: Diagnosis not present

## 2021-11-18 DIAGNOSIS — I10 Essential (primary) hypertension: Secondary | ICD-10-CM | POA: Diagnosis present

## 2021-11-18 DIAGNOSIS — I69398 Other sequelae of cerebral infarction: Secondary | ICD-10-CM | POA: Diagnosis not present

## 2021-11-18 DIAGNOSIS — Z8261 Family history of arthritis: Secondary | ICD-10-CM | POA: Diagnosis not present

## 2021-11-18 DIAGNOSIS — E7800 Pure hypercholesterolemia, unspecified: Secondary | ICD-10-CM | POA: Diagnosis present

## 2021-11-18 DIAGNOSIS — M170 Bilateral primary osteoarthritis of knee: Secondary | ICD-10-CM | POA: Diagnosis present

## 2021-11-18 DIAGNOSIS — Z6841 Body Mass Index (BMI) 40.0 and over, adult: Secondary | ICD-10-CM | POA: Diagnosis not present

## 2021-11-18 DIAGNOSIS — I679 Cerebrovascular disease, unspecified: Secondary | ICD-10-CM

## 2021-11-18 DIAGNOSIS — I63513 Cerebral infarction due to unspecified occlusion or stenosis of bilateral middle cerebral arteries: Secondary | ICD-10-CM | POA: Diagnosis present

## 2021-11-18 LAB — URINALYSIS, ROUTINE W REFLEX MICROSCOPIC
Bilirubin Urine: NEGATIVE
Glucose, UA: NEGATIVE mg/dL
Hgb urine dipstick: NEGATIVE
Ketones, ur: NEGATIVE mg/dL
Leukocytes,Ua: NEGATIVE
Nitrite: NEGATIVE
Protein, ur: NEGATIVE mg/dL
Specific Gravity, Urine: 1.016 (ref 1.005–1.030)
pH: 5 (ref 5.0–8.0)

## 2021-11-18 LAB — RAPID URINE DRUG SCREEN, HOSP PERFORMED
Amphetamines: NOT DETECTED
Barbiturates: NOT DETECTED
Benzodiazepines: NOT DETECTED
Cocaine: NOT DETECTED
Opiates: NOT DETECTED
Tetrahydrocannabinol: NOT DETECTED

## 2021-11-18 MED ORDER — NICOTINE 21 MG/24HR TD PT24
21.0000 mg | MEDICATED_PATCH | Freq: Every day | TRANSDERMAL | 0 refills | Status: DC
  Filled 2021-11-18: qty 28, 28d supply, fill #0

## 2021-11-18 MED ORDER — ARIPIPRAZOLE 5 MG PO TABS
5.0000 mg | ORAL_TABLET | Freq: Every day | ORAL | 0 refills | Status: DC
  Filled 2021-11-18: qty 30, 30d supply, fill #0

## 2021-11-18 MED ORDER — ATORVASTATIN CALCIUM 80 MG PO TABS
80.0000 mg | ORAL_TABLET | Freq: Every day | ORAL | 0 refills | Status: DC
  Filled 2021-11-18: qty 90, 90d supply, fill #0

## 2021-11-18 MED ORDER — CLOPIDOGREL BISULFATE 75 MG PO TABS
75.0000 mg | ORAL_TABLET | Freq: Every day | ORAL | 0 refills | Status: DC
  Filled 2021-11-18: qty 21, 21d supply, fill #0

## 2021-11-18 MED ORDER — ASPIRIN EC 325 MG PO TBEC
325.0000 mg | DELAYED_RELEASE_TABLET | Freq: Every day | ORAL | Status: DC
  Administered 2021-11-19: 325 mg via ORAL
  Filled 2021-11-18: qty 1

## 2021-11-18 MED ORDER — ASPIRIN 81 MG PO TBEC
81.0000 mg | DELAYED_RELEASE_TABLET | Freq: Every day | ORAL | 0 refills | Status: DC
  Filled 2021-11-18: qty 30, 30d supply, fill #0

## 2021-11-18 NOTE — PMR Pre-admission (Signed)
PMR Admission Coordinator Pre-Admission Assessment ? ?Patient: Veronica Weber is an 66 y.o., female ?MRN: [id number] ?DOB: Jan 23, 1956 ?Height:   ?Weight:   ? ?Insurance Information ?HMO: yes    PPO:      PCP:      IPA:      80/20:      OTHER:  ?PRIMARY: [employer] Medicare      Policy#: [id number]      Subscriber: pt ?CM Name: Ubaldo Glassing      Phone#: [phone] option #7     Fax#: [phone] ?Pre-Cert#: [id number]   approved for 7 days    Employer:  ?Benefits:  Phone #: [phone]     Name: 3/20 ?Eff. Date: 09/01/21     Deduct: none      Out of Pocket Max: $3600      Life Max: none ?CIR: $295 co pay per day days 1 until 5      SNF: no copay days 1 until 20; $196 co pay per day days 21 until 39; no copay days 40 until 100 ?Outpatient: $20 per visit     Co-Pay: visits per medical neccesity ?Home Health: 100%      Co-Pay: visits per medical neccesity ?DME: 80%     Co-Pay: 20% ?Providers: in network ? ?SECONDARY: none ? ?Financial Counselor:       Phone#:  ? ?The ?Data Collection Information Summary? for patients in Inpatient Rehabilitation Facilities with attached ?Privacy Act [place] Records? was provided and verbally reviewed with: Patient ? ?Emergency Contact Information ?Contact Information   ? ? Name Relation Home Work Mobile  ? [facility] Mother [phone]    ? Amini,Parisa Daughter   [phone]  ? ?  ? ?Current Medical History  ?Patient Admitting Diagnosis: CVA ? ?History of Present Illness:  66 year old right-handed female with history of hypertension hyperlipidemia, osteoarthritis bilateral knees, history of vasovagal syncope and tobacco use.   By report patient went to bed on Thursday 3/16 and woke up on Friday with choreiform involuntary movements in her left arm and leg.  She was admitted 11/16/2021 with CT/MRI of the brain showing patchy and scattered small acute infarcts in the right MCA territory primarily the posterior right MCA division with some MCA/PCA watershed  involvement.  No associated hemorrhage or mass effect.  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  CT angiogram head and neck no large vessel occlusion.  Echocardiogram with ejection fraction of 60 to 65% no wall motion abnormalities grade 1 diastolic dysfunction.  Admission chemistries unremarkable except glucose 102, alcohol negative, urine drug screen negative, urinalysis negative nitrite.  Patient has been placed on aspirin 325 mg daily and Plavix 75 mg day x3 months then aspirin alone.  Subcutaneous Lovenox for DVT prophylaxis.  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  Maintain on a regular consistency diet.   ? ?Complete NIHSS TOTAL: 0 ? ?Patient's medical record from [facility]  has been reviewed by the rehabilitation admission coordinator and physician. ? ?Past Medical History  ?Past Medical History:  ?Diagnosis Date  ? Arthritis   ? Cataract   ? Hyperlipidemia   ? Hypertension   ? Thyroid disease   ? Tobacco use   ? ?Has the patient had major surgery during 100 days prior to admission? No ? ?Family History   ?family history includes Arthritis in her  mother; Cancer in her maternal grandmother; Diabetes in her maternal grandfather and maternal grandmother; Healthy in her daughter and son. ? ?Current Medications ? ?Current Facility-Administered Medications:  ?  acetaminophen (TYLENOL) tablet 650 mg, 650 mg, Oral, Q4H PRN, 650 mg at 11/18/21 1203 **OR** acetaminophen (TYLENOL) 160 MG/5ML solution 650 mg, 650 mg, Per Tube, Q4H PRN **OR** acetaminophen (TYLENOL) suppository 650 mg, 650 mg, Rectal, Q4H PRN, Allena Katz, Vishal R, MD ?  albuterol (PROVENTIL) (2.5 MG/3ML) 0.083% nebulizer solution 3 mL, 3 mL, Inhalation, Q6H PRN, Darreld Mclean R, MD ?  ARIPiprazole (ABILIFY) tablet 5 mg, 5 mg, Oral, Daily, Princess Bruins, DO, 5 mg at 11/19/21 1540 ?  aspirin  EC tablet 325 mg, 325 mg, Oral, Daily, Marvel Plan, MD, 325 mg at 11/19/21 0867 ?  atorvastatin (LIPITOR) tablet 80 mg, 80 mg, Oral, Daily, Marvel Plan, MD, 80 mg at 11/19/21 6195 ?  clopidogrel (PLAVIX) tablet 75 mg, 75 mg, Oral, Daily, Princess Bruins, DO, 75 mg at 11/19/21 0932 ?  enoxaparin (LOVENOX) injection 40 mg, 40 mg, Subcutaneous, Q24H, Darreld Mclean R, MD, 40 mg at 11/18/21 2003 ?  lactated ringers infusion, , Intravenous, Continuous, Darlin Drop, DO, Last Rate: 50 mL/hr at 11/18/21 1343, New Bag at 11/18/21 1343 ?  nicotine (NICODERM CQ - dosed in mg/24 hours) patch 21 mg, 21 mg, Transdermal, Daily, Darreld Mclean R, MD, 21 mg at 11/19/21 6712 ?  senna-docusate (Senokot-S) tablet 1 tablet, 1 tablet, Oral, QHS PRN, Charlsie Quest, MD ? ?Patients Current Diet:  ?Diet Order   ? ?       ?  Diet Heart Room service appropriate? Yes; Fluid consistency: Thin  Diet effective now       ?  ? ?  ?  ? ?  ? ?Precautions / Restrictions ?Precautions ?Precautions: Fall ?Restrictions ?Weight Bearing Restrictions: No  ? ?Has the patient had 2 or more falls or a fall with injury in the past year? No ? ?Prior Activity Level ?Community (5-7x/wk): Independent, driving, works as Conservation officer, nature at [employer] ? ?Prior Functional Level ?Self Care: Did the patient need help bathing, dressing, using the toilet or eating? Independent ? ?Indoor Mobility: Did the patient need assistance with walking from room to room (with or without device)? Independent ? ?Stairs: Did the patient need assistance with internal or external stairs (with or without device)? Independent ? ?Functional Cognition: Did the patient need help planning regular tasks such as shopping or remembering to take medications? Independent ? ?Patient Information ?Are you of Hispanic, Latino/a,or Spanish origin?: A. No, not of Hispanic, Latino/a, or Spanish origin ?What is your race?: A. White ?Do you need or want an interpreter to communicate with a doctor or health care  staff?: 0. No ? ?Patient's Response To:  ?Health Literacy and Transportation ?Is the patient able to respond to health literacy and transportation needs?: Yes ?Health Literacy - How often do you need to have someone help you when you read instructions, pamphlets, or other written material from your doctor or pharmacy?: Never ?In the past 12 months, has lack of transportation kept you from medical appointments or from getting medications?: No ?In the past 12 months, has lack of transportation kept you from meetings, work, or from getting things needed for daily living?: No ? ?Home Assistive Devices / Equipment ?Home Assistive Devices/Equipment: Cane (specify quad or straight) ?Home Equipment: Cane - quad ? ?Prior Device Use: Indicate devices/aids used by the patient prior to current illness, exacerbation or injury?  [employer]  occasionally ? ?Current Functional Level ?Cognition ? Overall Cognitive Status: Within Functional Limits for tasks assessed ?Orientation Level: Oriented X4 ?General Comments: requires frequent cues for safety and correct use of RW ?Attention: Focused, Sustained ?Focused Attention: Appears intact ?Sustained Attention: Appears intact ?Memory: Appears intact ?Problem Solving: Appears intact ?Executive Function: Reasoning ?Reasoning: Appears intact ?   ?Extremity Assessment ?(includes Sensation/Coordination) ? Upper Extremity Assessment: LUE deficits/detail ?LUE Deficits / Details: Motor planning difficulties alone with Chorea movements ?LUE Sensation: decreased light touch ?LUE Coordination: decreased fine motor, decreased gross motor  ?Lower Extremity Assessment: Defer to PT evaluation ?LLE Deficits / Details: Chorea movements noted in non-weightbearing positions; in weight-bearing, no deficits noted ?LLE Sensation: WNL ?LLE Coordination: decreased fine motor, decreased gross motor  ?  ?ADLs ? Overall ADL's : Needs assistance/impaired ?Eating/Feeding: Minimal assistance, Sitting ?Grooming: Minimal  assistance, Sitting, Wash/dry hands, Wash/dry face, Brushing hair ?Grooming Details (indicate cue type and reason): able to wash hands and face standing at sink with min guard assist for safety. Brushed hair seat

## 2021-11-18 NOTE — Progress Notes (Signed)
Physical Therapy Treatment ?Patient Details ?Name: Veronica Weber ?MRN: [id number] ?DOB: [birth year] ?Today's Date: 11/18/2021 ? ? ?History of Present Illness 66 y.o. female who presented to the ED 11/16/21 for evaluation of uncontrollable left-sided extremity movement. 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   PMH significant for HTN, HLD, Hypothyroidism, vasovagal syncope, tobacco use ? ?  ?PT Comments  ? ? Patient eager to work with therapy as she is tired of being in bed and wants to get well enough to go home. Patient able to ambulate 250 ft without a device (did poorly with RW due to lifting/carrying; impulsivity; and sporadic LUE movements). She had good, consistent placement of LLE during gait. She has decr velocity and incr lateral sway related to bil knee arthritis/pain. Main impairment continues to be LUE, although pt is consistently trying to use it despite deficits.  ?   ?Recommendations for follow up therapy are one component of a multi-disciplinary discharge planning process, led by the attending physician.  Recommendations may be updated based on patient status, additional functional criteria and insurance authorization. ? ?Follow Up Recommendations ? Acute inpatient rehab (3hours/day) ?  ?  ?Assistance Recommended at Discharge PRN  ?Patient can return home with the following Assistance with cooking/housework;Assist for transportation (pt needs to be modified independent with mobility and ADLs as she lives alone with intermittent assist/supervision by family/friends) ?  ?Equipment Recommendations ? None recommended by PT  ?  ?Recommendations for Other Services   ? ? ?  ?Precautions / Restrictions Precautions ?Precautions: Fall ?Restrictions ?Weight Bearing Restrictions: No  ?  ? ?Mobility ? Bed Mobility ?Overal bed mobility: Modified Independent ?  ?  ?  ?  ?  ?   ?General bed mobility comments: HOB flat and no rial ?  ? ?Transfers ?Overall transfer level: Needs assistance ?Equipment used: None, Rolling walker (2 wheels) ?Transfers: Sit to/from Stand ?Sit to Stand: Min guard ?  ?  ?  ?  ?  ?General transfer comment: required vc for safe use of RW; second transfer without device and no imbalance noted ?  ? ?Ambulation/Gait ?Ambulation/Gait assistance: Min guard ?Gait Distance (Feet): 250 Feet ?Assistive device: None ?Gait Pattern/deviations: Step-through pattern, Decreased stride length (incr lateral sway) ?Gait velocity: decr ?Gait velocity interpretation: 1.31 - 2.62 ft/sec, indicative of limited community ambulator ?  ?General Gait Details: no difficulty placing LLE; incr lateral sway per her baseline; when attempted RW, pt lifting RW (?due to impulsivity vs sporadic LUE movements), did well without device ? ? ?Stairs ?  ?  ?  ?  ?  ? ? ?Wheelchair Mobility ?  ? ?Modified Rankin (Stroke Patients Only) ?Modified Rankin (Stroke Patients Only) ?Pre-Morbid Rankin Score: No symptoms ?Modified Rankin: Moderately severe disability ? ? ?  ?Balance Overall balance assessment: Needs assistance ?Sitting-balance support: No upper extremity supported, Feet supported ?Sitting balance-Leahy Scale: Good ?  ?  ?Standing balance support: No upper extremity supported ?Standing balance-Leahy Scale: Fair ?Standing balance comment: able to pull down/up her brief as toileting without imbalance ?  ?  ?  ?  ?  ?  ?  ?  ?  ?  ?  ?  ? ?  ?Cognition Arousal/Alertness: Awake/alert ?Behavior During Therapy: Anxious, Restless ?Overall Cognitive Status: Within Functional Limits for tasks assessed ?  ?  ?  ?  ?  ?  ?  ?  ?  ?  ?  ?  ?  ?  ?  ?  ?  General Comments: moving quickly/impulsively is likely her baseline ?  ?  ? ?  ?Exercises   ? ?  ?General Comments General comments (skin integrity, edema, etc.): VSS on RA per monitor ?  ?  ? ?Pertinent Vitals/Pain Pain Assessment ?Pain Assessment: Faces ?Faces  Pain Scale: Hurts a little bit ?Pain Location: bil Knees arthritic ?Pain Descriptors / Indicators: Aching, Constant ?Pain Intervention(s): Limited activity within patient's tolerance  ? ? ?Home Living   ?  ?  ?  ?  ?  ?  ?  ?  ?  ?   ?  ?Prior Function    ?  ?  ?   ? ?PT Goals (current goals can now be found in the care plan section) Acute Rehab PT Goals ?Patient Stated Goal: get home to her 2 dogs ?Time For Goal Achievement: 12/01/21 ?Potential to Achieve Goals: Good ?Progress towards PT goals: Progressing toward goals ? ?  ?Frequency ? ? ? Min 4X/week ? ? ? ?  ?PT Plan Current plan remains appropriate  ? ? ?Co-evaluation   ?  ?  ?  ?  ? ?  ?AM-PAC PT "6 Clicks" Mobility   ?Outcome Measure ? Help needed turning from your back to your side while in a flat bed without using bedrails?: None ?Help needed moving from lying on your back to sitting on the side of a flat bed without using bedrails?: None ?Help needed moving to and from a bed to a chair (including a wheelchair)?: A Little ?Help needed standing up from a chair using your arms (e.g., wheelchair or bedside chair)?: A Little ?Help needed to walk in hospital room?: A Little ?Help needed climbing 3-5 steps with a railing? : A Little ?6 Click Score: 20 ? ?  ?End of Session Equipment Utilized During Treatment: Gait belt ?Activity Tolerance: Patient tolerated treatment well ?Patient left: in chair;with call bell/phone within reach;with chair alarm set ?Nurse Communication: Mobility status ?PT Visit Diagnosis: Other abnormalities of gait and mobility (R26.89);Other symptoms and signs involving the nervous system (R29.898) ?  ? ? ?Time: 3220-2542 ?PT Time Calculation (min) (ACUTE ONLY): 29 min ? ?Charges:  $Gait Training: 23-37 mins          ?          ? ? ?Veronica Weber, PT ?Acute Rehabilitation Services  ?Pager [phone] ?Office [phone] ? ? ? ?Veronica Weber ?11/18/2021, 10:52 AM ? ?

## 2021-11-18 NOTE — Progress Notes (Signed)
?  Inpatient Rehabilitation Admissions Coordinator  ? ?I met with patient at bedside to discuss goals, expectations and cost of care of a potential CIR admit. She is in agreement, and prefers CIR rather than SNF. I have begun insurance Auth with [facility] medicare. ? ?Danne Baxter, RN, MSN ?Rehab Admissions Coordinator ?[phone] ?11/18/2021 11:28 AM ? ?

## 2021-11-18 NOTE — Progress Notes (Signed)
Occupational Therapy Treatment ?Patient Details ?Name: Veronica Weber ?MRN: [id number] ?DOB: September 05, 1955 ?Today's Date: 11/18/2021 ? ? ?History of present illness 66 y.o. female who presented to the ED 11/16/21 for evaluation of uncontrollable left-sided extremity movement. 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   PMH significant for HTN, HLD, Hypothyroidism, vasovagal syncope, tobacco use ?  ?OT comments ? Patient received sitting in recliner and eager to participate with OT.  Patient demonstrated poor safety with RW use to ambulate to sink with frequent cues for safety and to keep walker close.  Toilet transfers performed with education on safety, hand placement, and walker use. Patient used LUE to brush hair with min assist due to chorea movements. Acute OT to continue to follow.   ? ?Recommendations for follow up therapy are one component of a multi-disciplinary discharge planning process, led by the attending physician.  Recommendations may be updated based on patient status, additional functional criteria and insurance authorization. ?   ?Follow Up Recommendations ? Acute inpatient rehab (3hours/day)  ?  ?Assistance Recommended at Discharge Frequent or constant Supervision/Assistance  ?Patient can return home with the following ? A little help with walking and/or transfers;A little help with bathing/dressing/bathroom;Assistance with cooking/housework;Direct supervision/assist for medications management;Direct supervision/assist for financial management ?  ?Equipment Recommendations ? Other (comment) (TBD)  ?  ?Recommendations for Other Services   ? ?  ?Precautions / Restrictions Precautions ?Precautions: Fall ?Restrictions ?Weight Bearing Restrictions: No  ? ? ?  ? ?Mobility Bed Mobility ?Overal bed mobility: Modified Independent ?  ?  ?  ?  ?  ?  ?General bed mobility  comments: seated in recliner ?  ? ?Transfers ?Overall transfer level: Needs assistance ?Equipment used: Rolling walker (2 wheels) ?Transfers: Sit to/from Stand, Bed to chair/wheelchair/BSC ?Sit to Stand: Min guard ?  ?  ?Step pivot transfers: Min guard ?  ?  ?General transfer comment: frequent cues for safety with RW ?  ?  ?Balance Overall balance assessment: Needs assistance ?Sitting-balance support: No upper extremity supported, Feet supported ?Sitting balance-Leahy Scale: Good ?Sitting balance - Comments: seated in recliner ?  ?Standing balance support: No upper extremity supported ?Standing balance-Leahy Scale: Fair ?Standing balance comment: able to perform hand and face hygiene standing at sink ?  ?  ?  ?  ?  ?  ?  ?  ?  ?  ?  ?   ? ?ADL either performed or assessed with clinical judgement  ? ?ADL Overall ADL's : Needs assistance/impaired ?  ?  ?Grooming: Minimal assistance;Sitting;Wash/dry hands;Wash/dry face;Brushing hair ?Grooming Details (indicate cue type and reason): able to wash hands and face standing at sink with min guard assist for safety. Brushed hair seated in recliner using LUE with min assist ?  ?  ?  ?  ?  ?  ?Lower Body Dressing: Minimal assistance;Sitting/lateral leans ?Lower Body Dressing Details (indicate cue type and reason): changed socks ?Toilet Transfer: Minimal assistance;Ambulation ?Toilet Transfer Details (indicate cue type and reason): requires frequent cues for safety, correct walker use, and for hand placement ?  ?  ?  ?  ?  ?General ADL Comments: Increased assist needed due to chorea movements in LUE and LLE ?  ? ?Extremity/Trunk Assessment Upper Extremity Assessment ?LUE Deficits / Details: Motor planning difficulties alone with Chorea movements ?LUE Sensation: decreased light touch ?LUE Coordination: decreased fine motor;decreased gross motor ?  ?  ?  ?  ?  ? ?  Vision   ?  ?  ?Perception   ?  ?Praxis   ?  ? ?Cognition Arousal/Alertness: Awake/alert ?Behavior During Therapy:  Anxious, Restless ?Overall Cognitive Status: Within Functional Limits for tasks assessed ?  ?  ?  ?  ?  ?  ?  ?  ?  ?  ?  ?  ?  ?  ?  ?  ?General Comments: requires frequent cues for safety and correct use of RW ?  ?  ?   ?Exercises   ? ?  ?Shoulder Instructions   ? ? ?  ?General Comments VSS on RA per monitor  ? ? ?Pertinent Vitals/ Pain       Pain Assessment ?Pain Assessment: Faces ?Faces Pain Scale: Hurts a little bit ?Pain Location: bil Knees arthritic ?Pain Descriptors / Indicators: Aching, Constant ?Pain Intervention(s): Limited activity within patient's tolerance, Monitored during session, Repositioned ? ?Home Living   ?  ?  ?  ?  ?  ?  ?  ?  ?  ?  ?  ?  ?  ?  ?  ?  ?  ?  ? ?  ?Prior Functioning/Environment    ?  ?  ?  ?   ? ?Frequency ? Min 2X/week  ? ? ? ? ?  ?Progress Toward Goals ? ?OT Goals(current goals can now be found in the care plan section) ? Progress towards OT goals: Progressing toward goals ? ?Acute Rehab OT Goals ?Patient Stated Goal: get better ?OT Goal Formulation: With patient ?Time For Goal Achievement: 12/01/21 ?Potential to Achieve Goals: Good ?ADL Goals ?Pt Will Perform Lower Body Bathing: with modified independence;sitting/lateral leans;sit to/from stand ?Pt Will Perform Lower Body Dressing: with modified independence;sitting/lateral leans;sit to/from stand ?Pt Will Transfer to Toilet: with modified independence;ambulating ?Pt Will Perform Toileting - Clothing Manipulation and hygiene: with modified independence;sitting/lateral leans;sit to/from stand ?Additional ADL Goal #1: Pt will complete a functional task using her LUE independently for 3 mins.  ?Plan Discharge plan remains appropriate   ? ?Co-evaluation ? ? ?   ?  ?  ?  ?  ? ?  ?AM-PAC OT "6 Clicks" Daily Activity     ?Outcome Measure ? ? Help from another person eating meals?: A Little ?Help from another person taking care of personal grooming?: A Little ?Help from another person toileting, which includes using toliet, bedpan, or  urinal?: A Little ?Help from another person bathing (including washing, rinsing, drying)?: A Little ?Help from another person to put on and taking off regular upper body clothing?: A Little ?Help from another person to put on and taking off regular lower body clothing?: A Little ?6 Click Score: 18 ? ?  ?End of Session Equipment Utilized During Treatment: Gait belt;Rolling walker (2 wheels) ? ?OT Visit Diagnosis: Unsteadiness on feet (R26.81);Other abnormalities of gait and mobility (R26.89);Muscle weakness (generalized) (M62.81);Other symptoms and signs involving the nervous system (R29.898);Ataxia, unspecified (R27.0) ?  ?Activity Tolerance Patient tolerated treatment well ?  ?Patient Left in chair;with call bell/phone within reach;with chair alarm set ?  ?Nurse Communication Mobility status ?  ? ?   ? ?Time: 6948-5462 ?OT Time Calculation (min): 32 min ? ?Charges: OT General Charges ?$OT Visit: 1 Visit ?OT Treatments ?$Self Care/Home Management : 23-37 mins ? ?Alfonse Flavors, OTA ?Acute Rehabilitation Services  ?Pager [phone] ?Office [phone] ? ? ?Dewain Penning ?11/18/2021, 12:49 PM ?

## 2021-11-18 NOTE — Progress Notes (Addendum)
STROKE TEAM PROGRESS NOTE  ? ?INTERVAL HISTORY ?Patient was seen comfortable and sleeping initially.  Was easily arousable, remained alert during evaluation. She was started on Abilify 5 mg yesterday, to good effect. 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 Not present during sleep.  She had no other concerns. ? ? ?Vitals:  ? 11/17/21 1955 11/18/21 0100 11/18/21 0750 11/18/21 1157  ?BP: 122/87 131/85 119/63 (!) 119/51  ?Pulse: 88  76   ?Resp: 19 20  20   ?Temp: 98 ?F (36.7 ?C) 98.5 ?F (36.9 ?C) 97.8 ?F (36.6 ?C) 98.2 ?F (36.8 ?C)  ?TempSrc: Axillary Oral Oral Oral  ?SpO2: 93% 94% 94%   ? ?CBC:  ?Recent Labs  ?Lab 11/16/21 ?1439 11/17/21 ?0201  ?WBC 7.6 7.8  ?NEUTROABS 4.5  --   ?HGB 13.7 13.8  ?HCT 41.7 40.0  ?MCV 94.1 91.5  ?PLT 324 291  ? ?Basic Metabolic Panel:  ?Recent Labs  ?Lab 11/16/21 ?1439 11/17/21 ?0201  ?NA 138 138  ?K 4.0 4.1  ?CL 101 101  ?CO2 28 29  ?GLUCOSE 102* 100*  ?BUN 23 20  ?CREATININE 0.87 0.82  ?CALCIUM 9.8 9.8  ?MG  --  2.0  ? ?Lipid Panel:  ?Recent Labs  ?Lab 11/17/21 ?0201  ?CHOL 201*  ?TRIG 128  ?HDL 55  ?CHOLHDL 3.7  ?VLDL 26  ?LDLCALC 120*  ? ?HgbA1c:  ?Recent Labs  ?Lab 11/17/21 ?0201  ?HGBA1C 5.8*  ? ?Urine Drug Screen:  ?Recent Labs  ?Lab 11/18/21 ?0100  ?LABOPIA NONE DETECTED  ?COCAINSCRNUR NONE DETECTED  ?LABBENZ NONE DETECTED  ?AMPHETMU NONE DETECTED  ?THCU NONE DETECTED  ?LABBARB NONE DETECTED  ? ?Alcohol Level  ?Recent Labs  ?Lab 11/16/21 ?1820  ?ETH <10  ? ? ?IMAGING past 24 hours ?ECHOCARDIOGRAM COMPLETE ? ?Result Date: 11/17/2021 ?   ECHOCARDIOGRAM REPORT   Patient Name:   Veronica Weber Date of Exam: 11/17/2021 Medical Rec #:  [id number]               Height:       60.0 in Accession #:    [id number]              Weight:       206.4 lb Date of Birth:  April 19, 1956               BSA:          1.892 m? Patient Age:    79 years                BP:           108/70 mmHg Patient Gender: F                       HR:           81 bpm. Exam  Location:  Inpatient Procedure: 2D Echo, Cardiac Doppler and Color Doppler Indications:    Stroke  History:        Patient has prior history of Echocardiogram examinations, most                 recent 06/17/2021. Thyroid disease.  Sonographer:    Merrie Roof RDCS Referring Phys: [id number] [place]  1. Left ventricular ejection fraction, by estimation, is 60 to 65%. The left ventricle has normal function. The left ventricle has no regional wall motion abnormalities. Left ventricular diastolic parameters are consistent with Grade I diastolic dysfunction (impaired relaxation).  2. Right ventricular systolic function is normal. The right ventricular size is normal.  3. The mitral valve is normal in structure. No evidence of mitral valve regurgitation. No evidence of mitral stenosis.  4. The aortic valve is normal in structure. Aortic valve regurgitation is not visualized. No aortic stenosis is present.  5. The inferior vena cava is normal in size with greater than 50% respiratory variability, suggesting right atrial pressure of 3 mmHg. FINDINGS  Left Ventricle: Left ventricular ejection fraction, by estimation, is 60 to 65%. The left ventricle has normal function. The left ventricle has no regional wall motion abnormalities. The left ventricular internal cavity size was normal in size. There is  no left ventricular hypertrophy. Left ventricular diastolic parameters are consistent with Grade I diastolic dysfunction (impaired relaxation). Right Ventricle: The right ventricular size is normal. No increase in right ventricular wall thickness. Right ventricular systolic function is normal. Left Atrium: Left atrial size was normal in size. Right Atrium: Right atrial size was normal in size. Pericardium: There is no evidence of pericardial effusion. Mitral Valve: The mitral valve is normal in structure. Mild mitral annular calcification. No evidence of mitral valve regurgitation. No evidence of mitral valve  stenosis. Tricuspid Valve: The tricuspid valve is normal in structure. Tricuspid valve regurgitation is not demonstrated. No evidence of tricuspid stenosis. Aortic Valve: The aortic valve is normal in structure. Aortic valve regurgitation is not visualized. No aortic stenosis is present. Aortic valve mean gradient measures 3.0 mmHg. Aortic valve peak gradient measures 5.2 mmHg. Aortic valve area, by VTI measures 2.72 cm?. Pulmonic Valve: The pulmonic valve was normal in structure. Pulmonic valve regurgitation is not visualized. No evidence of pulmonic stenosis. Aorta: The aortic root is normal in size and structure. Venous: The inferior vena cava is normal in size with greater than 50% respiratory variability, suggesting right atrial pressure of 3 mmHg. IAS/Shunts: No atrial level shunt detected by color flow Doppler.  LEFT VENTRICLE PLAX 2D LVIDd:         4.00 cm   Diastology LVIDs:         2.70 cm   LV e' medial:    5.10 cm/s LV PW:         1.10 cm   LV E/e' medial:  10.3 LV IVS:        1.10 cm   LV e' lateral:   10.40 cm/s LVOT diam:     2.00 cm   LV E/e' lateral: 5.0 LV SV:         57 LV SV Index:   30 LVOT Area:     3.14 cm?  LEFT ATRIUM           Index        RIGHT ATRIUM           Index LA diam:      3.40 cm 1.80 cm/m?   RA Area:     16.50 cm? LA Vol (A4C): 48.6 ml 25.69 ml/m?  RA Volume:   42.10 ml  22.25 ml/m?  AORTIC VALVE AV Area (Vmax):    2.78 cm? AV Area (Vmean):   2.64 cm? AV Area (VTI):     2.72 cm? AV Vmax:           114.00 cm/s AV Vmean:          76.200 cm/s AV VTI:            0.211 m AV Peak Grad:      5.2  mmHg AV Mean Grad:      3.0 mmHg LVOT Vmax:         101.00 cm/s LVOT Vmean:        64.000 cm/s LVOT VTI:          0.183 m LVOT/AV VTI ratio: 0.87  AORTA Ao Root diam: 3.20 cm MITRAL VALVE MV Area (PHT): 3.42 cm?    SHUNTS MV Decel Time: 222 msec    Systemic VTI:  0.18 m MV E velocity: 52.50 cm/s  Systemic Diam: 2.00 cm MV A velocity: 98.90 cm/s MV E/A ratio:  0.53 Mihai Croitoru MD  Electronically signed by Sanda Klein MD Signature Date/Time: 11/17/2021/6:07:56 PM    Final    ? ?PHYSICAL EXAM ?Constitutional: Appears well-developed and well-nourished. In no apparent distress. ?Psych: Affect appropriate to situation ?Eyes: No scleral injection ?Respiratory: Effort normal, non-labored breathing ?Gait and Station: Deferred ? ?Neuro: ?Mental Status: ?Patient is awake, alert, oriented to person, place, month, year, and situation. ?Patient is able to give a clear and coherent history. Speech is clear, coherent, spontaneous  ?No signs of aphasia or neglect ?Cranial Nerves: ?II: Visual Fields are full. Pupils are equal, round, and reactive to light.   ?III,IV, VI: EOMI without ptosis or diploplia.  ?V: Facial sensation is symmetric to temperature ?VII: Facial movement is symmetric resting and smiling ?VIII: Hearing is intact to voice ?X: Palate elevates symmetrically ?XI: Shoulder shrug and chin turning is symmetric ?XII: Tongue protrudes midline without atrophy or fasciculations ?Motor ?Bulk is normal. 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 4/5 LUE strength. RUE and LE b/l 5/5 strength no drifting.  ?Sensory: ?Sensation is symmetric to light touch and temperature in the arms and legs. No extinction to DSS present.  ?Cerebellar: ?Difficulty with left FNF given involuntary movement. Right FNF in intact. HKS are intact bilaterally  ? ? ? ?ASSESSMENT/PLAN ?Ms. Veronica Weber is a 66 y.o. female with history of HTN, HLD, vasovagal syncope, tobacco abuse admitted for left arm and leg hemichorea.  CT no acute abnormality.  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  MRI showed right MCA scattered small infarcts. CTA head and neck right M1 moderate severe stenosis and mild to moderate right and mild left paraclinoid ICA stenosis.  EF 60 to 65%. Patient had a 30-day CardioNet monitoring in 07/2021 for syncope showed no A-fib.  LDL 120, A1c 5.8.  UDS negative.  Creatinine 0.82. ? ?Etiology for patient stroke likely due  to large vessel disease given right M1 moderate to severe stenosis. Hemicrania unlikely due to acute stroke. Currently on aspirin 81 and Plavix 75 DAPT for 3 weeks and then aspirin alone.  Increase Lipitor 40-80.  On nicotine

## 2021-11-18 NOTE — Plan of Care (Signed)
  Problem: Education: Goal: Knowledge of disease or condition will improve Outcome: Progressing Goal: Knowledge of secondary prevention will improve (SELECT ALL) Outcome: Progressing Goal: Knowledge of patient specific risk factors will improve (INDIVIDUALIZE FOR PATIENT) Outcome: Progressing Goal: Individualized Educational Video(s) Outcome: Progressing   Problem: Coping: Goal: Will verbalize positive feelings about self Outcome: Progressing Goal: Will identify appropriate support needs Outcome: Progressing   

## 2021-11-18 NOTE — Progress Notes (Signed)
? ?PROGRESS NOTE ? ?Veronica Weber [id number] DOB: [birth year] DOA: 11/16/2021 ?PCP: Allwardt, Randa Evens, PA-C ? ?HPI/Recap of past 24 hours: ?Veronica Weber is a 66 y.o. female with medical history significant for hypertension, hyperlipidemia, history of vasovagal syncope, and tobacco use disorder who presented to the ED for evaluation of uncontrollable left-sided involuntary movement.   ? ?Work-up revealed patchy and scattered small acute infarcts in the right MCA territory, primarily the posterior right MCA deviation with some MCA/PCA watershed involved.  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  No associated hemorrhage or mass effect. ? ?Seen by neurology. ? ?11/18/2021: Patient was seen and examined at bedside.  States she feels woozy.  Her left-sided hemichorea is much improved.  Seen by neurology. ? ? ?Assessment/Plan: ?Principal Problem: ?  Left-sided hemichorea ?Active Problems: ?  Hypertension ?  Hyperlipidemia ?  Tobacco use ?  Cerebral embolism with cerebral infarction ?  Acute CVA (cerebrovascular accident) ([place]) ? ?Left-sided hemichorea  ?Acute CVA seen on MRI brain ?Patient presenting with left-sided hemichorea beginning 3/17 AM. CT head negative for acute intracranial abnormality.  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   ?MRI brain revealed acute CVAs as stated above. ?Stroke work-up in place ?MRA brain and bilateral Doppler carotid ultrasound >>moderate to severe R M1 stenosis . ?LDL 120, goal less than 70 ?Hemoglobin A1c 5.8, goal less than 7.0 ?Continue aspirin and Plavix x21 days, then Plavix or aspirin alone indefinitely ?Continue high intensity statin, Lipitor 80 mg daily. ?Allow for permissive hypertension, treat SBP greater than 220 or DBP greater than 120, gradually normalize  BP. ?Continue frequent neurochecks. ?PT OT/speech assessment. ?Plan to discharge to CIR once bed is available ? ?Hypertension ?Holding home chlorthalidone and losartan to allow for permissive hypertension for now. ?IV fluid hydration LR at 50 cc/h. ?  ?Hyperlipidemia ?Continue atorvastatin. ?  ?Tobacco use ?Patient is smoking 1 pack/day for at least 45 years.  Smoking cessation advised.  Nicotine patch provided.  She is planning on starting Chantix as prescribed by her PCP but has not yet picked it up ?  ?DVT prophylaxis: enoxaparin (LOVENOX) injection 40 mg Start: 11/16/21 2000 ?Code Status: Full code, confirmed with patient on admission. ?Family Communication: None at this time.   ? ?Disposition Plan: We will discharge to CIR once insurance approves and bed is available. ? ?Consults called: Neurology ? ?Severity of Illness: ? ? ? ? ? ?Status is: Observation.  Patient requires at least 2 midnights for further evaluation and treatment of present condition. ? ? ? ? ?Objective: ?Vitals:  ? 11/17/21 1955 11/18/21 0100 11/18/21 0750 11/18/21 1157  ?BP: 122/87 131/85 119/63 (!) 119/51  ?Pulse: 88  76   ?Resp: 19 20  20   ?Temp: 98 ?F (36.7 ?C) 98.5 ?F (36.9 ?C) 97.8 ?F (36.6 ?C) 98.2 ?F (36.8 ?C)  ?TempSrc: Axillary Oral Oral Oral  ?SpO2: 93% 94% 94%   ? ? ?Intake/Output Summary (Last 24 hours) at 11/18/2021 1613 ?Last data filed at 11/18/2021 0600 ?Gross per 24 hour  ?Intake 294 ml  ?Output 700 ml  ?Net -406 ml  ? ?Filed Weights  ? ? ?Exam: ? ?General: 66 y.o. year-old female well-developed well-nourished in no acute distress.  She is alert and oriented x3. ?Cardiovascular: Regular rate and rhythm no rubs or gallops.   ?Respiratory: Clear to auscultation no wheezes or rales.  ?Abdomen: Soft monitor  normal bowel sounds present.  ?Musculoskeletal: No lower extremity edema bilaterally.   ?Skin: No ulcerative lesions noted. ?Psychiatry: Mood is appropriate for condition and setting. ? ? ?Data Reviewed: ?CBC: ?Recent Labs  ?Lab  11/16/21 ?1439 11/17/21 ?0201  ?WBC 7.6 7.8  ?NEUTROABS 4.5  --   ?HGB 13.7 13.8  ?HCT 41.7 40.0  ?MCV 94.1 91.5  ?PLT 324 291  ? ?Basic Metabolic Panel: ?Recent Labs  ?Lab 11/16/21 ?1439 11/17/21 ?0201  ?NA 138 138  ?K 4.0 4.1  ?CL 101 101  ?CO2 28 29  ?GLUCOSE 102* 100*  ?BUN 23 20  ?CREATININE 0.87 0.82  ?CALCIUM 9.8 9.8  ?MG  --  2.0  ? ?GFR: ?CrCl cannot be calculated (Unknown ideal weight.). ?Liver Function Tests: ?Recent Labs  ?Lab 11/16/21 ?1439  ?AST 19  ?ALT 19  ?ALKPHOS 105  ?BILITOT 0.5  ?PROT 6.7  ?ALBUMIN 3.8  ? ?No results for input(s): LIPASE, AMYLASE in the last 168 hours. ?No results for input(s): AMMONIA in the last 168 hours. ?Coagulation Profile: ?Recent Labs  ?Lab 11/16/21 ?1820  ?INR 0.9  ? ?Cardiac Enzymes: ?No results for input(s): CKTOTAL, CKMB, CKMBINDEX, TROPONINI in the last 168 hours. ?BNP (last 3 results) ?No results for input(s): PROBNP in the last 8760 hours. ?HbA1C: ?Recent Labs  ?  11/17/21 ?0201  ?HGBA1C 5.8*  ? ?CBG: ?No results for input(s): GLUCAP in the last 168 hours. ?Lipid Profile: ?Recent Labs  ?  11/17/21 ?0201  ?CHOL 201*  ?HDL 55  ?LDLCALC 120*  ?TRIG 128  ?CHOLHDL 3.7  ? ?Thyroid Function Tests: ?No results for input(s): TSH, T4TOTAL, FREET4, T3FREE, THYROIDAB in the last 72 hours. ?Anemia Panel: ?Recent Labs  ?  11/17/21 ?0201  ?VITAMINB12 307  ? ?Urine analysis: ?   ?Component Value Date/Time  ? COLORURINE YELLOW 11/18/2021 0100  ? APPEARANCEUR CLEAR 11/18/2021 0100  ? LABSPEC 1.016 11/18/2021 0100  ? PHURINE 5.0 11/18/2021 0100  ? GLUCOSEU NEGATIVE 11/18/2021 0100  ? [place] NEGATIVE 11/18/2021 0100  ? [place] NEGATIVE 11/18/2021 0100  ? BILIRUBINUR negative 10/15/2020 1151  ? [place] NEGATIVE 11/18/2021 0100  ? PROTEINUR NEGATIVE 11/18/2021 0100  ? UROBILINOGEN 0.2 10/15/2020 1151  ? NITRITE NEGATIVE 11/18/2021 0100  ? LEUKOCYTESUR NEGATIVE 11/18/2021 0100  ? ?Sepsis Labs: ?@LABRCNTIP (procalcitonin:4,lacticidven:4) ? ?) ?Recent Results (from the past 240  hour(s))  ?Resp Panel by RT-PCR (Flu A&B, Covid) Nasopharyngeal Swab     Status: None  ? Collection Time: 11/16/21  2:18 PM  ? Specimen: Nasopharyngeal Swab; Nasopharyngeal(NP) swabs in vial transport medium  ?Result Value Ref Range Status  ? SARS Coronavirus 2 by RT PCR NEGATIVE NEGATIVE Final  ?  Comment: (NOTE) ?SARS-CoV-2 target nucleic acids are NOT DETECTED. ? ?The SARS-CoV-2 RNA is generally detectable in upper respiratory ?specimens during the acute phase of infection. The lowest ?concentration of SARS-CoV-2 viral copies this assay can detect is ?138 copies/mL. A negative result does not preclude SARS-Cov-2 ?infection and should not be used as the sole basis for treatment or ?other patient management decisions. A negative result may occur with  ?improper specimen collection/handling, submission of specimen other ?than nasopharyngeal swab, presence of viral mutation(s) within the ?areas targeted by this assay, and inadequate number of viral ?copies(<138 copies/mL). A negative result must be combined with ?clinical observations, patient history, and epidemiological ?information. The expected result is Negative. ? ?Fact Sheet for Patients:  ?EntrepreneurPulse.com.au ? ?Fact Sheet for Healthcare Providers:  ?IncredibleEmployment.be ? ?This test is no t yet approved or cleared by  the [employer] and  ?has been authorized for detection and/or diagnosis of SARS-CoV-2 by ?FDA under an Emergency Use Authorization (EUA). This EUA will remain  ?in effect (meaning this test can be used) for the duration of the ?COVID-19 declaration under Section 564(b)(1) of the Act, 21 ?U.S.C.section 360bbb-3(b)(1), unless the authorization is terminated  ?or revoked sooner.  ? ? ?  ? Influenza A by PCR NEGATIVE NEGATIVE Final  ? Influenza B by PCR NEGATIVE NEGATIVE Final  ?  Comment: (NOTE) ?The Xpert Xpress SARS-CoV-2/FLU/RSV plus assay is intended as an aid ?in the diagnosis of influenza from  Nasopharyngeal swab specimens and ?should not be used as a sole basis for treatment. Nasal washings and ?aspirates are unacceptable for Xpert Xpress SARS-CoV-2/FLU/RSV ?testing. ? ?Fact Sheet for Patients: ?https:

## 2021-11-19 ENCOUNTER — Other Ambulatory Visit: Payer: Self-pay

## 2021-11-19 ENCOUNTER — Other Ambulatory Visit (HOSPITAL_COMMUNITY): Payer: Self-pay

## 2021-11-19 ENCOUNTER — Inpatient Hospital Stay (HOSPITAL_COMMUNITY)
Admission: RE | Admit: 2021-11-19 | Discharge: 2021-11-23 | DRG: 057 | Disposition: A | Discharge status: Home Health | Payer: Medicare Other | Source: Intra-hospital | Attending: Physical Medicine and Rehabilitation | Admitting: Physical Medicine and Rehabilitation

## 2021-11-19 ENCOUNTER — Encounter (HOSPITAL_COMMUNITY): Payer: Self-pay | Admitting: Physical Medicine and Rehabilitation

## 2021-11-19 DIAGNOSIS — R5381 Other malaise: Secondary | ICD-10-CM | POA: Diagnosis present

## 2021-11-19 DIAGNOSIS — I63511 Cerebral infarction due to unspecified occlusion or stenosis of right middle cerebral artery: Secondary | ICD-10-CM

## 2021-11-19 DIAGNOSIS — E785 Hyperlipidemia, unspecified: Secondary | ICD-10-CM | POA: Diagnosis present

## 2021-11-19 DIAGNOSIS — Z7982 Long term (current) use of aspirin: Secondary | ICD-10-CM | POA: Diagnosis not present

## 2021-11-19 DIAGNOSIS — Z716 Tobacco abuse counseling: Secondary | ICD-10-CM | POA: Diagnosis not present

## 2021-11-19 DIAGNOSIS — Z6841 Body Mass Index (BMI) 40.0 and over, adult: Secondary | ICD-10-CM

## 2021-11-19 DIAGNOSIS — M549 Dorsalgia, unspecified: Secondary | ICD-10-CM | POA: Diagnosis present

## 2021-11-19 DIAGNOSIS — M170 Bilateral primary osteoarthritis of knee: Secondary | ICD-10-CM | POA: Diagnosis present

## 2021-11-19 DIAGNOSIS — F17210 Nicotine dependence, cigarettes, uncomplicated: Secondary | ICD-10-CM | POA: Diagnosis present

## 2021-11-19 DIAGNOSIS — Z79899 Other long term (current) drug therapy: Secondary | ICD-10-CM | POA: Diagnosis not present

## 2021-11-19 DIAGNOSIS — G8929 Other chronic pain: Secondary | ICD-10-CM | POA: Diagnosis present

## 2021-11-19 DIAGNOSIS — G255 Other chorea: Secondary | ICD-10-CM | POA: Diagnosis present

## 2021-11-19 DIAGNOSIS — F419 Anxiety disorder, unspecified: Secondary | ICD-10-CM | POA: Diagnosis present

## 2021-11-19 DIAGNOSIS — M1990 Unspecified osteoarthritis, unspecified site: Secondary | ICD-10-CM | POA: Diagnosis present

## 2021-11-19 DIAGNOSIS — I69398 Other sequelae of cerebral infarction: Secondary | ICD-10-CM | POA: Diagnosis not present

## 2021-11-19 DIAGNOSIS — I10 Essential (primary) hypertension: Secondary | ICD-10-CM | POA: Diagnosis present

## 2021-11-19 DIAGNOSIS — K5900 Constipation, unspecified: Secondary | ICD-10-CM | POA: Diagnosis present

## 2021-11-19 DIAGNOSIS — Z9049 Acquired absence of other specified parts of digestive tract: Secondary | ICD-10-CM | POA: Diagnosis not present

## 2021-11-19 MED ORDER — ACETAMINOPHEN 650 MG RE SUPP: 650.0000 mg | RECTAL | Status: DC | PRN

## 2021-11-19 MED ORDER — ASPIRIN EC 325 MG PO TBEC
325.0000 mg | DELAYED_RELEASE_TABLET | Freq: Every day | ORAL | Status: DC
  Administered 2021-11-20 – 2021-11-21 (×2): 325 mg via ORAL
  Filled 2021-11-19 (×2): qty 1

## 2021-11-19 MED ORDER — NICOTINE 21 MG/24HR TD PT24
21.0000 mg | MEDICATED_PATCH | Freq: Every day | TRANSDERMAL | Status: DC
  Administered 2021-11-20 – 2021-11-23 (×4): 21 mg via TRANSDERMAL
  Filled 2021-11-19 (×4): qty 1

## 2021-11-19 MED ORDER — ACETAMINOPHEN 160 MG/5ML PO SOLN
650.0000 mg | ORAL | Status: DC | PRN
Start: 2021-11-19 — End: 2021-11-23

## 2021-11-19 MED ORDER — ENOXAPARIN SODIUM 40 MG/0.4ML IJ SOSY
40.0000 mg | PREFILLED_SYRINGE | INTRAMUSCULAR | Status: DC
  Administered 2021-11-19 – 2021-11-20 (×2): 40 mg via SUBCUTANEOUS
  Filled 2021-11-19 (×2): qty 0.4

## 2021-11-19 MED ORDER — ACETAMINOPHEN 325 MG PO TABS
650.0000 mg | ORAL_TABLET | ORAL | Status: DC | PRN
  Administered 2021-11-21: 650 mg via ORAL
  Filled 2021-11-19: qty 2

## 2021-11-19 MED ORDER — ENOXAPARIN SODIUM 40 MG/0.4ML IJ SOSY: 40.0000 mg | PREFILLED_SYRINGE | INTRAMUSCULAR | Status: DC

## 2021-11-19 MED ORDER — EXERCISE FOR HEART AND HEALTH BOOK
Freq: Once | Status: AC
  Filled 2021-11-19 (×2): qty 1

## 2021-11-19 MED ORDER — ALBUTEROL SULFATE (2.5 MG/3ML) 0.083% IN NEBU: 3.0000 mL | INHALATION_SOLUTION | Freq: Four times a day (QID) | RESPIRATORY_TRACT | Status: DC | PRN

## 2021-11-19 MED ORDER — SENNOSIDES-DOCUSATE SODIUM 8.6-50 MG PO TABS: 1.0000 | ORAL_TABLET | Freq: Every evening | ORAL | Status: DC | PRN

## 2021-11-19 MED ORDER — BLOOD PRESSURE CONTROL BOOK
Freq: Once | Status: AC
  Filled 2021-11-19 (×2): qty 1

## 2021-11-19 MED ORDER — ATORVASTATIN CALCIUM 80 MG PO TABS
80.0000 mg | ORAL_TABLET | Freq: Every day | ORAL | Status: DC
  Administered 2021-11-20 – 2021-11-23 (×4): 80 mg via ORAL
  Filled 2021-11-19 (×4): qty 1

## 2021-11-19 MED ORDER — ARIPIPRAZOLE 5 MG PO TABS
5.0000 mg | ORAL_TABLET | Freq: Every day | ORAL | Status: DC
  Administered 2021-11-20 – 2021-11-23 (×4): 5 mg via ORAL
  Filled 2021-11-19 (×4): qty 1

## 2021-11-19 MED ORDER — CLOPIDOGREL BISULFATE 75 MG PO TABS
75.0000 mg | ORAL_TABLET | Freq: Every day | ORAL | Status: DC
  Administered 2021-11-20 – 2021-11-23 (×4): 75 mg via ORAL
  Filled 2021-11-19 (×4): qty 1

## 2021-11-19 NOTE — Discharge Instructions (Addendum)
Inpatient Rehab Discharge Instructions ? ?Kelsei Defino Hajizadeh-Amini ?Discharge date and time: No discharge date for patient encounter.  ? ?Activities/Precautions/ Functional Status: ?Activity: activity as tolerated ?Diet: regular diet ?Wound Care: Routine skin checks ?Functional status:  ?___ No restrictions     ___ Walk up steps independently ?___ 24/7 supervision/assistance   ___ Walk up steps with assistance ?___ Intermittent supervision/assistance  ___ Bathe/dress independently ?___ Walk with walker     _x__ Bathe/dress with assistance ?___ Walk Independently    ___ Shower independently ?___ Walk with assistance    ___ Shower with assistance ?___ No alcohol     ___ Return to work/school ________ ? ?Special Instructions: ?No driving smoking or alcohol     ? ? ?COMMUNITY REFERRALS UPON DISCHARGE:   ? ?Home Health:   PT & OT ?               Agency: [facility] Phone:[phone]  ? ? ? ?Medical Equipment/Items Ordered:3 IN 1 ?                                                Agency/Supplier:[facility]  [phone] ? ? ? ?STROKE/TIA DISCHARGE INSTRUCTIONS ?SMOKING Cigarette smoking nearly doubles your risk of having a stroke & is the single most alterable risk factor  ?If you smoke or have smoked in the last 12 months, you are advised to quit smoking for your health. Most of the excess cardiovascular risk related to smoking disappears within a year of stopping. ?Ask you doctor about anti-smoking medications ?[place] Quit Line: 1-800-QUIT NOW ?Free Smoking Cessation Classes [phone]  ?CHOLESTEROL Know your levels; limit fat & cholesterol in your diet  ?Lipid Panel  ?   ?Component Value Date/Time  ? CHOL 201 (H) 11/17/2021 0201  ? CHOL 186 10/15/2021 1017  ? TRIG 128 11/17/2021 0201  ? HDL 55 11/17/2021 0201  ? HDL 66 10/15/2021 1017  ? CHOLHDL 3.7 11/17/2021 0201  ? VLDL 26 11/17/2021 0201  ? LDLCALC 120 (H) 11/17/2021 0201  ? LDLCALC 102 (H) 10/15/2021 1017  ? ? ? Many patients benefit from treatment even if  their cholesterol is at goal. ?Goal: Total Cholesterol (CHOL) less than 160 ?Goal:  Triglycerides (TRIG) less than 150 ?Goal:  HDL greater than 40 ?Goal:  LDL (LDLCALC) less than 100 ?  ?BLOOD PRESSURE American Stroke Association blood pressure target is less that 120/80 mm/Hg  ?Your discharge blood pressure is:    Monitor your blood pressure ?Limit your salt and alcohol intake ?Many individuals will require more than one medication for high blood pressure  ?DIABETES (A1c is a blood sugar average for last 3 months) Goal HGBA1c is under 7% (HBGA1c is blood sugar average for last 3 months)  ?Diabetes: ?No known diagnosis of diabetes   ? ?Lab Results  ?Component Value Date  ? HGBA1C 5.8 (H) 11/17/2021  ? ? Your HGBA1c can be lowered with medications, healthy diet, and exercise. ?Check your blood sugar as directed by your physician ?Call your physician if you experience unexplained or low blood sugars.  ?PHYSICAL ACTIVITY/REHABILITATION Goal is 30 minutes at least 4 days per week  ?Activity: Increase activity slowly, ?Therapies: Physical Therapy: Home Health ?Return to work:  Activity decreases your risk of heart attack and stroke and makes your heart stronger.  It helps control your weight and blood pressure;  helps you relax and can improve your mood. ?Participate in a regular exercise program. ?Talk with your doctor about the best form of exercise for you (dancing, walking, swimming, cycling).  ?DIET/WEIGHT Goal is to maintain a healthy weight  ?Your discharge diet is:  ?Diet Order   ? ?       ?  Diet Heart Room service appropriate? Yes; Fluid consistency: Thin  Diet effective now       ?  ? ?  ?  ? ?  ?  liquids ?Your height is:    ?Your current weight is:   ?Your Body Mass Index (BMI) is:    Following the type of diet specifically designed for you will help prevent another stroke. ?Your goal weight range is:   ?Your goal Body Mass Index (BMI) is 19-24. ?Healthy food habits can help reduce 3 risk factors for stroke:   High cholesterol, hypertension, and excess weight.  ?RESOURCES Stroke/Support Group:  Call [phone] ?  ?STROKE EDUCATION PROVIDED/REVIEWED AND GIVEN TO PATIENT Stroke warning signs and symptoms ?How to activate emergency medical system (call 911). ?Medications prescribed at discharge. ?Need for follow-up after discharge. ?Personal risk factors for stroke. ?Pneumonia vaccine given: No ?Flu vaccine given: No ?My questions have been answered, the writing is legible, and I understand these instructions.  I will adhere to these goals & educational materials that have been provided to me after my discharge from the hospital.  ? ?  ? ? ?My questions have been answered and I understand these instructions. I will adhere to these goals and the provided educational materials after my discharge from the hospital. ? ?Patient/Caregiver Signature _______________________________ Date __________ ? ?Clinician Signature _______________________________________ Date __________ ? ?Please bring this form and your medication list with you to all your follow-up doctor's appointments.   ?

## 2021-11-19 NOTE — Progress Notes (Signed)
Report given to Kennyth Lose, Therapist, sports. Patient transported to 5C08 ?

## 2021-11-19 NOTE — Progress Notes (Signed)
Patient arrived via W/C from 5W. Patient is A&O x 4 and able to make her needs known. Patient denies pain or discomfort at this time. Patient has a burn on her left arm measuring 6 x.2 and 6 x .7. NS wash pat dry applied non adhesive and foam lift dressing. Patient has hematoma in left antecubital. ABD soft non distended, continent of B&B.Patient states she had a large BM yesterday. Patient has personal belongings at bed side, purse, wallet with with $15.00 and credit cards. Encouraged patient to lock items up. Patient declined states my purse stays with me. Understands the use of the call light. Personal items within reach, safety maintained.  ?

## 2021-11-19 NOTE — H&P (Signed)
?  ?Physical Medicine and Rehabilitation Admission H&P ?  ?  ?   ?Chief Complaint  ?Patient presents with  ? Tremors  ? Anxiety  ?: ?HPI: Veronica BowlDiana J.Roswell Weber is a 66 year old right-handed female with history of hypertension hyperlipidemia, osteoarthritis bilateral knees, history of vasovagal syncope and tobacco use.  Per chart review patient lives alone independent prior to admission 1 level home 5 steps to entry.  She has multiple friends and family in the area to provide assistance.  By report patient went to bed on Thursday 3/16 and woke up on Friday with choreiform involuntary movements in her left arm and leg.  She was admitted 11/16/2021 with CT/MRI of the brain showing patchy and scattered small acute infarcts in the right MCA territory primarily the posterior right MCA division with some MCA/PCA watershed involvement.  No associated hemorrhage or mass effect.  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  CT angiogram head and neck no large vessel occlusion.  Echocardiogram with ejection fraction of 60 to 65% no wall motion abnormalities grade 1 diastolic dysfunction.  Admission chemistries unremarkable except glucose 102, alcohol negative, urine drug screen negative, urinalysis negative nitrite.  Patient has been placed on aspirin 325 mg daily and Plavix 75 mg day x3 months then aspirin alone.  Subcutaneous Lovenox for DVT prophylaxis.  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  Maintain on a regular consistency diet.  Therapy evaluations completed due to patient decreased functional mobility was admitted for a comprehensive rehab program. ?  ?Review of Systems  ?Constitutional:  Negative for chills and fever.  ?HENT:  Negative for hearing loss.   ?Eyes:  Negative for blurred vision and double vision.  ?Respiratory:  Negative for cough and shortness of  breath.   ?Cardiovascular:  Negative for chest pain, palpitations and leg swelling.  ?Gastrointestinal:  Positive for constipation. Negative for heartburn and nausea.  ?Genitourinary:  Negative for dysuria, flank pain and hematuria.  ?Musculoskeletal:  Positive for back pain and myalgias.  ?     Chronic back pain from a fall early March/2023.  ?Skin:  Negative for rash.  ?Neurological:   ?     Involuntary movements left arm and leg  ?All other systems reviewed and are negative. ?    ?Past Medical History:  ?Diagnosis Date  ? Arthritis    ? Cataract    ? Hyperlipidemia    ? Hypertension    ? Thyroid disease    ? Tobacco use    ?  ?     ?Past Surgical History:  ?Procedure Laterality Date  ? CATARACT EXTRACTION      ?  1997, 1998  ? CESAREAN SECTION   1983  ? CHOLECYSTECTOMY   1998  ? TUBAL LIGATION   1994  ?  ?     ?Family History  ?Problem Relation Age of Onset  ? Arthritis Mother    ? Cancer Maternal Grandmother    ? Diabetes Maternal Grandmother    ? Diabetes Maternal Grandfather    ? Healthy Daughter    ? Healthy Son    ?  ?Social History:  reports that she has been smoking cigarettes. She started smoking about 45 years ago. She has a 22.50 pack-year smoking history. She has never used smokeless tobacco. She reports that she does not currently use alcohol. She reports that she  does not use drugs. ?Allergies: No Known Allergies ?      ?Medications Prior to Admission  ?Medication Sig Dispense Refill  ? aspirin 325 MG tablet Take 325 mg by mouth daily as needed for moderate pain.      ? atorvastatin (LIPITOR) 40 MG tablet Take 1 tablet (40 mg total) by mouth daily. 90 tablet 3  ? chlorthalidone (HYGROTON) 25 MG tablet Take 1 tablet (25 mg total) by mouth daily. 90 tablet 3  ? ibuprofen (ADVIL) 200 MG tablet Take 200 mg by mouth every 6 (six) hours as needed for moderate pain or mild pain.      ? losartan (COZAAR) 50 MG tablet Take 1 tablet (50 mg total) by mouth daily. 90 tablet 3  ? varenicline (CHANTIX CONTINUING  MONTH PAK) 1 MG tablet Take 1 tablet (1 mg total) by mouth 2 (two) times daily. (Patient not taking: Reported on 09/04/2021) 60 tablet 1  ? varenicline (CHANTIX PAK) 0.5 MG X 11 & 1 MG X 42 tablet Take one 0.5 mg tablet by mouth once daily for 3 days, then increase to one 0.5 mg tablet twice daily for 4 days, then increase to one 1 mg tablet twice daily. (Patient not taking: Reported on 09/04/2021) 53 tablet 0  ?  ?  ?  ?  ?Home: ?Home Living ?Family/patient expects to be discharged to:: Private residence ?Living Arrangements:  (daughter in [place] and son in [place]) ?Available Help at Discharge: Family, Available PRN/intermittently ?Type of Home: House ?Home Access: Stairs to enter ?Entrance Stairs-Number of Steps: 5 ?Entrance Stairs-Rails: Right, Left, Can reach both ?Home Layout: One level ?Bathroom Shower/Tub: Walk-in shower ?Bathroom Toilet: Standard ?Bathroom Accessibility: Yes ?Home Equipment: Cane - quad ? Lives With: Alone ?  ?Functional History: ?Prior Function ?Prior Level of Function : Independent/Modified Independent, Driving ?Mobility Comments: uses cane sometimes when bil knee arthritis hurting ?ADLs Comments: independent ?  ?Functional Status:  ?Mobility: ?Bed Mobility ?Overal bed mobility: Modified Independent ?General bed mobility comments: seated in recliner ?Transfers ?Overall transfer level: Needs assistance ?Equipment used: Rolling walker (2 wheels) ?Transfers: Sit to/from Stand, Bed to chair/wheelchair/BSC ?Sit to Stand: Min guard ?Bed to/from chair/wheelchair/BSC transfer type:: Step pivot ?Step pivot transfers: Min guard ?General transfer comment: frequent cues for safety with RW ?Ambulation/Gait ?Ambulation/Gait assistance: Min guard ?Gait Distance (Feet): 250 Feet ?Assistive device: None ?Gait Pattern/deviations: Step-through pattern, Decreased stride length (incr lateral sway) ?General Gait Details: no difficulty placing LLE; incr lateral sway per her baseline; when attempted RW, pt lifting RW  (?due to impulsivity vs sporadic LUE movements), did well without device ?Gait velocity: decr ?Gait velocity interpretation: 1.31 - 2.62 ft/sec, indicative of limited community ambulator ?  ?ADL: ?ADL ?Overall ADL's : Needs assistance/impaired ?Eating/Feeding: Minimal assistance, Sitting ?Grooming: Minimal assistance, Sitting, Wash/dry hands, Wash/dry face, Brushing hair ?Grooming Details (indicate cue type and reason): able to wash hands and face standing at sink with min guard assist for safety. Brushed hair seated in recliner using LUE with min assist ?Upper Body Bathing: Minimal assistance, Sitting ?Lower Body Bathing: Moderate assistance, Sitting/lateral leans, Sit to/from stand ?Upper Body Dressing : Minimal assistance, Sitting ?Lower Body Dressing: Minimal assistance, Sitting/lateral leans ?Lower Body Dressing Details (indicate cue type and reason): changed socks ?Toilet Transfer: Minimal assistance, Ambulation ?Toilet Transfer Details (indicate cue type and reason): requires frequent cues for safety, correct walker use, and for hand placement ?Toileting- Clothing Manipulation and Hygiene: Moderate assistance, Sitting/lateral lean, Sit to/from stand ?Functional mobility during ADLs: Minimal assistance, Rolling  walker (2 wheels) ?General ADL Comments: Increased assist needed due to chorea movements in LUE and LLE ?  ?Cognition: ?Cognition ?Overall Cognitive Status: Within Functional Limits for tasks assessed ?Orientation Level: Oriented X4 ?Year: 2023 ?Month: March ?Day of Week: Correct ?Attention: Focused, Sustained ?Focused Attention: Appears intact ?Sustained Attention: Appears intact ?Memory: Appears intact ?Problem Solving: Appears intact ?Executive Function: Reasoning ?Reasoning: Appears intact ?Cognition ?Arousal/Alertness: Awake/alert ?Behavior During Therapy: Anxious, Restless ?Overall Cognitive Status: Within Functional Limits for tasks assessed ?General Comments: requires frequent cues for safety  and correct use of RW ?  ?Physical Exam: ?Blood pressure (!) 135/58, pulse 75, temperature 98.2 ?F (36.8 ?C), temperature source Oral, resp. rate 20, SpO2 93 %. ?Physical Exam ?Vitals and nursing note revie

## 2021-11-19 NOTE — Discharge Summary (Signed)
?Physician Discharge Summary ?  ?Patient: Veronica Weber MRN: [id number] DOB: 1956/01/25  ?Admit date:     11/16/2021  ?Discharge date: 11/19/21  ?Discharge Physician: Alberteen SamChristopher P Joseph Bias  ? ?PCP: Allwardt, Crist InfanteAlyssa M, PA-C  ? ?Recommendations at discharge:  ?Resume Chantix ?See PCP for post-hospital follow up in 1 week after dichscarge from CIR ?[facility], referral sent ? ? ? ? ? ?Discharge Diagnoses: ?Principal Problem: ?  Acute CVA (cerebrovascular accident) (HCC) ?Active Problems: ?  Hypertension ?  Hyperlipidemia ?  Tobacco use ?  Left-sided hemichorea ?  Obesity, Class III, BMI 40-49.9 (morbid obesity) (HCC) ?  ? ? ? ? ? ?Hospital Course: ?[name] is a 66 y.o. F with HTN, smoking who presented for acute onset abnormal left arm movements. ? ?MRI brain showed small hypodensity in the L posterior frontal/parietal lobes suggesting stroke. ? ?Neurology were consulted.   ?-MRI brain showed scattered right frontal/parietal infarcts ?- Non-invasive angiography moderate to severe R M1 stenosis, likely source of strokes ?-Echocardiogram showed no cardiogenic source of embolism ?-Carotid imaging unremarkable   ?-Lipids ordered: discharged on increased dose atorvastatin ?-Aspirin ordered at admission --> discharged on aspirin 81 plus Plavix for 3 weeks then aspirin alone ?-Atrial fibrillation: not present ?-tPA not given because Outside window ?-Dysphagia screen ordered in ER ?-PT eval ordered: recommended inpatient rehab ?-Smoking cessation: recommended, recommend Chantix on discharge ?-Resume losartan for HTN tomorrow, chlorthalidone 25 mg in 2-3 days ? ? ? ? ? ?  ? ?Pain control - [employer]  Controlled Substance Reporting System database was reviewed.   ? ? ? ?Consultants: Neurology ?  ?Disposition:  Inpatient Rehab ? ? ?DISCHARGE MEDICATION: ?Allergies as of 11/19/2021   ?No Known Allergies ?  ? ?  ?Medication List  ?  ? ?STOP taking these medications    ? ?aspirin 325 MG tablet ?Replaced by: Aspirin Low Dose 81 MG EC tablet ?  ?chlorthalidone 25 MG tablet ?Commonly known as: HYGROTON ?  ?ibuprofen 200 MG tablet ?Commonly known as: ADVIL ?  ?varenicline 0.5 MG X 11 & 1 MG X 42 tablet ?Commonly known as: CHANTIX PAK ?  ?varenicline 1 MG tablet ?Commonly known as: Chantix Continuing Month Pak ?  ? ?  ? ?TAKE these medications   ? ?ARIPiprazole 5 MG tablet ?Commonly known as: ABILIFY ?Take 1 tablet (5 mg total) by mouth daily. ?  ?Aspirin Low Dose 81 MG EC tablet ?Generic drug: aspirin ?Take 1 tablet (81 mg total) by mouth daily. Swallow whole. ?Replaces: aspirin 325 MG tablet ?  ?atorvastatin 80 MG tablet ?Commonly known as: LIPITOR ?Take 1 tablet (80 mg total) by mouth daily. ?What changed:  ?medication strength ?how much to take ?  ?clopidogrel 75 MG tablet ?Commonly known as: PLAVIX ?Take 1 tablet (75 mg total) by mouth daily for 21 days. ?  ?losartan 50 MG tablet ?Commonly known as: COZAAR ?Take 1 tablet (50 mg total) by mouth daily. ?  ?nicotine 21 mg/24hr patch ?Commonly known as: NICODERM CQ - dosed in mg/24 hours ?Place 1 patch (21 mg total) onto the skin daily. ?  ? ?  ? ? Follow-up Information   ? ? Ocie Doynehima, Jennifer, MD. Schedule an appointment as soon as possible for a visit in 1 month(s).   ?Specialty: Neurology ?Contact information: ?[address], [address] ?[place] [id number] ?[phone] ? ? ?  ?  ? ? Allwardt, Alyssa M, PA-C. Call today.   ?Specialty: Physician Assistant ?Why: Please  call for a posthospital follow-up appointment. ?Contact information: ?[address] ?[place] [address] ?[phone] ? ? ?  ?  ? ?  ?  ? ?  ? ?Discharge Instructions   ? ? Ambulatory referral to Neurology   Complete by: As directed ?  ? Follow up with Dr. Delena Bali at [facility] in 4 weeks. Pt is Dr. Quentin Mulling pt. Thanks.  ? Discharge instructions   Complete by: As directed ?  ? From Dr. Maryfrances Bunnell: ?You were admitted for a stroke ?The abnormal movements in your left side  are not common with strokes, but our Neurologist felt they were related. ? ?Your stroke was likely from fatty plaque buildup in the arteries leading to the neck. ? ?To help the weakness from the current stroke get better, go to rehab. ? ?To reduce the chance of another stroke: ?Take the medicines aspirin and plavix. ?These "grease" platelets, so they don't stick together and cause blockages in the arteries. ?Take low dose aspirin 81 PLUS clopidogrel/Plavix 75 for 3 weeks  ?After three weeks, take aspirin 81 mg alone from now on ? ?To shrink the fatty plaques, INCREASE your atorvastatin/Lipitor from 40 --> 80 mg daily.   ? ?STOP SMOKING ? ? ?For the abnormal movements: take aripiprazole/Abilify  ?This may be able to be reduced/stopped in the future  ? Increase activity slowly   Complete by: As directed ?  ? ?  ?  ? ?Discharge Exam: ?Filed Weights  ? ?General: Pt is alert, awake, not in acute distress ?Cardiovascular: RRR, nl S1-S2, no murmurs appreciated.   No LE edema.   ?Respiratory: Normal respiratory rate and rhythm.  CTAB without rales or wheezes. ?Abdominal: Abdomen soft and non-tender.  No distension or HSM.   ?Neuro/Psych: Mild subtle left arm weakness. Judgment and insight appear normal. ? ? ?Condition at discharge: good ? ?The results of significant diagnostics from this hospitalization (including imaging, microbiology, ancillary and laboratory) are listed below for reference.  ? ?Imaging Studies: ?CT ANGIO HEAD NECK W WO CM ? ?Result Date: 11/17/2021 ?CLINICAL DATA:  Neuro deficit, acute, stroke suspected Stroke/TIA, determine embolic source EXAM: CT ANGIOGRAPHY HEAD AND NECK TECHNIQUE: Multidetector CT imaging of the head and neck was performed using the standard protocol during bolus administration of intravenous contrast. Multiplanar CT image reconstructions and MIPs were obtained to evaluate the vascular anatomy. Carotid stenosis measurements (when applicable) are obtained utilizing NASCET criteria,  using the distal internal carotid diameter as the denominator. RADIATION DOSE REDUCTION: This exam was performed according to the departmental dose-optimization program which includes automated exposure control, adjustment of the mA and/or kV according to patient size and/or use of iterative reconstruction technique. CONTRAST:  58mL OMNIPAQUE IOHEXOL 350 MG/ML SOLN COMPARISON:  CT head November 16, 2021. MRI November 17, 2021. FINDINGS: CT HEAD FINDINGS Brain: Right posterior MCA territory infarcts better characterized on recent MRI. No evidence of progressive mass effect or acute hemorrhage. Contralateral remote left MCA territory infarct. Severe patchy white matter hypoattenuation is nonspecific but compatible with chronic microvascular ischemic disease. Mild cerebral atrophy. No hydrocephalus, mass lesion, midline shift, or extra-axial fluid collection. Vascular: See below. Skull: No acute fracture. Sinuses: Clear sinuses. Orbits: No acute finding. Review of the MIP images confirms the above findings CTA NECK FINDINGS Aortic arch: Calcific atherosclerosis. Great vessel origins are patent without significant stenosis. Right carotid system: Atherosclerosis at the carotid bifurcation without greater than 50% stenosis. Tortuous ICA. Left carotid system: Atherosclerosis at the carotid bifurcation without greater than 50% stenosis.  Tortuous ICA. Vertebral arteries: Mild stenosis of the proximal right vertebral artery. Both vertebral arteries are patent without evidence of significant (greater than 50%) stenosis. Skeleton: Mild-to-moderate multilevel degenerative change in the cervical spine. Other neck: No evidence of acute abnormality. Upper chest: Calcified granuloma in the left upper lobe. Otherwise, visualized lung apices are clear within limitation of expiratory imaging. Review of the MIP images confirms the above findings CTA HEAD FINDINGS Anterior circulation: Mild to moderate right and mild left atherosclerotic  narrowing of the paraclinoid intracranial ICAs. Moderate to severe stenosis of the proximal right M1 MCA. No proximal right M2 MCA occlusion. Left MCA and bilateral AC is patent with multifocal mild atherosclerotic ir

## 2021-11-19 NOTE — Assessment & Plan Note (Signed)
BMI 40.3 

## 2021-11-19 NOTE — Progress Notes (Signed)
Patient ready to discharge to CIR, awaiting final word for bed number. ?

## 2021-11-19 NOTE — Progress Notes (Signed)
Inpatient Rehabilitation Admissions Coordinator  ? ?I have insurance approval and CIR bed to admit her to today. I have notified patient , acute team and TOC. I will make the arrangements to admit today. ? ?Danne Baxter, RN, MSN ?Rehab Admissions Coordinator ?[phone] ?11/19/2021 8:30 AM ? ?

## 2021-11-19 NOTE — H&P (Signed)
? ? ?Physical Medicine and Rehabilitation Admission H&P ? ?  ?Chief Complaint  ?Patient presents with  ? Tremors  ? Anxiety  ?: ?HPI: Veronica Weber is a 66 year old right-handed female with history of hypertension hyperlipidemia, osteoarthritis bilateral knees, history of vasovagal syncope and tobacco use.  Per chart review patient lives alone independent prior to admission 1 level home 5 steps to entry.  She has multiple friends and family in the area to provide assistance.  By report patient went to bed on Thursday 3/16 and woke up on Friday with choreiform involuntary movements in her left arm and leg.  She was admitted 11/16/2021 with CT/MRI of the brain showing patchy and scattered small acute infarcts in the right MCA territory primarily the posterior right MCA division with some MCA/PCA watershed involvement.  No associated hemorrhage or mass effect.  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  CT angiogram head and neck no large vessel occlusion.  Echocardiogram with ejection fraction of 60 to 65% no wall motion abnormalities grade 1 diastolic dysfunction.  Admission chemistries unremarkable except glucose 102, alcohol negative, urine drug screen negative, urinalysis negative nitrite.  Patient has been placed on aspirin 325 mg daily and Plavix 75 mg day x3 months then aspirin alone.  Subcutaneous Lovenox for DVT prophylaxis.  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  Maintain on a regular consistency diet.  Therapy evaluations completed due to patient decreased functional mobility was admitted for a comprehensive rehab program. ? ?Review of Systems  ?Constitutional:  Negative for chills and fever.  ?HENT:  Negative for hearing loss.   ?Eyes:  Negative for blurred vision and double vision.  ?Respiratory:  Negative for cough and shortness of  breath.   ?Cardiovascular:  Negative for chest pain, palpitations and leg swelling.  ?Gastrointestinal:  Positive for constipation. Negative for heartburn and nausea.  ?Genitourinary:  Negative for dysuria, flank pain and hematuria.  ?Musculoskeletal:  Positive for back pain and myalgias.  ?     Chronic back pain from a fall early March/2023.  ?Skin:  Negative for rash.  ?Neurological:   ?     Involuntary movements left arm and leg  ?All other systems reviewed and are negative. ?Past Medical History:  ?Diagnosis Date  ? Arthritis   ? Cataract   ? Hyperlipidemia   ? Hypertension   ? Thyroid disease   ? Tobacco use   ? ?Past Surgical History:  ?Procedure Laterality Date  ? CATARACT EXTRACTION    ? 1997, 1998  ? [place]  ? CHOLECYSTECTOMY  1998  ? TUBAL LIGATION  1994  ? ?Family History  ?Problem Relation Age of Onset  ? Arthritis Mother   ? Cancer Maternal Grandmother   ? Diabetes Maternal Grandmother   ? Diabetes Maternal Grandfather   ? Healthy Daughter   ? Healthy Son   ? ?Social History:  reports that she has been smoking cigarettes. She started smoking about 45 years ago. She has a 22.50 pack-year smoking history. She has never used smokeless tobacco. She reports that she does not currently use alcohol. She reports that she does not use drugs. ?Allergies: No Known Allergies ?Medications Prior to Admission  ?Medication Sig Dispense Refill  ? aspirin 325 MG tablet Take 325 mg by mouth daily as needed for moderate pain.    ? atorvastatin (LIPITOR)  40 MG tablet Take 1 tablet (40 mg total) by mouth daily. 90 tablet 3  ? chlorthalidone (HYGROTON) 25 MG tablet Take 1 tablet (25 mg total) by mouth daily. 90 tablet 3  ? ibuprofen (ADVIL) 200 MG tablet Take 200 mg by mouth every 6 (six) hours as needed for moderate pain or mild pain.    ? losartan (COZAAR) 50 MG tablet Take 1 tablet (50 mg total) by mouth daily. 90 tablet 3  ? varenicline (CHANTIX CONTINUING MONTH PAK) 1 MG tablet Take 1 tablet (1 mg total)  by mouth 2 (two) times daily. (Patient not taking: Reported on 09/04/2021) 60 tablet 1  ? varenicline (CHANTIX PAK) 0.5 MG X 11 & 1 MG X 42 tablet Take one 0.5 mg tablet by mouth once daily for 3 days, then increase to one 0.5 mg tablet twice daily for 4 days, then increase to one 1 mg tablet twice daily. (Patient not taking: Reported on 09/04/2021) 53 tablet 0  ? ? ? ? ?Home: ?Home Living ?Family/patient expects to be discharged to:: Private residence ?Living Arrangements:  (daughter in [place] and son in [place]) ?Available Help at Discharge: Family, Available PRN/intermittently ?Type of Home: House ?Home Access: Stairs to enter ?Entrance Stairs-Number of Steps: 5 ?Entrance Stairs-Rails: Right, Left, Can reach both ?Home Layout: One level ?Bathroom Shower/Tub: Walk-in shower ?Bathroom Toilet: Standard ?Bathroom Accessibility: Yes ?Home Equipment: Cane - quad ? Lives With: Alone ?  ?Functional History: ?Prior Function ?Prior Level of Function : Independent/Modified Independent, Driving ?Mobility Comments: uses cane sometimes when bil knee arthritis hurting ?ADLs Comments: independent ? ?Functional Status:  ?Mobility: ?Bed Mobility ?Overal bed mobility: Modified Independent ?General bed mobility comments: seated in recliner ?Transfers ?Overall transfer level: Needs assistance ?Equipment used: Rolling walker (2 wheels) ?Transfers: Sit to/from Stand, Bed to chair/wheelchair/BSC ?Sit to Stand: Min guard ?Bed to/from chair/wheelchair/BSC transfer type:: Step pivot ?Step pivot transfers: Min guard ?General transfer comment: frequent cues for safety with RW ?Ambulation/Gait ?Ambulation/Gait assistance: Min guard ?Gait Distance (Feet): 250 Feet ?Assistive device: None ?Gait Pattern/deviations: Step-through pattern, Decreased stride length (incr lateral sway) ?General Gait Details: no difficulty placing LLE; incr lateral sway per her baseline; when attempted RW, pt lifting RW (?due to impulsivity vs sporadic LUE movements), did well  without device ?Gait velocity: decr ?Gait velocity interpretation: 1.31 - 2.62 ft/sec, indicative of limited community ambulator ?  ? ?ADL: ?ADL ?Overall ADL's : Needs assistance/impaired ?Eating/Feeding: Minimal assistance, Sitting ?Grooming: Minimal assistance, Sitting, Wash/dry hands, Wash/dry face, Brushing hair ?Grooming Details (indicate cue type and reason): able to wash hands and face standing at sink with min guard assist for safety. Brushed hair seated in recliner using LUE with min assist ?Upper Body Bathing: Minimal assistance, Sitting ?Lower Body Bathing: Moderate assistance, Sitting/lateral leans, Sit to/from stand ?Upper Body Dressing : Minimal assistance, Sitting ?Lower Body Dressing: Minimal assistance, Sitting/lateral leans ?Lower Body Dressing Details (indicate cue type and reason): changed socks ?Toilet Transfer: Minimal assistance, Ambulation ?Toilet Transfer Details (indicate cue type and reason): requires frequent cues for safety, correct walker use, and for hand placement ?Toileting- Clothing Manipulation and Hygiene: Moderate assistance, Sitting/lateral lean, Sit to/from stand ?Functional mobility during ADLs: Minimal assistance, Rolling walker (2 wheels) ?General ADL Comments: Increased assist needed due to chorea movements in LUE and LLE ? ?Cognition: ?Cognition ?Overall Cognitive Status: Within Functional Limits for tasks assessed ?Orientation Level: Oriented X4 ?Year: 2023 ?Month: March ?Day of Week: Correct ?Attention: Focused, Sustained ?Focused Attention: Appears intact ?Sustained Attention: Appears intact ?Memory: Appears  intact ?Problem Solving: Appears intact ?Executive Function: Reasoning ?Reasoning: Appears intact ?Cognition ?Arousal/Alertness: Awake/alert ?Behavior During Therapy: Anxious, Restless ?Overall Cognitive Status: Within Functional Limits for tasks assessed ?General Comments: requires frequent cues for safety and correct use of RW ? ?Physical Exam: ?Blood pressure (!)  135/58, pulse 75, temperature 98.2 ?F (36.8 ?C), temperature source Oral, resp. rate 20, SpO2 93 %. ?Physical Exam ?Vitals and nursing note reviewed. Exam conducted with a chaperone present.  ?Constitution

## 2021-11-19 NOTE — Progress Notes (Signed)
Inpatient Rehabilitation Admission Medication Review by a Pharmacist ? ?A complete drug regimen review was completed for this patient to identify any potential clinically significant medication issues. ? ?High Risk Drug Classes Is patient taking? Indication by Medication  ?Antipsychotic Yes Abilify for mood, agitation  ?Anticoagulant Yes Lovenox for VTE ppx  ?Antibiotic No   ?Opioid No   ?Antiplatelet Yes Aspirin / Plavix for CVA x 3 months then ASA alone  ?Hypoglycemics/insulin No   ?Vasoactive Medication No   ?Chemotherapy No   ?Other Yes Lipitor for HLD  ? ? ? ?Type of Medication Issue Identified Description of Issue Recommendation(s)  ?Drug Interaction(s) (clinically significant) ?    ?Duplicate Therapy ?    ?Allergy ?    ?No Medication Administration End Date ?    ?Incorrect Dose ?    ?Additional Drug Therapy Needed ?    ?Significant med changes from prior encounter (inform family/care partners about these prior to discharge).    ?Other ?    ? ? ?Clinically significant medication issues were identified that warrant physician communication and completion of prescribed/recommended actions by midnight of the next day:  No ? ? ?Pharmacist comments: Resume losartan if and when appropriate ? ?Time spent performing this drug regimen review (minutes):  20 minutes ? ? ?Tad Moore ?11/19/2021 2:18 PM ?

## 2021-11-19 NOTE — Progress Notes (Signed)
Courtney Heys, MD  ?Physician ?Physical Medicine and Rehabilitation ?PMR Pre-admission    ?Signed ?Date of Service:  11/18/2021  2:23 PM ? Related encounter: ED to Hosp-Admission (Discharged) from 11/16/2021 in [place] [facility] ?  ?Signed    ?  ?Show:Clear all ?[x] Written[x] Templated[x] Copied ? ?Added by: ?[x] Avalina Benko, Vertis Kelch, RN[x] Courtney Heys, MD ? ?[] Hover for details ?   ?   ?   ?   ?   ?   ?   ?   ?   ?   ?   ?   ?   ?   ?   ?   ?   ?   ?   ?   ?   ?   ?   ?   ?   ?   ?   ?   ?   ?   ?   ?   ?   ?   ?   ?   ?   ?   ?   ?   ?   ?   ?   ?   ?   ?   ?   ?   ?   ?   ?   ?   ?   ?   ?   ?   ?   ?   ?   ?   ?   ?   ?   ?   ?   ?   ?   ?   ?   ?   ?   ?   ?   ?   ?   ?   ?   ?   ?   ?   ?   ?   ?   ?   ?   ?   ?   ?   ?   ?   ?   ?   ?   ?   ?   ?   ?   ?   ?   ?   ?   ?   ?   ?   ?   ?   ?   ?   ?   ?   ?   ?   ?   ?   ?   ?   ?   ?   ?   ?   ?   ?   ?   ?   ?   ?   ?   ?   ?   ?   ?   ?   ?   ?   ?   ?   ?   ?   ?   ?   ?PMR Admission Coordinator Pre-Admission Assessment ?  ?Patient: Veronica Weber is an 66 y.o., female ?MRN: [id number] ?DOB: [birth year] ?Height:   ?Weight:   ?  ?Insurance Information ?HMO: yes    PPO:      PCP:      IPA:      80/20:      OTHER:  ?PRIMARY: [employer] Medicare      Policy#: [id number]      Subscriber: pt ?CM Name: Ubaldo Glassing      Phone#: [phone] option #7     Fax#: [phone] ?Pre-Cert#: [id number]   approved for 7 days    Employer:  ?Benefits:  Phone #: [phone]  Name: 3/20 ?Eff. Date: 09/01/21     Deduct: none      Out of Pocket Max: $3600      Life Max: none ?CIR: $295 co pay per day days 1 until 5      SNF: no copay days 1 until 20; $196 co pay per day days 21 until 39; no copay days 40 until 100 ?Outpatient: $20 per visit     Co-Pay: visits per medical neccesity ?Home Health: 100%      Co-Pay: visits per medical neccesity ?DME: 80%     Co-Pay: 20% ?Providers: in network ? ?SECONDARY: none ?  ?Financial Counselor:       Phone#:  ?  ?The  ?Data Collection Information Summary? for patients in Inpatient Rehabilitation Facilities with attached ?Privacy Act Gerty Records? was provided and verbally reviewed with: Patient ?  ?Emergency Contact Information ?Contact Information   ?  ?  Name Relation Home Work Mobile  ?  [facility] Mother [phone]      ?  Amini,Parisa Daughter     [phone]  ?  ?   ?  ?Current Medical History  ?Patient Admitting Diagnosis: CVA ?  ?History of Present Illness:  66 year old right-handed female with history of hypertension hyperlipidemia, osteoarthritis bilateral knees, history of vasovagal syncope and tobacco use.   By report patient went to bed on Thursday 3/16 and woke up on Friday with choreiform involuntary movements in her left arm and leg.  She was admitted 11/16/2021 with CT/MRI of the brain showing patchy and scattered small acute infarcts in the right MCA territory primarily the posterior right MCA division with some MCA/PCA watershed involvement.  No associated hemorrhage or mass effect.  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  CT angiogram head and neck no large vessel occlusion.  Echocardiogram with ejection fraction of 60 to 65% no wall motion abnormalities grade 1 diastolic dysfunction.  Admission chemistries unremarkable except glucose 102, alcohol negative, urine drug screen negative, urinalysis negative nitrite.  Patient has been placed on aspirin 325 mg daily and Plavix 75 mg day x3 months then aspirin alone.  Subcutaneous Lovenox for DVT prophylaxis.  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  Maintain on a regular consistency diet.   ?  ?Complete NIHSS TOTAL: 0 ?  ?Patient's medical record from [facility]  has been reviewed by the rehabilitation admission coordinator and physician. ?  ?Past Medical History  ?    ?Past  Medical History:  ?Diagnosis Date  ? Arthritis    ? Cataract    ? Hyperlipidemia    ? Hypertension    ? Thyroid disease    ? Tobacco use    ?  ?Has the patient had major surgery during 100 days prior to admission? No ?  ?Family History   ?family history includes Arthritis in her mother; Cancer in her maternal grandmother; Diabetes in her maternal grandfather and maternal grandmother; Healthy in her daughter and son. ?  ?Current Medications ?  ?Current Facility-Administered Medications:  ?  acetaminophen (TYLENOL) tablet 650 mg, 650 mg, Oral, Q4H PRN, 650 mg at 11/18/21 1203 **OR** acetaminophen (TYLENOL) 160 MG/5ML solution 650 mg, 650 mg, Per Tube, Q4H PRN **OR** acetaminophen (TYLENOL) suppository 650 mg, 650 mg, Rectal, Q4H PRN, Posey Pronto, Vishal R, MD ?  albuterol (PROVENTIL) (2.5 MG/3ML) 0.083% nebulizer solution 3 mL,  3 mL, Inhalation, Q6H PRN, Zada Finders R, MD ?  ARIPiprazole (ABILIFY) tablet 5 mg, 5 mg, Oral, Daily, Merrily Brittle, DO, 5 mg at 11/19/21 [id number] ?  aspirin EC tablet 325 mg, 325 mg, Oral, Daily, Rosalin Hawking, MD, 325 mg at 11/19/21 [id number] ?  atorvastatin (LIPITOR) tablet 80 mg, 80 mg, Oral, Daily, Rosalin Hawking, MD, 80 mg at 11/19/21 [id number] ?  clopidogrel (PLAVIX) tablet 75 mg, 75 mg, Oral, Daily, Merrily Brittle, DO, 75 mg at 11/19/21 [id number] ?  enoxaparin (LOVENOX) injection 40 mg, 40 mg, Subcutaneous, Q24H, Zada Finders R, MD, 40 mg at 11/18/21 2003 ?  lactated ringers infusion, , Intravenous, Continuous, Kayleen Memos, DO, Last Rate: 50 mL/hr at 11/18/21 1343, New Bag at 11/18/21 1343 ?  nicotine (NICODERM CQ - dosed in mg/24 hours) patch 21 mg, 21 mg, Transdermal, Daily, Zada Finders R, MD, 21 mg at 11/19/21 [id number] ?  senna-docusate (Senokot-S) tablet 1 tablet, 1 tablet, Oral, QHS PRN, Lenore Cordia, MD ?  ?Patients Current Diet:  ?Diet Order   ?  ?         ?    Diet Heart Room service appropriate? Yes; Fluid consistency: Thin  Diet effective now       ?  ?  ?   ?  ?  ?   ?  ?Precautions /  Restrictions ?Precautions ?Precautions: Fall ?Restrictions ?Weight Bearing Restrictions: No  ?  ?Has the patient had 2 or more falls or a fall with injury in the past year? No ?  ?Prior Activity Level ?Community (5-7x/wk): Independent, driving, works as Scientist, water quality at [employer] ?  ?Prior Functional Level ?Self Care: Did the patient need help bathing, dressing, using the toilet or eating? Independent ?  ?Indoor Mobility: Did the patient need assistance with walking from room to room (with or without device)? Independent ?  ?Stairs: Did the patient need assistance with internal or external stairs (with or without device)? Independent ?  ?Functional Cognition: Did the patient need help planning regular tasks such as shopping or remembering to take medications? Independent ?  ?Patient Information ?Are you of Hispanic, Latino/a,or Spanish origin?: A. No, not of Hispanic, Latino/a, or Spanish origin ?What is your race?: A. White ?Do you need or want an interpreter to communicate with a doctor or health care staff?: 0. No ?  ?Patient's Response To:  ?Health Literacy and Transportation ?Is the patient able to respond to health literacy and transportation needs?: Yes ?Health Literacy - How often do you need to have someone help you when you read instructions, pamphlets, or other written material from your doctor or pharmacy?: Never ?In the past 12 months, has lack of transportation kept you from medical appointments or from getting medications?: No ?In the past 12 months, has lack of transportation kept you from meetings, work, or from getting things needed for daily living?: No ?  ?Home Assistive Devices / Equipment ?Home Assistive Devices/Equipment: Cane (specify quad or straight) ?Home Equipment: Cane - quad ?  ?Prior Device Use: Indicate devices/aids used by the patient prior to current illness, exacerbation or injury?  Cane occasionally ?  ?Current Functional Level ?Cognition ?  Overall Cognitive Status: Within  Functional Limits for tasks assessed ?Orientation Level: Oriented X4 ?General Comments: requires frequent cues for safety and correct use of RW ?Attention: Focused, Sustained ?Focused Attention: Appears intact ?Sustained Atte

## 2021-11-20 LAB — COMPREHENSIVE METABOLIC PANEL
ALT: 31 U/L (ref 0–44)
AST: 35 U/L (ref 15–41)
Albumin: 3.5 g/dL (ref 3.5–5.0)
Alkaline Phosphatase: 86 U/L (ref 38–126)
Anion gap: 6 (ref 5–15)
BUN: 12 mg/dL (ref 8–23)
CO2: 27 mmol/L (ref 22–32)
Calcium: 9.7 mg/dL (ref 8.9–10.3)
Chloride: 103 mmol/L (ref 98–111)
Creatinine, Ser: 0.77 mg/dL (ref 0.44–1.00)
GFR, Estimated: >60 mL/min (ref >60)
Glucose, Bld: 100 mg/dL — ABNORMAL HIGH (ref 70–99)
Potassium: 3.8 mmol/L (ref 3.5–5.1)
Sodium: 136 mmol/L (ref 135–145)
Total Bilirubin: 0.7 mg/dL (ref 0.3–1.2)
Total Protein: 6.1 g/dL — ABNORMAL LOW (ref 6.5–8.1)

## 2021-11-20 LAB — CBC WITH DIFFERENTIAL/PLATELET
Abs Immature Granulocytes: 0.02 10*3/uL (ref 0.00–0.07)
Basophils Absolute: 0 10*3/uL (ref 0.0–0.1)
Basophils Relative: 1 %
Eosinophils Absolute: 0.2 10*3/uL (ref 0.0–0.5)
Eosinophils Relative: 4 %
HCT: 39.8 % (ref 36.0–46.0)
Hemoglobin: 13.3 g/dL (ref 12.0–15.0)
Immature Granulocytes: 0 %
Lymphocytes Relative: 25 %
Lymphs Abs: 1.5 10*3/uL (ref 0.7–4.0)
MCH: 31.1 pg (ref 26.0–34.0)
MCHC: 33.4 g/dL (ref 30.0–36.0)
MCV: 93.2 fL (ref 80.0–100.0)
Monocytes Absolute: 0.5 10*3/uL (ref 0.1–1.0)
Monocytes Relative: 9 %
Neutro Abs: 3.7 10*3/uL (ref 1.7–7.7)
Neutrophils Relative %: 61 %
Platelets: 270 10*3/uL (ref 150–400)
RBC: 4.27 MIL/uL (ref 3.87–5.11)
RDW: 12.2 % (ref 11.5–15.5)
WBC: 6 10*3/uL (ref 4.0–10.5)
nRBC: 0 % (ref 0.0–0.2)

## 2021-11-20 MED ORDER — LIDOCAINE 5 % EX PTCH
1.0000 | MEDICATED_PATCH | CUTANEOUS | Status: DC
  Administered 2021-11-20 – 2021-11-22 (×3): 1 via TRANSDERMAL
  Filled 2021-11-20 (×3): qty 1

## 2021-11-20 MED ORDER — MAGNESIUM GLUCONATE 500 MG PO TABS
250.0000 mg | ORAL_TABLET | Freq: Every day | ORAL | Status: DC
  Administered 2021-11-20 – 2021-11-22 (×3): 250 mg via ORAL
  Filled 2021-11-20 (×3): qty 1

## 2021-11-20 NOTE — Progress Notes (Signed)
Inpatient Rehabilitation Care Coordinator ?Assessment and Plan ?Patient Details  ?Name: Veronica Weber ?MRN: [id number] ?Date of Birth: Jul 06, 1956 ? ?Today's Date: 11/20/2021 ? ?Hospital Problems: Principal Problem: ?  Right middle cerebral artery stroke ([place]) ? ?Past Medical History:  ?Past Medical History:  ?Diagnosis Date  ? Arthritis   ? Cataract   ? Hyperlipidemia   ? Hypertension   ? Thyroid disease   ? Tobacco use   ? ?Past Surgical History:  ?Past Surgical History:  ?Procedure Laterality Date  ? CATARACT EXTRACTION    ? 1997, 1998  ? [place]  ? CHOLECYSTECTOMY  1998  ? TUBAL LIGATION  1994  ? ?Social History:  reports that she has been smoking cigarettes. She started smoking about 45 years ago. She has a 22.50 pack-year smoking history. She has never used smokeless tobacco. She reports that she does not currently use alcohol. She reports that she does not use drugs. ? ?Family / Support Systems ?Marital Status: Divorced ?Patient Roles: Parent ?Children: Parisa-daughter [phone] [place] coming Friday 3/24 to stay a few weeks with  Son in [place] ?Other Supports: Eppie-mom [phone] both parents are living [address] & [address] ?Anticipated Caregiver: Self and [facility] [employer] will assist if needed. Pt is fairly high level ?Ability/Limitations of Caregiver: Parents also have two developly disabled son's at home that need supervision ?Caregiver Availability: Other (Comment) (daughter will be here Friday 3/24) ?Family Dynamics: Close knit family pt checks on parents and still works part time. Pt is active and close with her family members ? ?Social History ?Preferred language: English ?Religion: Non-Denominational ?Cultural Background: No issues ?Education: HS ?Health Literacy - How often do you need to have someone help you when you read instructions, pamphlets, or other written material from your doctor or pharmacy?: Never ?Writes: Yes ?Employment Status: Employed ?Name of Employer: [employer]-part time ?Return  to Work Plans: Hopefully will return once recovered ?Legal History/Current Legal Issues: no issues ?Guardian/Conservator: None-according to MD pt is capable of making her own decisions while here  ? ?Abuse/Neglect ?Abuse/Neglect Assessment Can Be Completed: Yes ?Physical Abuse: Denies ?Verbal Abuse: Denies ?Sexual Abuse: Denies ?Exploitation of patient/patient's resources: Denies ?Self-Neglect: Denies ? ?Patient response to: ?Social Isolation - How often do you feel lonely or isolated from those around you?: Never ? ?Emotional Status ?Pt's affect, behavior and adjustment status: Pt is motivated to do well and recover from this stroke, she is one who has always been independent and taken care of herself and others. She is not one to ask for assist and this is hard for her. She should do well here and be here a short time ?Recent Psychosocial Issues: other health issues were being managed prior to admission ?Psychiatric History: History of anxiety and takes medications for which helps. She takes each day at a time and does her best. She feels nothing is perfect and has a good attitude toward life ?Substance Abuse History: Tobacco plans to quit since aware of the health issues it can cause. ? ?Patient / Family Perceptions, Expectations & Goals ?Pt/Family understanding of illness & functional limitations: Pt can explain her stroke and deficits, she does talk with the MD daily and feels she is aware of her treatment plan moving forward and ready to do rehab. She is hopeful it will be shrot length of stay here. ?Premorbid pt/family roles/activities: Mom, employee, daughter, friend, sibling, etc ?Anticipated changes in roles/activities/participation: resume ?Pt/family expectations/goals: Pt states: " I am hopeful I can get back to independent level again,  I feel like I can." ? ?Community Resources ?Community Agencies: None ?Premorbid Home Care/DME Agencies: None ?Transportation available at discharge: Self Mom drives and  daughter will be here this Friday ?Is the patient able to respond to transportation needs?: Yes ?In the past 12 months, has lack of transportation kept you from medical appointments or from getting medications?: No ?In the past 12 months, has lack of transportation kept you from meetings, work, or from getting things needed for daily living?: No ? ?Discharge Planning ?Living Arrangements: Alone ?Support Systems: Children, Parent, Other relatives, Friends/neighbors ?Type of Residence: Private residence ?Insurance Resources: Multimedia programmer (specify) Primary school teacher) ?Financial Resources: Employment, Social Security ?Financial Screen Referred: No ?Living Expenses: Own ?Money Management: Patient ?Does the patient have any problems obtaining your medications?: No ?Home Management: self ?Patient/Family Preliminary Plans: Return home with her daughter who will be here for a few weeks to assist with her transition home. Should be short length of stay here due to high level. Await therapy evaluations ?Care Coordinator Barriers to Discharge: Insurance for SNF coverage ?Care Coordinator Anticipated Follow Up Needs: HH/OP ? ?Clinical Impression ?Pleasant very motivated pt who is high level and will be here a short time. Her daughter is coming form [place] on Friday to stay for a few weeks. Pt has a good attitude and is coping appropriately. Will await therapy evaluations and work on discharge needs.  ? ?Elease Hashimoto ?11/20/2021, 9:25 AM ? ?  ?

## 2021-11-20 NOTE — Progress Notes (Signed)
[facility] ?Individual Statement of Services ? ?Patient Name:  Veronica Weber  ?Date:  11/20/2021 ? ?Welcome to the [facility].  Our goal is to provide you with an individualized program based on your diagnosis and situation, designed to meet your specific needs.  With this comprehensive rehabilitation program, you will be expected to participate in at least 3 hours of rehabilitation therapies Monday-Friday, with modified therapy programming on the weekends. ? ?Your rehabilitation program will include the following services:  Physical Therapy (PT), Occupational Therapy (OT), 24 hour per day rehabilitation nursing, Therapeutic Recreaction (TR), Care Coordinator, Rehabilitation Medicine, Nutrition Services, and Pharmacy Services ? ?Weekly team conferences will be held on wednesday to discuss your progress.  Your Inpatient Rehabilitation Care Coordinator will talk with you frequently to get your input and to update you on team discussions.  Team conferences with you and your family in attendance may also be held. ? ?Expected length of stay: 5-7 days   Overall anticipated outcome: supervision-mod/I level ? ?Depending on your progress and recovery, your program may change. Your Inpatient Rehabilitation Care Coordinator will coordinate services and will keep you informed of any changes. Your Inpatient Rehabilitation Care Coordinator's name and contact numbers are listed  below. ? ?The following services may also be recommended but are not provided by the Inpatient [facility]:  ?Driving Evaluations ?Home Health Rehabiltiation Services ?Outpatient Rehabilitation Services ?Vocational Rehabilitation ?  ?Arrangements will be made to provide these services after discharge if needed.  Arrangements include referral to agencies that provide these services. ? ?Your insurance has been verified to be:  [employer]-Medicare ?Your primary doctor is:  Alyssa Allwardt ? ?Pertinent  information will be shared with your doctor and your insurance company. ? ?Inpatient Rehabilitation Care Coordinator:  Dossie Der, LCSW [phone] or [phone] ? ?Information discussed with and copy given to patient by: Lucy Chris, 11/20/2021, 9:56 AM    ?

## 2021-11-20 NOTE — Evaluation (Addendum)
Occupational Therapy Assessment and Plan ? ?Patient Details  ?Name: Veronica Weber ?MRN: [id number] ?Date of Birth: [birth year] ? ?OT Diagnosis: hemiplegia affecting dominant side; impaired coordination ?Rehab Potential: Rehab Potential (ACUTE ONLY): Excellent ?ELOS: 5-7 days  ? ?Today's Date: 11/20/2021 ?OT Individual Time:   730-845 ?    OT Individual Time Calculation (min): 75 min  ? ?Hospital Problem: Principal Problem: ?  Right middle cerebral artery stroke (HCC) ? ? ?Past Medical History:  ?Past Medical History:  ?Diagnosis Date  ? Arthritis   ? Cataract   ? Hyperlipidemia   ? Hypertension   ? Thyroid disease   ? Tobacco use   ? ?Past Surgical History:  ?Past Surgical History:  ?Procedure Laterality Date  ? CATARACT EXTRACTION    ? 1997, 1998  ? CESAREAN SECTION  1983  ? CHOLECYSTECTOMY  1998  ? TUBAL LIGATION  1994  ? ? ?Assessment & Plan ?Clinical Impression:  Veronica Weber is a 66 year old right-handed female with history of hypertension hyperlipidemia, osteoarthritis bilateral knees, history of vasovagal syncope and tobacco use.  Per chart review patient lives alone independent prior to admission 1 level home 5 steps to entry.  She has multiple friends and family in the area to provide assistance.  By report patient went to bed on Thursday 3/16 and woke up on Friday with choreiform involuntary movements in her left arm and leg.  She was admitted 11/16/2021 with CT/MRI of the brain showing patchy and scattered small acute infarcts in the right MCA territory primarily the posterior right MCA division with some MCA/PCA watershed involvement.  No associated hemorrhage or mass effect.  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  CT angiogram head and neck no large vessel occlusion.  Echocardiogram with ejection fraction of 60 to 65% no wall motion abnormalities grade 1 diastolic dysfunction.  Admission chemistries unremarkable  except glucose 102, alcohol negative, urine drug screen negative, urinalysis negative nitrite.  Patient has been placed on aspirin 325 mg daily and Plavix 75 mg day x3 months then aspirin alone.  Subcutaneous Lovenox for DVT prophylaxis.  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  Maintain on a regular consistency diet. Patient transferred to CIR on 11/19/2021 .   ? ?Patient currently requires  CGA  with basic self-care skills secondary to muscle weakness, decreased coordination and decreased motor planning, decreased safety awareness, and decreased sitting balance, decreased standing balance, decreased postural control, hemiplegia, and decreased balance strategies.  Prior to hospitalization, patient could complete Adls and IADLs with independent . ? ?Patient will benefit from skilled intervention to increase independence with basic self-care skills prior to discharge home with care partner.  Anticipate patient will require intermittent supervision and follow up home health. ? ?OT - End of Session ?Activity Tolerance: Tolerates 30+ min activity without fatigue ?Endurance Deficit: No ?OT Assessment ?Rehab Potential (ACUTE ONLY): Excellent ?OT Barriers to Discharge: Lack of/limited family support ?OT Barriers to Discharge Comments: No assist available until this friday when dtr arrives from [place] ?OT Patient demonstrates impairments in the following area(s): Balance;Behavior;Motor;Safety ?OT Basic ADL's Functional Problem(s): Grooming;Bathing;Dressing;Toileting ?OT Advanced ADL's Functional Problem(s): Simple Meal Preparation ?OT Transfers Functional Problem(s): Toilet;Tub/Shower ?OT Plan ?OT Intensity: Minimum of 1-2 x/day, 45 to 90 minutes ?OT Frequency: 5 out of 7 days ?OT Duration/Estimated Length of Stay: 5-7 days ?OT Treatment/Interventions: Balance/vestibular training;Patient/family education;DME/adaptive equipment  instruction;Therapeutic Activities;Psychosocial support;Cognitive remediation/compensation;Therapeutic Exercise;Discharge planning;Functional mobility training;Self Care/advanced ADL retraining;UE/LE Strength taining/ROM;Neuromuscular re-education;Disease mangement/prevention;UE/LE Coordination activities;Community reintegration ?OT Basic Self-Care Anticipated Outcome(s): mod I- sup ?OT Toileting Anticipated Outcome(s): mod I ?OT Bathroom Transfers Anticipated Outcome(s): mod I ?OT Recommendation ?Patient destination: Home ?Follow Up Recommendations: Home health OT;Outpatient OT ?Equipment Recommended: To be determined ?Equipment Details: has a quad cane ? ? ?OT Evaluation ?Precautions/Restrictions  ?Restrictions ?Weight Bearing Restrictions: No ?Pain ?Pain Assessment ?Pain Scale: 0-10 ?Pain Score: 0-No pain ?Home Living/Prior Functioning ?Home Living ?Family/patient expects to be discharged to:: Private residence ?Living Arrangements: Alone ?Available Help at Discharge: Family, Available PRN/intermittently (daughter coming from [place] to help temporarily for about a month) ?Type of Home: House ?Home Access: Stairs to enter ?Entrance Stairs-Number of Steps: 5 ?Entrance Stairs-Rails: Right, Left, Can reach both ?Home Layout: One level ?Bathroom Shower/Tub: Psychologist, counselling, Tub/shower unit ?Bathroom Toilet: Standard ?Bathroom Accessibility: Yes ? 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 Function ?Level of Independence: Independent with basic ADLs, Independent with transfers, Independent with gait, Independent with homemaking with ambulation ?Vision ?Baseline Vision/History: 1 Wears  glasses ?Ability to See in Adequate Light: 0 Adequate ?Patient Visual Report: No change from baseline ?Vision Assessment?: No apparent visual deficits ?Perception  ?Perception: Within Functional LimitsPerception: WFL ?Praxis ?Praxis: Impaired ?Praxis Impairment Details: Motor planning ?Praxis-Other Comments: chorea LUE mild ?Cognition ?Cognition ?Overall Cognitive Status: Within Functional Limits for tasks assessed (Pt very talkative and slighlty tangential however possibly this is her baseline.) ?Arousal/Alertness: Awake/alert ?Orientation Level: Person;Place;Situation ?Person: Oriented ?Place: Oriented ?Situation: Oriented ?Memory: Appears intact ?Attention: Focused;Sustained ?Focused Attention: Appears intact ?Sustained Attention: Appears intact ?Awareness: Appears intact ?Problem Solving: Appears intact ?Behaviors: Restless (possibly baseline; mild) ?Safety/Judgment: Appears intact ?Brief Interview for Mental Status (BIMS) ?Repetition of Three Words (First Attempt): 3 ?Temporal Orientation: Year: Correct ?Temporal Orientation: Month: Accurate within 5 days ?Temporal Orientation: Day: Correct ?Recall: "Sock": Yes, no cue required ?Recall: "Blue": Yes, no cue required ?Recall: "Bed": Yes, no cue required ?BIMS Summary Score: 15 ?Sensation ?Sensation ?Light Touch: Appears Intact ?Hot/Cold: Appears Intact ?Proprioception: Impaired Detail ?Proprioception Impaired Details: Impaired LUE ?Stereognosis: Appears Intact ?Coordination ?Gross Motor Movements are Fluid and Coordinated: No ?Fine Motor Movements are Fluid and Coordinated: No ?Finger Nose Finger Test: intact BUE ?Heel Shin Test: slowed on L ?Motor  ?Motor ?Motor: Hemiplegia ?Motor - Skilled Clinical Observations: mild LLE; LUE WFL  ?Trunk/Postural Assessment  ?Cervical Assessment ?Cervical Assessment: Within Functional Limits ?Thoracic Assessment ?Thoracic Assessment: Within Functional Limits ?Lumbar Assessment ?Lumbar Assessment: Within Functional  Limits ?Postural Control ?Postural Control: Within Functional Limits ?Protective Responses: slighlty delayed  ?Balance ?Balance ?Balance Assessed: Yes ?Standardized Balance Assessment ?Standardized Balance Assessment: Berg Balance Test;Timed

## 2021-11-20 NOTE — Patient Care Conference (Signed)
Inpatient RehabilitationTeam Conference and Plan of Care Update ?Date: 11/20/2021   Time: 11:06 AM  ? ? ?Patient Name: Veronica Weber      ?Medical Record Number: [id number]  ?Date of Birth: 1956/04/10 ?Sex: Female         ?Room/Bed: 5C08C/5C08C-01 ?Payor Info: Payor: [employer] / Plan: [facility] MEDICARE / Product Type: *No Product type* /   ? ?Admit Date/Time:  11/19/2021  1:47 PM ? ?Primary Diagnosis:  Right middle cerebral artery stroke (HCC) ? ?Hospital Problems: Principal Problem: ?  Right middle cerebral artery stroke (HCC) ? ? ? ?Expected Discharge Date: Expected Discharge Date: 11/23/21 ? ?Team Members Present: ?Physician leading conference: Dr. Sula Soda ?Social Worker Present: Dossie Der, LCSW ?Nurse Present: Chana Bode, RN ?PT Present: Wynelle Link, PT ?OT Present: Dolphus Jenny, OT ?SLP Present: Eilene Ghazi, SLP ?PPS Coordinator present : Fae Pippin, SLP ? ?   Current Status/Progress Goal Weekly Team Focus  ?Bowel/Bladder ? ?   continent        ?Swallow/Nutrition/ Hydration ? ?           ?ADL's ? ? CGA overall  supervision-mod I  IE, pt education, safety awareness, functional transfers, self care   ?Mobility ? ? indep bed mobility, CGA transfers, CGA ambulation 175ft wtih no AD. CGA 12 steps with 2 rails  supervision  Safety awareness, gait training with LRAD, dynamic standing balance   ?Communication ? ?           ?Safety/Cognition/ Behavioral Observations ?           ?Pain ? ?   N/A        ?Skin ? ?   Burn on left arm from PTA        ? ? ?Discharge Planning:  ?Home alone-daughter flying form [place] Friday 3/24 to stay for a few weeks to make sure transition is smooth home   ?Team Discussion: ?Patient is impulsive, hyper-verbose with poor carry over and a weird gait pattern due to bad knees which is worse with a walker. ?Patient on target to meet rehab goals: ?yes, currently CGA - supervision overall  ? ?*See Care Plan and progress notes for long and short-term goals.   ? ?Revisions to Treatment Plan:  ?N/A ?Ortho appointment for knees 11/25/21 ?  ?Teaching Needs: ?Safety, transfers, medications, dietary modifications, etc.  ?Current Barriers to Discharge: ?Decreased caregiver support and Home enviroment access/layout ? ?Possible Resolutions to Barriers: ?DME: slide board ?  ? ? Medical Summary ?Current Status: poor coordination and carryover, bilateral knee pain L>R, poor dental health, obesity, MCA stroke ? Barriers to Discharge: Medical stability;Weight ? Barriers to Discharge Comments: poor coordination and carryover, bilateral knee pain L>R, poor dental health, obesity, MCA stroke ?Possible Resolutions to [employer]: start lidocaine patch for left knee, provide dietary education, advised regarding risk of stroke post-surgery, delaying planned surgery, continue lipitor ? ? ?Continued Need for Acute Rehabilitation Level of Care: The patient requires daily medical management by a physician with specialized training in physical medicine and rehabilitation for the following reasons: ?Direction of a multidisciplinary physical rehabilitation program to maximize functional independence : Yes ?Medical management of patient stability for increased activity during participation in an intensive rehabilitation regime.: Yes ?Analysis of laboratory values and/or radiology reports with any subsequent need for medication adjustment and/or medical intervention. : Yes ? ? ?I attest that I was present, lead the team conference, and concur with the assessment and plan of the team. ? ? ?Lambert Mody,  Binnie Rail ?11/20/2021, 5:52 PM  ? ? ? ? ? ? ?

## 2021-11-20 NOTE — Evaluation (Incomplete Revision)
Occupational Therapy Assessment and Plan ? ?Patient Details  ?Name: MARKEITA ALICIA ?MRN: [id number] ?Date of Birth: 1956-04-02 ? ?OT Diagnosis: hemiplegia affecting dominant side; impaired coordination ?Rehab Potential: Rehab Potential (ACUTE ONLY): Excellent ?ELOS: 5-7 days  ? ?Today's Date: 11/20/2021 ?OT Individual Time:   730-845 ?     ? ?Hospital Problem: Principal Problem: ?  Right middle cerebral artery stroke (HCC) ? ? ?Past Medical History:  ?Past Medical History:  ?Diagnosis Date  ? Arthritis   ? Cataract   ? Hyperlipidemia   ? Hypertension   ? Thyroid disease   ? Tobacco use   ? ?Past Surgical History:  ?Past Surgical History:  ?Procedure Laterality Date  ? CATARACT EXTRACTION    ? 1997, 1998  ? CESAREAN SECTION  1983  ? CHOLECYSTECTOMY  1998  ? TUBAL LIGATION  1994  ? ? ?Assessment & Plan ?Clinical Impression:  Cherylene Ferrufino.Tondreau is a 66 year old right-handed female with history of hypertension hyperlipidemia, osteoarthritis bilateral knees, history of vasovagal syncope and tobacco use.  Per chart review patient lives alone independent prior to admission 1 level home 5 steps to entry.  She has multiple friends and family in the area to provide assistance.  By report patient went to bed on Thursday 3/16 and woke up on Friday with choreiform involuntary movements in her left arm and leg.  She was admitted 11/16/2021 with CT/MRI of the brain showing patchy and scattered small acute infarcts in the right MCA territory primarily the posterior right MCA division with some MCA/PCA watershed involvement.  No associated hemorrhage or mass effect.  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  CT angiogram head and neck no large vessel occlusion.  Echocardiogram with ejection fraction of 60 to 65% no wall motion abnormalities grade 1 diastolic dysfunction.  Admission chemistries unremarkable except glucose 102, alcohol negative, urine drug  screen negative, urinalysis negative nitrite.  Patient has been placed on aspirin 325 mg daily and Plavix 75 mg day x3 months then aspirin alone.  Subcutaneous Lovenox for DVT prophylaxis.  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  Maintain on a regular consistency diet. Patient transferred to CIR on 11/19/2021 .   ? ?Patient currently requires  CGA  with basic self-care skills secondary to muscle weakness, decreased coordination and decreased motor planning, decreased safety awareness, and decreased sitting balance, decreased standing balance, decreased postural control, hemiplegia, and decreased balance strategies.  Prior to hospitalization, patient could complete Adls and IADLs with independent . ? ?Patient will benefit from skilled intervention to increase independence with basic self-care skills prior to discharge home with care partner.  Anticipate patient will require intermittent supervision and follow up home health. ? ?OT - End of Session ?Activity Tolerance: Tolerates 30+ min activity without fatigue ?Endurance Deficit: No ?OT Assessment ?Rehab Potential (ACUTE ONLY): Excellent ?OT Barriers to Discharge: Lack of/limited family support ?OT Barriers to Discharge Comments: No assist available until this friday when dtr arrives from [place] ?OT Patient demonstrates impairments in the following area(s): Balance;Behavior;Motor;Safety ?OT Basic ADL's Functional Problem(s): Grooming;Bathing;Dressing;Toileting ?OT Advanced ADL's Functional Problem(s): Simple Meal Preparation ?OT Transfers Functional Problem(s): Toilet;Tub/Shower ?OT Plan ?OT Intensity: Minimum of 1-2 x/day, 45 to 90 minutes ?OT Frequency: 5 out of 7 days ?OT Duration/Estimated Length of Stay: 5-7 days ?OT Treatment/Interventions: Balance/vestibular training;Patient/family Clinical research associate;Therapeutic Activities;Psychosocial  support;Cognitive remediation/compensation;Therapeutic Exercise;Discharge planning;Functional mobility  training;Self Care/advanced ADL retraining;UE/LE Strength taining/ROM;Neuromuscular re-education;Disease mangement/prevention;UE/LE Coordination activities;Community reintegration ?OT Basic Self-Care Anticipated Outcome(s): mod I- sup ?OT Toileting Anticipated Outcome(s): mod I ?OT Bathroom Transfers Anticipated Outcome(s): mod I ?OT Recommendation ?Patient destination: Home ?Follow Up Recommendations: Home health OT;Outpatient OT ?Equipment Recommended: To be determined ?Equipment Details: has a quad cane ? ? ?OT Evaluation ?Precautions/Restrictions  ?Restrictions ?Weight Bearing Restrictions: No ?Pain ?Pain Assessment ?Pain Scale: 0-10 ?Pain Score: 0-No pain ?Home Living/Prior Functioning ?Home Living ?Family/patient expects to be discharged to:: Private residence ?Living Arrangements: Alone ?Available Help at Discharge: Family, Available PRN/intermittently (daughter coming from [place] to help temporarily for about a month) ?Type of Home: House ?Home Access: Stairs to enter ?Entrance Stairs-Number of Steps: 5 ?Entrance Stairs-Rails: Right, Left, Can reach both ?Home Layout: One level ?Bathroom Shower/Tub: Psychologist, counselling, Tub/shower unit ?Bathroom Toilet: Standard ?Bathroom Accessibility: Yes ? 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 Function ?Level of Independence: Independent with basic ADLs, Independent with transfers, Independent with gait, Independent with homemaking with ambulation ?Vision ?Baseline Vision/History: 1 Wears glasses ?Ability to See in Adequate Light: 0  Adequate ?Patient Visual Report: No change from baseline ?Vision Assessment?: No apparent visual deficits ?Perception  ?Perception: Within Functional LimitsPerception: WFL ?Praxis ?Praxis: Impaired ?Praxis Impairment Details: Motor planning ?Praxis-Other Comments: chorea LUE mild ?Cognition ?Cognition ?Overall Cognitive Status: Within Functional Limits for tasks assessed (Pt very talkative and slighlty tangential however possibly this is her baseline.) ?Arousal/Alertness: Awake/alert ?Orientation Level: Person;Place;Situation ?Person: Oriented ?Place: Oriented ?Situation: Oriented ?Memory: Appears intact ?Attention: Focused;Sustained ?Focused Attention: Appears intact ?Sustained Attention: Appears intact ?Awareness: Appears intact ?Problem Solving: Appears intact ?Behaviors: Restless (possibly baseline; mild) ?Safety/Judgment: Appears intact ?Brief Interview for Mental Status (BIMS) ?Repetition of Three Words (First Attempt): 3 ?Temporal Orientation: Year: Correct ?Temporal Orientation: Month: Accurate within 5 days ?Temporal Orientation: Day: Correct ?Recall: "Sock": Yes, no cue required ?Recall: "Blue": Yes, no cue required ?Recall: "Bed": Yes, no cue required ?BIMS Summary Score: 15 ?Sensation ?Sensation ?Light Touch: Appears Intact ?Hot/Cold: Appears Intact ?Proprioception: Impaired Detail ?Proprioception Impaired Details: Impaired LUE ?Stereognosis: Appears Intact ?Coordination ?Gross Motor Movements are Fluid and Coordinated: No ?Fine Motor Movements are Fluid and Coordinated: No ?Finger Nose Finger Test: intact BUE ?Heel Shin Test: slowed on L ?Motor  ?Motor ?Motor: Hemiplegia ?Motor - Skilled Clinical Observations: mild LLE; LUE WFL  ?Trunk/Postural Assessment  ?Cervical Assessment ?Cervical Assessment: Within Functional Limits ?Thoracic Assessment ?Thoracic Assessment: Within Functional Limits ?Lumbar Assessment ?Lumbar Assessment: Within Functional Limits ?Postural Control ?Postural Control: Within  Functional Limits ?Protective Responses: slighlty delayed  ?Balance ?Balance ?Balance Assessed: Yes ?Standardized Balance Assessment ?Standardized Balance Assessment: Sharlene Motts Balance Test;Timed Up and Go Test ?Berg Balance Test ?Sit to [facility]

## 2021-11-20 NOTE — Evaluation (Signed)
Physical Therapy Assessment and Plan ? ?Patient Details  ?Name: Veronica Weber ?MRN: [id number] ?Date of Birth: Oct 20, 1955 ? ?PT Diagnosis: Abnormality of gait, Hemiparesis dominant, and Muscle weakness ?Rehab Potential: Good ?ELOS: 3-5 days  ? ?Today's Date: 11/20/2021 ?PT Individual Time: 0900-1000 ?PT Individual Time Calculation (min): 60 min   ? ?Hospital Problem: Principal Problem: ?  Right middle cerebral artery stroke (HCC) ? ? ?Past Medical History:  ?Past Medical History:  ?Diagnosis Date  ? Arthritis   ? Cataract   ? Hyperlipidemia   ? Hypertension   ? Thyroid disease   ? Tobacco use   ? ?Past Surgical History:  ?Past Surgical History:  ?Procedure Laterality Date  ? CATARACT EXTRACTION    ? 1997, 1998  ? CESAREAN SECTION  1983  ? CHOLECYSTECTOMY  1998  ? TUBAL LIGATION  1994  ? ? ?Assessment & Plan ?Clinical Impression: Patient is a 66 year old right-handed female with history of hypertension hyperlipidemia, osteoarthritis bilateral knees, history of vasovagal syncope and tobacco use.  Per chart review patient lives alone independent prior to admission 1 level home 5 steps to entry.  She has multiple friends and family in the area to provide assistance.  By report patient went to bed on Thursday 3/16 and woke up on Friday with choreiform involuntary movements in her left arm and leg.  She was admitted 11/16/2021 with CT/MRI of the brain showing patchy and scattered small acute infarcts in the right MCA territory primarily the posterior right MCA division with some MCA/PCA watershed involvement.  No associated hemorrhage or mass effect.  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  CT angiogram head and neck no large vessel occlusion.  Echocardiogram with ejection fraction of 60 to 65% no wall motion abnormalities grade 1 diastolic dysfunction.  Admission chemistries unremarkable except glucose 102, alcohol negative, urine drug screen  negative, urinalysis negative nitrite.  Patient has been placed on aspirin 325 mg daily and Plavix 75 mg day x3 months then aspirin alone.  Subcutaneous Lovenox for DVT prophylaxis.  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  Maintain on a regular consistency diet.  Therapy evaluations completed due to patient decreased functional mobility was admitted for a comprehensive rehab program. Patient transferred to CIR on 11/19/2021 .  ? ?Patient currently requires  CGA  with mobility secondary to muscle weakness, decreased cardiorespiratoy endurance, and decreased standing balance, decreased postural control, hemiplegia, and decreased balance strategies.  Prior to hospitalization, patient was independent  with mobility and lived with Alone in a House home.  Home access is 5Stairs to enter. ? ?Patient will benefit from skilled PT intervention to maximize safe functional mobility, minimize fall risk, and decrease caregiver burden for planned discharge home with 24 hour supervision.  Anticipate patient will benefit from follow up HH at discharge. ? ?PT - End of Session ?Activity Tolerance: Tolerates 10 - 20 min activity with multiple rests ?Endurance Deficit: Yes ?Endurance Deficit Description: Required seated rest break after ambulating 168ft and also after navigating 4 steps ?PT Assessment ?Rehab Potential (ACUTE/IP ONLY): Good ?PT Barriers to Discharge: Decreased caregiver support;Lack of/limited family support;Insurance for SNF coverage;Behavior ?PT Patient demonstrates impairments in the following area(s): Balance;Endurance;Motor;Safety;Behavior ?PT Transfers Functional Problem(s): Bed Mobility;Bed to Chair;Car ?PT Locomotion Functional Problem(s): Ambulation;Stairs ?PT Plan ?PT Intensity: Minimum of 1-2 x/day ,45 to 90 minutes ?PT Frequency: 5 out of  7 days ?PT Duration Estimated Length of Stay: 3-5 days ?PT Treatment/Interventions:  Ambulation/gait training;Cognitive remediation/compensation;Discharge planning;DME/adaptive equipment instruction;Functional mobility training;Pain management;Psychosocial support;Splinting/orthotics;Therapeutic Activities;UE/LE Strength taining/ROM;Visual/perceptual remediation/compensation;Wheelchair propulsion/positioning;UE/LE Coordination activities;Therapeutic Exercise;Stair training;Patient/family education;Functional electrical stimulation;Disease management/prevention;Neuromuscular re-education;Community reintegration;Balance/vestibular training ?PT Transfers Anticipated Outcome(s): supervision ?PT Locomotion Anticipated Outcome(s): supervision ?PT Recommendation ?Recommendations for Other Services: Neuropsych consult ?Follow Up Recommendations: Home health PT;24 hour supervision/assistance ?Patient destination: Home ?Equipment Recommended: To be determined ? ? ?PT Evaluation ?Precautions/Restrictions ?Restrictions ?Weight Bearing Restrictions: No ?Pain ?Pain Assessment ?Pain Scale: 0-10 ?Pain Score: 0-No pain ?Pain Interference ?Pain Interference ?Pain Effect on Sleep: 4. Almost constantly ?Pain Interference with Therapy Activities: 2. Occasionally ?Pain Interference with Day-to-Day Activities: 2. Occasionally ?Home Living/Prior Functioning ?Home Living ?Living Arrangements: Alone ?Available Help at Discharge: Family;Available PRN/intermittently (dtr coming from [place]) ?Type of Home: House ?Home Access: Stairs to enter ?Entrance Stairs-Number of Steps: 5 ?Entrance Stairs-Rails: Right;Left;Can reach both ?Home Layout: One level ?Bathroom Shower/Tub: Psychologist, counselling;Tub/shower unit ?Bathroom Toilet: Standard ?Bathroom Accessibility: Yes ? Lives With: Alone ?Prior Function ?Level of Independence: Independent with basic ADLs;Independent with transfers;Independent with gait;Independent with homemaking with ambulation ? 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 mild) ?Safety/Judgment: Appears intact ?Sensation ?Sensation ?Light Touch: Appears Intact ?Hot/Cold: Appears Intact ?Proprioception: Impaired Detail ?Proprioception Impaired Details: Impaired LUE ?Stereognosis: Appears Intact ?Coordination ?Gross Motor Movements are Fluid and Coordinated: No ?Fine Motor Movements are Fluid and Coordinated: No ?Finger Nose Finger Test: intact BUE ?Heel Shin Test: slowed on L ?Motor  ?Motor ?Motor: Hemiplegia ?Motor - Skilled Clinical Observations: mild LLE; LUE WFL  ? ?Trunk/Postural Assessment  ?Cervical Assessment ?Cervical Assessment: Within Functional Limits ?Thoracic Assessment ?Thoracic Assessment: Within Functional Limits ?Lumbar Assessment ?Lumbar Assessment: Within Functional Limits ?Postural Control ?Postural Control: Within Functional Limits ?Protective Responses: slighlty delayed  ?Balance ?Balance ?Balance Assessed: Yes ?Standardized Balance Assessment ?Standardized Balance Assessment: Berg Balance Test;Timed Up and Go Test ?Berg Balance Test ?Sit to Stand: Able to stand without using hands and stabilize independently ?Standing Unsupported: Able to stand safely 2 minutes ?Sitting with Back Unsupported but Feet Supported on Floor or Stool: Able to sit safely and securely 2 minutes ?Stand to Sit: Sits safely with minimal use of hands ?Transfers: Able to  transfer with verbal cueing and /or supervision ?Standing Unsupported with Eyes Closed: Able to stand 10 seconds safely ?Standing Ubsupported with Feet Together: Able to place feet together independently and s

## 2021-11-20 NOTE — Progress Notes (Signed)
Patient ID: Veronica Weber, female   DOB: 04-Oct-1955, 66 y.o.   MRN: [id number]  Met with pt to give her a team conference update goals of supervision-mod/I level and discharge 3/25. She feels she is doing well but is worried about her dogs at home one is not eating since she has been here in the hospital. Her daughter is flying in Friday so can be there with her sat and the next two weeks, to make sure the transition is smooth. Pt is asking when she can return to work, informed her to discuss with the MD tomorrow when she is rounding. Will work on discharge for Sat. ?

## 2021-11-20 NOTE — Discharge Summary (Signed)
Physician Discharge Summary  ?Patient ID: ?Veronica Weber ?MRN: [id number] ?DOB/AGE: 05-04-1956 66 y.o. ? ?Admit date: 11/19/2021 ?Discharge date:  ?11/23/2021 ?Discharge Diagnoses:  ?Principal Problem: ?  Right middle cerebral artery stroke ([facility]) ?DVT prophylaxis ?Permissive hypertension ?Hyperlipidemia ?Tobacco use ?Obesity ? ?Discharged Condition: Stable ? ?Significant Diagnostic Studies: ?CT ANGIO HEAD NECK W WO CM ? ?Result Date: 11/17/2021 ?CLINICAL DATA:  Neuro deficit, acute, stroke suspected Stroke/TIA, determine embolic source EXAM: CT ANGIOGRAPHY HEAD AND NECK TECHNIQUE: Multidetector CT imaging of the head and neck was performed using the standard protocol during bolus administration of intravenous contrast. Multiplanar CT image reconstructions and MIPs were obtained to evaluate the vascular anatomy. Carotid stenosis measurements (when applicable) are obtained utilizing NASCET criteria, using the distal internal carotid diameter as the denominator. RADIATION DOSE REDUCTION: This exam was performed according to the departmental dose-optimization program which includes automated exposure control, adjustment of the mA and/or kV according to patient size and/or use of iterative reconstruction technique. CONTRAST:  51mL OMNIPAQUE IOHEXOL 350 MG/ML SOLN COMPARISON:  CT head November 16, 2021. MRI November 17, 2021. FINDINGS: CT HEAD FINDINGS Brain: Right posterior MCA territory infarcts better characterized on recent MRI. No evidence of progressive mass effect or acute hemorrhage. Contralateral remote left MCA territory infarct. Severe patchy white matter hypoattenuation is nonspecific but compatible with chronic microvascular ischemic disease. Mild cerebral atrophy. No hydrocephalus, mass lesion, midline shift, or extra-axial fluid collection. Vascular: See below. Skull: No acute fracture. Sinuses: Clear sinuses. Orbits: No acute finding. Review of the MIP images confirms the above findings CTA NECK FINDINGS  Aortic arch: Calcific atherosclerosis. Great vessel origins are patent without significant stenosis. Right carotid system: Atherosclerosis at the carotid bifurcation without greater than 50% stenosis. Tortuous ICA. Left carotid system: Atherosclerosis at the carotid bifurcation without greater than 50% stenosis. Tortuous ICA. Vertebral arteries: Mild stenosis of the proximal right vertebral artery. Both vertebral arteries are patent without evidence of significant (greater than 50%) stenosis. Skeleton: Mild-to-moderate multilevel degenerative change in the cervical spine. Other neck: No evidence of acute abnormality. Upper chest: Calcified granuloma in the left upper lobe. Otherwise, visualized lung apices are clear within limitation of expiratory imaging. Review of the MIP images confirms the above findings CTA HEAD FINDINGS Anterior circulation: Mild to moderate right and mild left atherosclerotic narrowing of the paraclinoid intracranial ICAs. Moderate to severe stenosis of the proximal right M1 MCA. No proximal right M2 MCA occlusion. Left MCA and bilateral AC is patent with multifocal mild atherosclerotic irregularity/narrowing. Posterior circulation: Bilateral intradural vertebral arteries, basilar artery and posterior cerebral arteries are patent without proximal hemodynamically significant stenosis. Small right and probably absent left P1 PCAs with prominent posterior communicating arteries bilaterally, anatomic variant. Venous sinuses: As permitted by contrast timing, patent. Anatomic variants: Detailed below. Review of the MIP images confirms the above findings IMPRESSION: 1. No large vessel occlusion. 2. 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 3. Mild-to-moderate right and mild left paraclinoid ICA stenosis. 4. No significant (greater than 50%) stenosis in the neck. Electronically Signed   By: Margaretha Sheffield M.D.   On: 11/17/2021 13:08  ? ?CT Head Wo Contrast ? ?Result Date:  11/16/2021 ?CLINICAL DATA:  Neuro deficit, acute, stroke suspected EXAM: CT HEAD WITHOUT CONTRAST TECHNIQUE: Contiguous axial images were obtained from the base of the skull through the vertex without intravenous contrast. RADIATION DOSE REDUCTION: This exam was performed according to the departmental dose-optimization program which includes automated exposure control, adjustment of the mA and/or kV according to  patient size and/or use of iterative reconstruction technique. COMPARISON:  None. FINDINGS: Brain: Brain volume is normal for age. No acute intracranial hemorrhage or subdural collection. There is advanced periventricular and deep white matter hypodensity with small area of focal encephalomalacia in the posterior left frontal/parietal lobe. No hydrocephalus, midline shift, or mass effect. Vascular: Atherosclerosis of skullbase vasculature without hyperdense vessel or abnormal calcification. Skull: No fracture or focal lesion. Sinuses/Orbits: Paranasal sinuses and mastoid air cells are clear. The visualized orbits are unremarkable. Bilateral cataract resection. Other: None. IMPRESSION: 1. No acute intracranial abnormality. 2. Periventricular white matter hypodensity is nonspecific, typically chronic small vessel ischemia, but age advanced. 3. Small focal encephalomalacia in the left posterior frontal/parietal lobe suggests prior infarct. Electronically Signed   By: Keith Rake M.D.   On: 11/16/2021 15:28  ? ?MR BRAIN WO CONTRAST ? ?Result Date: 11/17/2021 ?CLINICAL DATA:  66 year old female with acute neurologic deficit. EXAM: MRI HEAD WITHOUT CONTRAST TECHNIQUE: Multiplanar, multiecho pulse sequences of the brain and surrounding structures were obtained without intravenous contrast. COMPARISON:  Head CT 11/16/2021. FINDINGS: Brain: Multifocal scattered, small cortical and white matter infarcts in the posterior right MCA territory, with involvement of the postcentral gyrus and other posterior parietal lobe  structures. Mild associated right periatrial white matter involvement. Solitary small acute cortical infarct in the anterior right frontal lobe on series 5, image 79. And a several small superior right occipital lobe and a right lateral temporal lobe foci on series 5, image 70. No contralateral left hemisphere or posterior fossa involvement. Mild T2 and FLAIR hyperintensity in the affected areas with no associated hemorrhage or mass effect. No superimposed midline shift, mass effect, evidence of mass lesion, ventriculomegaly, extra-axial collection or acute intracranial hemorrhage. Cervicomedullary junction and pituitary are within normal limits. Extensive bilateral cerebral white matter T2 and FLAIR hyperintensity with chronic cortical encephalomalacia in the left parietal lobe, posterior left MCA territory. Posterior deep white matter capsules are affected. But the deep gray matter nuclei are relatively spared. Mild to moderate T2 heterogeneity in the pons. Negative cerebellum. No chronic cerebral blood products identified. Vascular: Major intracranial vascular flow voids are preserved. Skull and upper cervical spine: Negative for age visible cervical spine. Visualized bone marrow signal is within normal limits. Sinuses/Orbits: Postoperative changes to both globes. Paranasal Visualized paranasal sinuses and mastoids are stable and well aerated. Other: Visible internal auditory structures appear normal. Negative visible scalp and face. IMPRESSION: 1. Patchy and scattered small acute infarcts in the Right MCA territory, primarily the posterior right MCA division with some MCA/PCA watershed involved. No associated hemorrhage or mass effect. 2. Chronic contralateral posterior left MCA territory infarct, and severe bilateral cerebral white matter disease. Electronically Signed   By: Genevie Ann M.D.   On: 11/17/2021 04:34  ? ?MR Lumbar Spine w/o contrast ? ?Result Date: 11/06/2021 ?CLINICAL DATA:  Chronic low back pain,  evaluate for compression fracture after fall EXAM: MRI LUMBAR SPINE WITHOUT CONTRAST TECHNIQUE: Multiplanar, multisequence MR imaging of the lumbar spine was performed. No intravenous contrast was administered. COMPARISON

## 2021-11-20 NOTE — Progress Notes (Signed)
Physical Therapy Session Note ? ?Patient Details  ?Name: Veronica Weber ?MRN: [id number] ?Date of Birth: [birth year] ? ?Today's Date: 11/20/2021 ?PT Individual Time: 1300-1410 ?PT Individual Time Calculation (min): 70 min  ? ?Short Term Goals: ?Week 1:  PT Short Term Goal 1 (Week 1): STG = LTG ? ?Skilled Therapeutic Interventions/Progress Updates:  ? ?Pt sitting in recliner, agreeable to PT tx. Pt denies pain. Substituted hospital socks for her slip-on shoes (slip on shoes have no traction on bottom of shoes, tend to slide). Pt hyperverbose during session. She was also mildly ?impulsive during session (I.e she did a "ballerina" twirl during FGA assessment). Sit<>Stand with no AD and supervision assist. Ambulated from her room downstairs to 68M rehab gym with CGA and no AD with standing rest break in elevator. Primary gait deficits include wide BOS, lateral trunk lean on L, and lateral instability. Completed car transfer with CGA with car height simulating her daughter's. We also practiced ambulating up/down ramp to work on unlevel surfaces - CGA and no AD. Completed furniture transfers with supervision and bed mobility on regular flat bed with supervision as well. Worked on dynamic standing balance in // bars with inverted bosu ball - worked on static standing, A/P and lateral weight shifting, and 1x10 mini-squats on inverted ball - CGA provided during all bosu ball tasks. Concluded session with FGA assessment. See below for details.  ? ?Patient demonstrates increased fall risk as noted by score of 18/30 on  Functional Gait Assessment.   ?<22/30 = predictive of falls, <20/30 = fall in 6 months, <18/30 = predictive of falls in PD ?MCID: 5 points stroke population, 4 points geriatric population ?(ANPTA Core Set of Outcome Measures for Adults with Neurologic Conditions, 2018) ? ?She ambulated back upstairs to her room with CGA and no AD with similar deficits as described above. We spent time discussing safety  awareness, fall prevention, slowing down her activity to improve safety, and working on undivided attention during tasks.  Concluded session supine in bed with bed alarm on, call bell in lap, all items within reach. NT at bedside for vitals.  ? ?Therapy Documentation ?Precautions:  ?Precautions ?Precautions: Fall ?Precaution Comments: left hemichorea ?Restrictions ?Weight Bearing Restrictions: No ?General: ?  ? ?Balance ?Balance Assessed: Yes ?Standardized Balance Assessment ?Standardized Balance Assessment: Functional Gait Assessment ?Functional Gait  Assessment ?Gait assessed : Yes ?Gait Level Surface: Walks 20 ft in less than 7 sec but greater than 5.5 sec, uses assistive device, slower speed, mild gait deviations, or deviates 6-10 in outside of the 12 in walkway width. ?Change in Gait Speed: Able to smoothly change walking speed without loss of balance or gait deviation. Deviate no more than 6 in outside of the 12 in walkway width. ?Gait with Horizontal Head Turns: Performs head turns smoothly with no change in gait. Deviates no more than 6 in outside 12 in walkway width ?Gait with Vertical Head Turns: Performs head turns with no change in gait. Deviates no more than 6 in outside 12 in walkway width. ?Gait and Pivot Turn: Turns slowly, requires verbal cueing, or requires several small steps to catch balance following turn and stop ?Step Over Obstacle: Is able to step over one shoe box (4.5 in total height) but must slow down and adjust steps to clear box safely. May require verbal cueing. ?Gait with Narrow Base of Support: Ambulates less than 4 steps heel to toe or cannot perform without assistance. ?Gait with Eyes Closed: Walks 20 ft, slow speed, abnormal gait  pattern, evidence for imbalance, deviates 10-15 in outside 12 in walkway width. Requires more than 9 sec to ambulate 20 ft. ?Ambulating Backwards: Walks 20 ft, uses assistive device, slower speed, mild gait deviations, deviates 6-10 in outside 12 in  walkway width. ?Steps: Alternating feet, must use rail. ?Total Score: 18/30 ? ?Therapy/Group: Individual Therapy ? ?Nicklas Mcsweeney P Dirck Butch ?11/20/2021, 1:15 PM  ?

## 2021-11-20 NOTE — Progress Notes (Signed)
Inpatient Rehabilitation  Patient information reviewed and entered into eRehab system by Germain Koopmann M. Latresha Yahr, M.A., CCC/SLP, PPS Coordinator.  Information including medical coding, functional ability and quality indicators will be reviewed and updated through discharge.    

## 2021-11-20 NOTE — Plan of Care (Signed)
?  Problem: RH Grooming ?Goal: LTG Patient will perform grooming w/assist,cues/equip (OT) ?Description: LTG: Patient will perform grooming with assist, with/without cues using equipment (OT) ?Flowsheets (Taken 11/20/2021 1245) ?LTG: Pt will perform grooming with assistance level of: Independent ?  ?Problem: RH Bathing ?Goal: LTG Patient will bathe all body parts with assist levels (OT) ?Description: LTG: Patient will bathe all body parts with assist levels (OT) ?Flowsheets (Taken 11/20/2021 1245) ?LTG: Pt will perform bathing with assistance level/cueing: Independent with assistive device  ?LTG: Position pt will perform bathing: Shower ?  ?Problem: RH Dressing ?Goal: LTG Patient will perform upper body dressing (OT) ?Description: LTG Patient will perform upper body dressing with assist, with/without cues (OT). ?Flowsheets (Taken 11/20/2021 1245) ?LTG: Pt will perform upper body dressing with assistance level of: Independent ?Goal: LTG Patient will perform lower body dressing w/assist (OT) ?Description: LTG: Patient will perform lower body dressing with assist, with/without cues in positioning using equipment (OT) ?Flowsheets (Taken 11/20/2021 1245) ?LTG: Pt will perform lower body dressing with assistance level of: Independent ?  ?Problem: RH Toileting ?Goal: LTG Patient will perform toileting task (3/3 steps) with assistance level (OT) ?Description: LTG: Patient will perform toileting task (3/3 steps) with assistance level (OT)  ?Flowsheets (Taken 11/20/2021 1245) ?LTG: Pt will perform toileting task (3/3 steps) with assistance level: Independent ?  ?Problem: RH Simple Meal Prep ?Goal: LTG Patient will perform simple meal prep w/assist (OT) ?Description: LTG: Patient will perform simple meal prep with assistance, with/without cues (OT). ?Flowsheets (Taken 11/20/2021 1245) ?LTG: Pt will perform simple meal prep with assistance level of: Supervision/Verbal cueing ?  ?Problem: RH Toilet Transfers ?Goal: LTG Patient will  perform toilet transfers w/assist (OT) ?Description: LTG: Patient will perform toilet transfers with assist, with/without cues using equipment (OT) ?Flowsheets (Taken 11/20/2021 1245) ?LTG: Pt will perform toilet transfers with assistance level of: Independent ?  ?Problem: RH Tub/Shower Transfers ?Goal: LTG Patient will perform tub/shower transfers w/assist (OT) ?Description: LTG: Patient will perform tub/shower transfers with assist, with/without cues using equipment (OT) ?Flowsheets (Taken 11/20/2021 1245) ?LTG: Pt will perform tub/shower stall transfers with assistance level of: Supervision/Verbal cueing ?LTG: Pt will perform tub/shower transfers from: Tub/shower combination ?  ?

## 2021-11-20 NOTE — Progress Notes (Signed)
?                                                       PROGRESS NOTE ? ? ?Subjective/Complaints: ?Woke frequently last night- combination of waking herself and staff coming into her room ?Has bilateral knee pain, worst on her left side, would like to try lidocaine patch left knee ? ?ROS: L>R knee pain ? ?Objective: ?  ?No results found. ?Recent Labs  ?  11/20/21 ?0624  ?WBC 6.0  ?HGB 13.3  ?HCT 39.8  ?PLT 270  ? ?Recent Labs  ?  11/20/21 ?0624  ?NA 136  ?K 3.8  ?CL 103  ?CO2 27  ?GLUCOSE 100*  ?BUN 12  ?CREATININE 0.77  ?CALCIUM 9.7  ? ? ?Intake/Output Summary (Last 24 hours) at 11/20/2021 1104 ?Last data filed at 11/20/2021 0700 ?Gross per 24 hour  ?Intake 360 ml  ?Output --  ?Net 360 ml  ?  ? ?  ? ?Physical Exam: ?Vital Signs ?Blood pressure 114/70, pulse 93, temperature 98 ?F (36.7 ?C), temperature source Oral, resp. rate 18, height 5' (1.524 m), weight 93 kg, SpO2 96 %. ? ?Gen: no distress, normal appearing ?HEENT: oral mucosa pink and moist, NCAT ?Cardio: Reg rate ?Chest: normal effort, normal rate of breathing ?Abd: soft, non-distended ?Ext: no edema ?Psych: pleasant, normal affect ?Musculoskeletal:  ?   Cervical back: Neck supple. No tenderness.  ?   Comments: 5-/5 throughout in all 4 extremities. Left knee TTP ?Skin: ?   General: Skin is warm and dry.  ?   Comments: L forarm-inner- straight line with curve burn from oven at home- red ?L AC fossa bruising and R upper inner arm purple bruising  ?Neurological:  ?   Mental Status: She is alert and oriented to person, place, and time.  ?   Comments: Patient is alert.  Oriented x3.  No acute distress.  Follows full commands. 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   Mood and Affect: Mood normal.     ?   Behavior: Behavior normal.     ?   Thought Content: Thought content normal. ? ?Assessment/Plan: ?1. Functional deficits which require 3+ hours per day of interdisciplinary therapy in a  comprehensive inpatient rehab setting. ?Physiatrist is providing close team supervision and 24 hour management of active medical problems listed below. ?Physiatrist and rehab team continue to assess barriers to discharge/monitor patient progress toward functional and medical goals ? ?Care Tool: ? ?Bathing ?   ?   ?   ?  ?  ?Bathing assist   ?  ?  ?Upper Body Dressing/Undressing ?Upper body dressing   ?  ?   ?Upper body assist   ?   ?Lower Body Dressing/Undressing ?Lower body dressing ? ? ?   ?  ? ?  ? ?Lower body assist   ?   ? ?Toileting ?Toileting    ?Toileting assist   ?  ?  ?Transfers ?Chair/bed transfer ? ?Transfers assist ?   ? ?  ?  ?  ?Locomotion ?Ambulation ? ? ?Ambulation assist ? ?   ? ?  ?  ?   ? ?Walk 10 feet activity ? ? ?Assist ?   ? ?  ?   ? ?  Walk 50 feet activity ? ? ?Assist   ? ?  ?   ? ? ?Walk 150 feet activity ? ? ?Assist   ? ?  ?  ?  ? ?Walk 10 feet on uneven surface  ?activity ? ? ?Assist   ? ? ?  ?   ? ?Wheelchair ? ? ? ? ?Assist   ?  ?  ? ?  ?   ? ? ?Wheelchair 50 feet with 2 turns activity ? ? ? ?Assist ? ?  ?  ? ? ?   ? ?Wheelchair 150 feet activity  ? ? ? ?Assist ?   ? ? ?   ? ?Blood pressure 114/70, pulse 93, temperature 98 ?F (36.7 ?C), temperature source Oral, resp. rate 18, height 5' (1.524 m), weight 93 kg, SpO2 96 %. ? ?Medical Problem List and Plan: ?1. 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            -patient may  shower ?            -ELOS/Goals: 4 days mod I/S ? Admit to CIR ?2.  Antithrombotics: ?-DVT/anticoagulation:  Pharmaceutical: Lovenox ?            -antiplatelet therapy: continue Aspirin 325 mg daily and Plavix 75 mg day x3 months then aspirin alone ?3. Left knee pain: lidocaine patch ordered Tylenol as needed ?4. Mood: Provide emotional support ?            -antipsychotic agents: Abilify 5 mg daily ?5. Neuropsych: This patient is  capable of making decisions on her own behalf. ?6. Skin/Wound Care: Routine skin checks ?7. Fluids/Electrolytes/Nutrition: Routine in and outs with follow-up chemistries ?8.  Hyperlipidemia.  Lipitor ?9.  History of tobacco abuse.  NicoDerm patch.  Provide counseling. ?10.  Permissive hypertension.  Patient on chlorthalidone 25 mg daily and Cozaar 50 mg daily prior to admission.  Resume as needed. Add magnesium 250mg  HS ?11. Dizziness with stairs: recommended drinking 6-8 glasses of water per dau ? ?LOS: ?1 days ?A FACE TO FACE EVALUATION WAS PERFORMED ? ? P Francine Hannan ?11/20/2021, 11:04 AM  ? ?  ?

## 2021-11-20 NOTE — Progress Notes (Signed)
Patient up in high back chair in her room. Denies pain or discomfort at this time. Personal items within  reach, safety maintained.  ?

## 2021-11-20 NOTE — Progress Notes (Signed)
Patient ID: Veronica Weber, female   DOB: 1956/05/30, 66 y.o.   MRN: [id number] ?Met with the patient to introduce self, review rehab process, team conference and plan of care. Discussed secondary risks including HTN, HLD, smoking and medications. Reviewed DAPT x 3 months then ASA solo. Patient reportedly anxious to go home; concerned about family. Continue to follow along to discharge to address educational needs to facilitate preparation for discharge. Dorien Chihuahua B ? ?

## 2021-11-21 NOTE — Progress Notes (Signed)
Inpatient Rehabilitation Discharge Medication Review by a Pharmacist ? ?A complete drug regimen review was completed for this patient to identify any potential clinically significant medication issues. ? ?High Risk Drug Classes Is patient taking? Indication by Medication  ?Antipsychotic Yes Abilify for agitation, mood  ?Anticoagulant No   ?Antibiotic No   ?Opioid No   ?Antiplatelet Yes Plavix, aspirin for CVA  ?Hypoglycemics/insulin No   ?Vasoactive Medication Yes Chlorthalidone, losartan for BP  ?Chemotherapy No   ?Other Yes Lipitor for HLD  ? ? ? ?Type of Medication Issue Identified Description of Issue Recommendation(s)  ?Drug Interaction(s) (clinically significant) ?    ?Duplicate Therapy ?    ?Allergy ?    ?No Medication Administration End Date ?    ?Incorrect Dose ?    ?Additional Drug Therapy Needed ?    ?Significant med changes from prior encounter (inform family/care partners about these prior to discharge).    ?Other ?    ? ? ?Clinically significant medication issues were identified that warrant physician communication and completion of prescribed/recommended actions by midnight of the next day:  No ? ?Pharmacist comments: None ? ?Time spent performing this drug regimen review (minutes): 20 minutes ? ? ?Veronica Weber ?11/21/2021 9:54 AM ?

## 2021-11-21 NOTE — Progress Notes (Signed)
Occupational Therapy Session Note ? ?Patient Details  ?Name: Veronica Weber ?MRN: [id number] ?Date of Birth: 27-Mar-1956 ? ?Today's Date: 11/21/2021 ?OT Individual Time: 6659-9357 ?OT Individual Time Calculation (min): 30 min  ? ? ?Short Term Goals: ?Week 1:  OT Short Term Goal 1 (Week 1): STGs=LTGs due to ELOS ? ?Skilled Therapeutic Interventions/Progress Updates:  ?   ?Pt received seated in w/c, no c/o pain, agreeable to therapy. Session focus on activity tolerance, transfer retraining, dynamic standing balance in prep for improved ADL/IADL/func mobility performance + decreased caregiver burden. Total A w/c transport to hospital atrium for time management/ energy conservation.  ? ?Pt ambulated throughout busy gift shop environment with CGA due to mild impulsivity, carryover from previous therapy sessions to slow down. Able to locate 5 "shopping list" items when given list verbally x2. Discussed reasonable modifications for return to work, including modifying schedule and sitting for tasks that can be done seated (is Scientist, water quality at [employer]). Reports MD has already agreed to write a letter to assist with receiving modifications.  ? ?Pt ambulated through atrium chairs/tables and outside on uneven surfaces with overall CGA due to increasing fatigue. Appreciative of being able to go outside.  ? ?Pt hyperverbal and mildly distracted throughout session, appears baseline. ? ?Pt left seated in w/c with safety belt alarm engaged, call bell in reach, and all immediate needs met.  ? ?Therapy Documentation ?Precautions:  ?Precautions ?Precautions: Fall ?Precaution Comments: left hemichorea ?Restrictions ?Weight Bearing Restrictions: No ? ?Pain: no c/o throughout ?  ?ADL: See Care Tool for more details. ? ?Therapy/Group: Individual Therapy ? ?Volanda Napoleon MS, OTR/L ? ?11/21/2021, 6:57 AM ?

## 2021-11-21 NOTE — Progress Notes (Signed)
Inpatient Rehabilitation Care Coordinator ?Discharge Note DC SAT 3/25 ? ?Patient Details  ?Name: Veronica Weber ?MRN: [id number] ?Date of Birth: 06/01/1956 ? ? ?Discharge location: HOME WITH DAUGHTER WHO HAS FLOWN IN FROM [place] TO STAY FOR A FEW WEEKS FOR TRANSITION ? ?Length of Stay:  4 DAYS ? ?Discharge activity level: SUPERVISION-MOD/I LEVEL ? ?Home/community participation: ACTIVE ? ?Patient response PI:RJJOAC Literacy - How often do you need to have someone help you when you read instructions, pamphlets, or other written material from your doctor or pharmacy?: Never ? ?Patient response ZY:SAYTKZ Isolation - How often do you feel lonely or isolated from those around you?: Never ? ?Services provided included: MD, RD, PT, OT, RN, CM, Pharmacy, SW ? ?Financial Services:  ?Field seismologist Utilized: Private Insurance ?[employer]HC-MEDICARE ? ?Choices offered to/list presented to: PT ? ?Follow-up services arranged:  ?Home Health, DME, Patient/Family has no preference for HH/DME agencies ?Home Health Agency: [facility]  ?  ?DME : [facility] ?  ? ?Patient response to transportation need: ?Is the patient able to respond to transportation needs?: Yes ?In the past 12 months, has lack of transportation kept you from medical appointments or from getting medications?: No ?In the past 12 months, has lack of transportation kept you from meetings, work, or from getting things needed for daily living?: No ? ? ? ?Comments (or additional information):PT DID WELL AND REACHED HER GOALS. WANTS TO KNOW WHEN CAN RETURN TO WORK. DAUGHTER IS FLYING IN FROM [place] TO STAY FOR A FEW WEEKS FOR TRANSITION.  ? ?Patient/Family verbalized understanding of follow-up arrangements:  Yes ? ?Individual responsible for coordination of the follow-up plan: SELF AND PARISA-DAUGHTER [phone] ? ?Confirmed correct DME delivered: Lucy Chris 11/21/2021   ? ?Lucy Chris ?

## 2021-11-21 NOTE — Progress Notes (Signed)
Occupational Therapy Session Note ? ?Patient Details  ?Name: Veronica Weber ?MRN: [id number] ?Date of Birth: 1956/05/29 ? ?Today's Date: 11/21/2021 ?OT Individual Time: 3335-4562 ?OT Individual Time Calculation (min): 45 min  ? ? ?Short Term Goals: ?Week 1:  OT Short Term Goal 1 (Week 1): STGs=LTGs due to ELOS ? ?Skilled Therapeutic Interventions/Progress Updates:  ?  Pt semi reclined in bed, reporting stiffness and aching in ankles with mobility.  Requesting to change clothes during this session.  Educated pt on current level of function and importance of slowing pace of movements as well as reducing external distractions and also reducing internal distractions during functional mobility and self care to increase safety.  Discussed importance of self correction and compensatory strategy with use of a positive mantra (pt chose, "slow down [name], you can do this") to assist in staying on task.  Pt demonstrated excellent follow through of strategy during in room mobility and doffed/donned shirt, underwear, and shorts including ADL item retrieval, then ambulated to bathroom and completed toilet transfer and toileting (continent of urine) all with distant supervision and needing only min Vcs to assist with safety awareness.   Call bell in reach, seat alarm on at end of session. ? ?Therapy Documentation ?Precautions:  ?Precautions ?Precautions: Fall ?Precaution Comments: left hemichorea ?Restrictions ?Weight Bearing Restrictions: No ? ? ?Therapy/Group: Individual Therapy ? ?Dian Situ Vesper Trant ?11/21/2021, 11:14 AM ?

## 2021-11-21 NOTE — Progress Notes (Signed)
?                                                       PROGRESS NOTE ? ? ?Subjective/Complaints: ?No new complaints this morning ?She is content with d/c date ?Asks when she can return to work- she will need note ? ?ROS: L>R knee pain, denies insomnia ? ?Objective: ?  ?No results found. ?Recent Labs  ?  11/20/21 ?0624  ?WBC 6.0  ?HGB 13.3  ?HCT 39.8  ?PLT 270  ? ?Recent Labs  ?  11/20/21 ?0624  ?NA 136  ?K 3.8  ?CL 103  ?CO2 27  ?GLUCOSE 100*  ?BUN 12  ?CREATININE 0.77  ?CALCIUM 9.7  ? ? ?Intake/Output Summary (Last 24 hours) at 11/21/2021 1843 ?Last data filed at 11/21/2021 1825 ?Gross per 24 hour  ?Intake 656 ml  ?Output --  ?Net 656 ml  ?  ? ?  ? ?Physical Exam: ?Vital Signs ?Blood pressure 129/72, pulse 86, temperature 97.6 ?F (36.4 ?C), temperature source Oral, resp. rate 17, height 5' (1.524 m), weight 93 kg, SpO2 97 %. ? ?Gen: no distress, normal appearing ?HEENT: oral mucosa pink and moist, NCAT ?Cardio: Reg rate ?Chest: normal effort, normal rate of breathing ?Abd: soft, non-distended ?Ext: no edema ?Psych: pleasant, normal affect ?Musculoskeletal:  ?   Cervical back: Neck supple. No tenderness.  ?   Comments: 5-/5 throughout in all 4 extremities. Left knee TTP ?Skin: ?   General: Skin is warm and dry.  ?   Comments: L forarm-inner- straight line with curve burn from oven at home- red ?L AC fossa bruising and R upper inner arm purple bruising  ?Neurological:  ?   Mental Status: She is alert and oriented to person, place, and time.  ?   Comments: Patient is alert.  Oriented x3.  No acute distress.  Follows full commands. 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   Mood and Affect: Mood normal.     ?   Behavior: Behavior normal.     ?   Thought Content: Thought content normal. ? ?Assessment/Plan: ?1. Functional deficits which require 3+ hours per day of interdisciplinary therapy in a comprehensive inpatient rehab setting. ?Physiatrist is  providing close team supervision and 24 hour management of active medical problems listed below. ?Physiatrist and rehab team continue to assess barriers to discharge/monitor patient progress toward functional and medical goals ? ?Care Tool: ? ?Bathing ?   ?   ?   ?  ?  ?Bathing assist Assist Level: Contact Guard/Touching assist ?  ?  ?Upper Body Dressing/Undressing ?Upper body dressing   ?What is the patient wearing?: Pull over shirt ?   ?Upper body assist Assist Level: Set up assist ?   ?Lower Body Dressing/Undressing ?Lower body dressing ? ? ?   ?  ? ?  ? ?Lower body assist Assist for lower body dressing: Contact Guard/Touching assist ?   ? ?Toileting ?Toileting    ?Toileting assist Assist for toileting: Contact Guard/Touching assist ?  ?  ?Transfers ?Chair/bed transfer ? ?Transfers assist ?   ? ?Chair/bed transfer assist level: Contact Guard/Touching assist ?  ?  ?Locomotion ?Ambulation ? ? ?Ambulation assist ? ?   ? ?Assist level: Contact Guard/Touching  assist ?Assistive device: No Device ?Max distance: 132ft  ? ?Walk 10 feet activity ? ? ?Assist ?   ? ?Assist level: Contact Guard/Touching assist ?Assistive device: No Device  ? ?Walk 50 feet activity ? ? ?Assist   ? ?Assist level: Contact Guard/Touching assist ?Assistive device: No Device  ? ? ?Walk 150 feet activity ? ? ?Assist   ? ?Assist level: Contact Guard/Touching assist ?Assistive device: No Device ?  ? ?Walk 10 feet on uneven surface  ?activity ? ? ?Assist   ? ? ?Assist level: Minimal Assistance - Patient > 75% ?   ? ?Wheelchair ? ? ? ? ?Assist Is the patient using a wheelchair?: No ?  ?  ? ?  ?   ? ? ?Wheelchair 50 feet with 2 turns activity ? ? ? ?Assist ? ?  ?  ? ? ?   ? ?Wheelchair 150 feet activity  ? ? ? ?Assist ?   ? ? ?   ? ?Blood pressure 129/72, pulse 86, temperature 97.6 ?F (36.4 ?C), temperature source Oral, resp. rate 17, height 5' (1.524 m), weight 93 kg, SpO2 97 %. ? ?Medical Problem List and Plan: ?1. 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            -patient may  shower ?            -ELOS/Goals: 4 days mod I/S ? Continue CIR ? Messaged Lisa to schedule HFU ?2.  Impaired mobility, ambulating 300 feet: d/c lovenox. ?            -antiplatelet therapy: continue Aspirin 325 mg daily and Plavix 75 mg day x3 months then aspirin alone ?3. Left knee pain: continue lidocaine patch ordered Tylenol as needed ?4. Mood: Provide emotional support ?            -antipsychotic agents: Abilify 5 mg daily ?5. Neuropsych: This patient is capable of making decisions on her own behalf. ?6. Skin/Wound Care: Routine skin checks ?7. Fluids/Electrolytes/Nutrition: Routine in and outs with follow-up chemistries ?8.  Hyperlipidemia. LDL 120, continue Lipitor ?9.  History of tobacco abuse.  continue NicoDerm patch 21mg . Provide counseling. ?10.  Permissive hypertension.  Patient on chlorthalidone 25 mg daily and Cozaar 50 mg daily prior to admission.  Resume as needed. Add magnesium 250mg  HS ?11. Dizziness with stairs: recommended drinking 6-8 glasses of water per day ? ?LOS: ?2 days ?A FACE TO FACE EVALUATION WAS PERFORMED ? ? P Sun Kihn ?11/21/2021, 6:43 PM  ? ?  ?

## 2021-11-21 NOTE — Progress Notes (Signed)
Occupational Therapy Session Note ? ?Patient Details  ?Name: Veronica Weber ?MRN: [id number] ?Date of Birth: Dec 20, 1955 ? ?Today's Date: 11/21/2021 ?OT Individual Time: 1104-1200 ?OT Individual Time Calculation (min): 56 min  ? ? ?Short Term Goals: ?Week 1:  OT Short Term Goal 1 (Week 1): STGs=LTGs due to ELOS ? ?Skilled Therapeutic Interventions/Progress Updates:  ?Pt greeted seated in w/c  agreeable to OT intervention. Session focus on BADL reeducation, functional mobility, LUE coordination dynamic standing balance and decreasing overall caregiver burden.  Pt completed functional sit>stands throughout session with no AD and CGA. Pt completed therapeutic activity of engaging in wii bowling with LUE with an emphasis on dynamic standing balance, LUE coordination, and motor planning. Pt completed task with overall CGA, graded task up and had pt complete task on compliant surface to challenge balance, pt completed task with [facility] with near LOB to L side. Pt completed additional balance task standing on compliant surface to recreate leggo structure from visual aid provided, pt needed increased time but able to complete structure with CGA. Pt transported back to room with total A where pt ambulated to bathroom with no AD and CGA and completed 3/3 toileting tasks with supervision.pt ambulated back to w/c with CGA.     pt left  seated in w/c with alarm belt activated and all needs within reach.                    ?Therapy Documentation ?Precautions:  ?Precautions ?Precautions: Fall ?Precaution Comments: left hemichorea ?Restrictions ?Weight Bearing Restrictions: No ? ?Pain: unrated pain reported in L ankle, rest breaks provided as needed.  ? ? ? ?Therapy/Group: Individual Therapy ? ?Precious Haws ?11/21/2021, 12:05 PM ?

## 2021-11-21 NOTE — Discharge Summary (Shared)
Physical Therapy Discharge Summary ? ?Patient Details  ?Name: Veronica Weber ?MRN: [id number] ?Date of Birth: [birth year] ? ?{CHL IP REHAB PT TIME CALCULATION:[id number]} ? ? ?Patient has met 8 of 8 long term goals due to improved activity tolerance, improved balance, increased strength, functional use of  left upper extremity and left lower extremity, improved attention, improved awareness, and improved coordination.  Patient to discharge at an ambulatory level Supervision.   Patient's care partner is independent to provide the necessary physical assistance at discharge. Patient's daughter who lives in [place], will be living with her at discharge to provide 24/7 supervision. ? ?Reasons goals not met: n/a ? ?Recommendation:  ?Patient will benefit from ongoing skilled PT services in home health setting to continue to advance safe functional mobility, address ongoing impairments in L sided weakness and coordination deficits, dynamic standing balance, activity tolerance, home safety, and minimize fall risk. ? ?Equipment: ?No equipment provided ? ?Reasons for discharge: treatment goals met and discharge from hospital ? ?Patient/family agrees with progress made and goals achieved: Yes ? ?PT Discharge ?Precautions/Restrictions ?Precautions ?Precautions: Fall ?Precaution Comments: left hemichorea (mild) ?Restrictions ?Weight Bearing Restrictions: No ?Pain ?Pain Assessment ?Pain Score: 1  ?Pain Interference ?  *** ?Vision/Perception  ?Vision - History ?Ability to See in Adequate Light: 0 Adequate ?Perception ?Perception: Within Functional Limits ?Praxis ?Praxis: Intact  ?Cognition ?Overall Cognitive Status: Within Functional Limits for tasks assessed (hyperverbose, short attention - suspect baseline personality) ?Arousal/Alertness: Awake/alert ?Orientation Level: Oriented X4 ?Focused Attention: Appears intact ?Sustained Attention: Appears intact ?Memory: Appears intact ?Awareness: Appears intact ?Problem Solving:  Appears intact ?Reasoning: Appears intact ?Safety/Judgment: Impaired ?Comments: questionable safety/judgement during complex functional tasks. Very quick to move and quick decision making ?Sensation ?Sensation ?Light Touch: Appears Intact ?Hot/Cold: Appears Intact ?Proprioception: Impaired Detail ?Proprioception Impaired Details: Impaired LUE ?Stereognosis: Appears Intact ?Coordination ?Gross Motor Movements are Fluid and Coordinated: No ?Fine Motor Movements are Fluid and Coordinated: No ?Finger Nose Finger Test: intact BUE ?Heel Shin Test: slowed on L ?Motor  ?Motor ?Motor: Hemiplegia ?Motor - Discharge Observations: mild LLE; LUE WFL  ?Mobility ?Bed Mobility ?Bed Mobility: Supine to Sit;Sit to Supine ?Supine to Sit: Independent ?Sit to Supine: Independent ?Transfers ?Transfers: Sit to Stand;Stand Pivot Transfers;Stand to Sit ?Sit to Stand: Supervision/Verbal cueing ?Stand to Sit: Supervision/Verbal cueing ?Stand Pivot Transfers: Supervision/Verbal cueing ?Stand Pivot Transfer Details: Verbal cues for precautions/safety ?Transfer (Assistive device): None ?Locomotion  ?Gait ?Ambulation: Yes ?Gait Assistance: Supervision/Verbal cueing ?Gait Distance (Feet): 200 Feet ?Assistive device: None ?Gait Assistance Details: Verbal cues for precautions/safety ?Gait ?Gait: Yes ?Gait Pattern: Impaired ?Gait Pattern: Step-through pattern;Wide base of support;Trunk flexed;Lateral trunk lean to left ?Gait velocity: decreased ?Stairs / Additional Locomotion ?Stairs: Yes ?Stairs Assistance: Supervision/Verbal cueing ?Stair Management Technique: Two rails ?Number of Stairs: 12 ?Height of Stairs: 6 ?Wheelchair Mobility ?Wheelchair Mobility: No  ?Trunk/Postural Assessment  ?Cervical Assessment ?Cervical Assessment: Within Functional Limits ?Lumbar Assessment ?Lumbar Assessment: Within Functional Limits ?Postural Control ?Postural Control: Within Functional Limits  ?Balance ?Balance ?Balance Assessed: Yes ?Static Sitting Balance ?Static  Sitting - Balance Support: Feet unsupported;No upper extremity supported ?Static Sitting - Level of Assistance: 7: Independent ?Dynamic Sitting Balance ?Dynamic Sitting - Balance Support: During functional activity;Feet supported ?Dynamic Sitting - Level of Assistance: 7: Independent ?Static Standing Balance ?Static Standing - Balance Support: No upper extremity supported ?Static Standing - Level of Assistance: 5: Stand by assistance ?Dynamic Standing Balance ?Dynamic Standing - Balance Support: During functional activity;No upper extremity supported ?Dynamic Standing - Level of Assistance: 5: Stand by  assistance ?*** BERG *** ?Extremity Assessment  ?  ?  ? *** ?  ? ? ? ?Alger Simons PT ?11/21/2021, 12:35 PM ?

## 2021-11-21 NOTE — Progress Notes (Signed)
Physical Therapy Session Note ? ?Patient Details  ?Name: Veronica Weber ?MRN: [id number] ?Date of Birth: 03/13/1956 ? ?Today's Date: 11/21/2021 ?PT Individual Time: 1194-1740 ?PT Individual Time Calculation (min): 60 min  ? ?Short Term Goals: ?Week 1:  PT Short Term Goal 1 (Week 1): STG = LTG ? ?Skilled Therapeutic Interventions/Progress Updates:  ?   ?Pt sitting in w/c to start session - agreeable to PT tx and denies pain. Sit<>stand with no AD and supervision from w/c and ambulated downstairs ~232ft to 22M ortho rehab gym with supervision and no AD - standing rest break in the elevator. Pt demonstrating improved safety awareness with gait with less impulsivity and slower movements. Continues to demonstrate L truncal lean and wide BOS in gait which she reports is baseline. Completed UE ergometer + with brief standing rest break, resistance set to 2.5 - forced use of LUE with some difficulty maintaining grasp during UE ergometer task. Completed stair training up/down x12 steps with 2 hand rails and supervision assist, self selected reciprocal pattern. Assisted onto Nustep x8 minutes with BUE/BLE, workload set to 5, achieving 240 steps - similar deficits with UE ergometer where she had difficulty keeping LUE grasp throughout task. Ambulated back upstairs to her room with supervision and no AD, ~322ft. Some min verbal cueing for general safety awareness to slow down activity. Concluded session seated in w/c, safety belt alarm on, all needs within reach.  ? ?Therapy Documentation ?Precautions:  ?Precautions ?Precautions: Fall ?Precaution Comments: left hemichorea ?Restrictions ?Weight Bearing Restrictions: No ?General: ?  ? ?Therapy/Group: Individual Therapy ? ?Yanette Tripoli P Navi Erber ?11/21/2021, 7:48 AM  ?

## 2021-11-22 ENCOUNTER — Encounter: Payer: Self-pay | Admitting: Physical Medicine and Rehabilitation

## 2021-11-22 ENCOUNTER — Telehealth: Payer: Self-pay

## 2021-11-22 MED ORDER — ASPIRIN 325 MG PO TBEC: 325.0000 mg | DELAYED_RELEASE_TABLET | Freq: Every day | ORAL | 0 refills | Status: DC

## 2021-11-22 MED ORDER — ASPIRIN EC 81 MG PO TBEC
81.0000 mg | DELAYED_RELEASE_TABLET | Freq: Every day | ORAL | Status: DC
  Administered 2021-11-22 – 2021-11-23 (×2): 81 mg via ORAL
  Filled 2021-11-22 (×2): qty 1

## 2021-11-22 MED ORDER — ATORVASTATIN CALCIUM 80 MG PO TABS: 80.0000 mg | ORAL_TABLET | Freq: Every day | ORAL | 0 refills | Status: DC

## 2021-11-22 MED ORDER — ASPIRIN 81 MG PO TBEC
81.0000 mg | DELAYED_RELEASE_TABLET | Freq: Every day | ORAL | 11 refills | Status: DC
Start: 2021-11-22 — End: 2022-05-24

## 2021-11-22 MED ORDER — CLOPIDOGREL BISULFATE 75 MG PO TABS: 75.0000 mg | ORAL_TABLET | Freq: Every day | ORAL | 0 refills | Status: DC

## 2021-11-22 MED ORDER — ARIPIPRAZOLE 5 MG PO TABS: 5.0000 mg | ORAL_TABLET | Freq: Every day | ORAL | 0 refills | Status: DC

## 2021-11-22 MED ORDER — MAGNESIUM GLUCONATE 500 MG PO TABS: 250.0000 mg | ORAL_TABLET | Freq: Every day | ORAL | 0 refills | Status: DC

## 2021-11-22 MED ORDER — NICOTINE 21 MG/24HR TD PT24: MEDICATED_PATCH | TRANSDERMAL | 0 refills | Status: DC

## 2021-11-22 MED ORDER — LOSARTAN POTASSIUM 50 MG PO TABS: 50.0000 mg | ORAL_TABLET | Freq: Every day | ORAL | 0 refills | Status: DC

## 2021-11-22 MED ORDER — ACETAMINOPHEN 325 MG PO TABS: 650.0000 mg | ORAL_TABLET | ORAL | Status: DC | PRN

## 2021-11-22 MED ORDER — LIDOCAINE 5 % EX PTCH: 1.0000 | MEDICATED_PATCH | CUTANEOUS | 0 refills | Status: DC

## 2021-11-22 NOTE — IPOC Note (Signed)
Overall Plan of Care (IPOC) ?Patient Details ?Name: Veronica Weber ?MRN: [id number] ?DOB: June 02, 1956 ? 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 ?Hospital Problems: Principal Problem: ?  Right middle cerebral artery stroke (HCC) ? ? ? ? Functional Problem List: ?Nursing Medication Management, Safety, Endurance, Pain  ?PT Balance, Endurance, Motor, Safety, Behavior  ?OT Balance, Behavior, Motor, Safety  ?SLP    ?TR    ?    ? Basic ADL?s: ?OT Grooming, Bathing, Dressing, Toileting  ? ?  Advanced  ADL?s: ?OT Simple Meal Preparation  ?   ?Transfers: ?PT Bed Mobility, Bed to Chair, Car  ?OT Toilet, Tub/Shower  ? ?  Locomotion: ?PT Ambulation, Stairs  ? ?  Additional Impairments: ?OT    ?SLP   ?  ?   ?TR    ? ? ?Anticipated Outcomes ?Item Anticipated Outcome  ?Self Feeding    ?Swallowing ?   ?  ?Basic self-care ? mod I- sup  ?Toileting ? mod I ?  ?Bathroom Transfers mod I  ?Bowel/Bladder ?    ?Transfers ? supervision  ?Locomotion ? supervision  ?Communication ?    ?Cognition ?    ?Pain ? at or below level 4 w prns  ?Safety/Judgment ? maintain safety w cues  ? ?Therapy Plan: ?PT Intensity: Minimum of 1-2 x/day ,45 to 90 minutes ?PT Frequency: 5 out of 7 days ?PT Duration Estimated Length of Stay: 3-5 days ?OT Intensity: Minimum of 1-2 x/day, 45 to 90 minutes ?OT Frequency: 5 out of 7 days ?OT Duration/Estimated Length of Stay: 5-7 days ?   ? ?Due to the current state of emergency, patients may not be receiving their 3-hours of Medicare-mandated therapy. ? ? Team Interventions: ?Nursing Interventions Patient/Family Education, Pain Management, Disease Management/Prevention, Medication Management, Discharge Planning  ?PT interventions Ambulation/gait training, Cognitive remediation/compensation, Discharge planning, DME/adaptive equipment instruction, Functional mobility training, Pain management, Psychosocial support, Splinting/orthotics, Therapeutic Activities, UE/LE Strength taining/ROM,  Visual/perceptual remediation/compensation, Wheelchair propulsion/positioning, UE/LE Coordination activities, Therapeutic Exercise, Stair training, Patient/family education, Functional electrical stimulation, Disease management/prevention, Neuromuscular re-education, Community reintegration, Warden/ranger  ?OT Interventions Balance/vestibular training, Patient/family education, DME/adaptive equipment instruction, Therapeutic Activities, Psychosocial support, Cognitive remediation/compensation, Therapeutic Exercise, Discharge planning, Functional mobility training, Self Care/advanced ADL retraining, UE/LE Strength taining/ROM, Neuromuscular re-education, Disease mangement/prevention, UE/LE Coordination activities, Community reintegration  ?SLP Interventions    ?TR Interventions    ?SW/CM Interventions Discharge Planning, Psychosocial Support, Patient/Family Education  ? ?Barriers to Discharge ?MD  Medical stability  ?Nursing Lack of/limited family support, Home environment access/layout ?1 level 5 ste solo, dtr coming for 3 week visit  ?PT Decreased caregiver support, Lack of/limited family support, Insurance for SNF coverage, Behavior ?   ?OT Lack of/limited family support ?No assist available until this friday when dtr arrives from [place]  ?SLP   ?   ?SW Insurance for SNF coverage ?   ? ?Team Discharge Planning: ?Destination: PT-Home ,OT- Home , SLP-  ?Projected Follow-up: PT-Home health PT, 24 hour supervision/assistance, OT-  Home health OT, Outpatient OT, SLP-  ?Projected Equipment Needs: PT-To be determined, OT- To be determined, SLP-  ?Equipment Details: PT- , OT-has a quad cane ?Patient/family involved in discharge planning: PT- Patient,  OT-Patient, SLP-  ? ?MD ELOS: 4 days ?Medical Rehab Prognosis:  Excellent ?Assessment: The patient has been admitted for CIR therapies with the diagnosis of right MCA stroke.The team will be addressing functional mobility, strength, stamina, balance, safety,  adaptive techniques and equipment, self-care, bowel and bladder mgt, patient and caregiver  education. Goals have been set at modI/S. Anticipated discharge destination is home. ? ?See Team Conference Notes for weekly updates to the plan of care  ?

## 2021-11-22 NOTE — Progress Notes (Signed)
?                                                       PROGRESS NOTE ? ? ?Subjective/Complaints: ?No new complaints this morning ?Feels ready for discharge tomorrow ?She is excited to see her dogs ?She asks about the kind of stroke she had.  ? ?ROS: L>R knee pain, +insomnia ? ?Objective: ?  ?No results found. ?Recent Labs  ?  11/20/21 ?0624  ?WBC 6.0  ?HGB 13.3  ?HCT 39.8  ?PLT 270  ? ?Recent Labs  ?  11/20/21 ?0624  ?NA 136  ?K 3.8  ?CL 103  ?CO2 27  ?GLUCOSE 100*  ?BUN 12  ?CREATININE 0.77  ?CALCIUM 9.7  ? ? ?Intake/Output Summary (Last 24 hours) at 11/22/2021 1147 ?Last data filed at 11/22/2021 0700 ?Gross per 24 hour  ?Intake 778 ml  ?Output --  ?Net 778 ml  ?  ? ?  ? ?Physical Exam: ?Vital Signs ?Blood pressure 117/64, pulse 79, temperature 98 ?F (36.7 ?C), temperature source Oral, resp. rate 18, height 5' (1.524 m), weight 93 kg, SpO2 97 %. ? ?Gen: no distress, normal appearing, BMI 40.04 ?HEENT: oral mucosa pink and moist, NCAT ?Cardio: Reg rate ?Chest: normal effort, normal rate of breathing ?Abd: soft, non-distended ?Ext: no edema ?Psych: pleasant, normal affect ?Musculoskeletal:  ?   Cervical back: Neck supple. No tenderness.  ?   Comments: 5-/5 throughout in all 4 extremities. Left knee TTP ?Skin: ?   General: Skin is warm and dry.  ?   Comments: L forarm-inner- straight line with curve burn from oven at home- red ?L AC fossa bruising and R upper inner arm purple bruising  ?Neurological:  ?   Mental Status: She is alert and oriented to person, place, and time.  ?   Comments: Patient is alert.  Oriented x3.  No acute distress.  Follows full commands. 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   Mood and Affect: Mood normal.     ?   Behavior: Behavior normal.     ?   Thought Content: Thought content normal. ? ?Assessment/Plan: ?1. Functional deficits which require 3+ hours per day of interdisciplinary therapy in a comprehensive inpatient rehab  setting. ?Physiatrist is providing close team supervision and 24 hour management of active medical problems listed below. ?Physiatrist and rehab team continue to assess barriers to discharge/monitor patient progress toward functional and medical goals ? ?Care Tool: ? ?Bathing ?   ?   ?   ?  ?  ?Bathing assist Assist Level: Contact Guard/Touching assist ?  ?  ?Upper Body Dressing/Undressing ?Upper body dressing   ?What is the patient wearing?: Pull over shirt ?   ?Upper body assist Assist Level: Set up assist ?   ?Lower Body Dressing/Undressing ?Lower body dressing ? ? ?   ?  ? ?  ? ?Lower body assist Assist for lower body dressing: Contact Guard/Touching assist ?   ? ?Toileting ?Toileting    ?Toileting assist Assist for toileting: Contact Guard/Touching assist ?  ?  ?Transfers ?Chair/bed transfer ? ?Transfers assist ?   ? ?Chair/bed transfer assist level: Contact Guard/Touching assist ?  ?  ?Locomotion ?Ambulation ? ? ?Ambulation assist ? ?   ? ?  Assist level: Contact Guard/Touching assist ?Assistive device: No Device ?Max distance: 161ft  ? ?Walk 10 feet activity ? ? ?Assist ?   ? ?Assist level: Contact Guard/Touching assist ?Assistive device: No Device  ? ?Walk 50 feet activity ? ? ?Assist   ? ?Assist level: Contact Guard/Touching assist ?Assistive device: No Device  ? ? ?Walk 150 feet activity ? ? ?Assist   ? ?Assist level: Contact Guard/Touching assist ?Assistive device: No Device ?  ? ?Walk 10 feet on uneven surface  ?activity ? ? ?Assist   ? ? ?Assist level: Minimal Assistance - Patient > 75% ?   ? ?Wheelchair ? ? ? ? ?Assist Is the patient using a wheelchair?: No ?  ?  ? ?  ?   ? ? ?Wheelchair 50 feet with 2 turns activity ? ? ? ?Assist ? ?  ?  ? ? ?   ? ?Wheelchair 150 feet activity  ? ? ? ?Assist ?   ? ? ?   ? ?Blood pressure 117/64, pulse 79, temperature 98 ?F (36.7 ?C), temperature source Oral, resp. rate 18, height 5' (1.524 m), weight 93 kg, SpO2 97 %. ? ?Medical Problem List and Plan: ?1. Functional  deficits secondary to patchy and scattered small acute infarcts right MCA territory primary in the posterior right MCA division with some MCA/PCA watershed involvement ?            -patient may  shower ?            -ELOS/Goals: 4 days mod I/S ? Continue CIR ? D/c home with daughter tomorrow ? HFU scheduled ?2.  Impaired mobility, ambulating 300 feet: d/c lovenox. ?            -antiplatelet therapy: continue Aspirin 325 mg daily and Plavix 75 mg day x3 months then aspirin alone ?3. Left knee pain: continue lidocaine patch ordered Tylenol as needed ?4. Mood: Provide emotional support ?            -antipsychotic agents: Abilify 5 mg daily ?5. Neuropsych: This patient is capable of making decisions on her own behalf. ?6. Skin/Wound Care: Routine skin checks ?7. Fluids/Electrolytes/Nutrition: Routine in and outs with follow-up chemistries ?8.  Hyperlipidemia. LDL 120,continue Lipitor ?9.  History of tobacco abuse.  continue NicoDerm patch 21mg . Provide counseling. Discussed Chantix outpatient.  ?10.  Permissive hypertension.  Well controlled, continue chlorthalidone 25 mg daily and Cozaar 50 mg daily prior to admission.  Resume as needed. Add magnesium 250mg  HS ?11. Dizziness with stairs: recommended drinking 6-8 glasses of water per day ? ?LOS: ?3 days ?A FACE TO FACE EVALUATION WAS PERFORMED ? ? P Mattison Stuckey ?11/22/2021, 11:47 AM  ? ?  ?

## 2021-11-22 NOTE — Telephone Encounter (Signed)
Prior Authorization for Lidocaine Patches has been submitted to [facility].  ?

## 2021-11-22 NOTE — Progress Notes (Signed)
Physical Therapy Session Note ? ?Patient Details  ?Name: Veronica Weber ?MRN: [id number] ?Date of Birth: 1955-10-29 ? ?Today's Date: 11/22/2021 ?PT Group Time: 0211-1735 ?PT Group Time Calculation (min): 60 min ? ?Short Term Goals: ?Week 1:  PT Short Term Goal 1 (Week 1): STG = LTG ? ?Skilled Therapeutic Interventions/Progress Updates: Pt participated in therapeutic intervention via group setting. Pt participated in therapeutic activities including seated LE therex, ambulation on obstacle course including 6in step, stepping over thresholds, ambulation on compliant surface, and sidestepping. Group also participated in discussions/education of energy conservation, fall recovery, and safety in home upon d/c. Pt required some reinforcement in safety but was overall supervision with mobility. Session concluded with pt pleased with activities and current needs met.  ?   ? ?Therapy Documentation ?Precautions:  ?Precautions ?Precautions: Fall ?Precaution Comments: left hemichorea (mild) ?Restrictions ?Weight Bearing Restrictions: No ?General: ?  ?Vital Signs: ?Therapy Vitals ?Temp: 98.4 ?F (36.9 ?C) ?Temp Source: Oral ?Pulse Rate: 85 ?Resp: 17 ?BP: 111/66 ?Patient Position (if appropriate): Lying ?Oxygen Therapy ?SpO2: 96 % ?O2 Device: Room Air ?Pain: ?Pain Assessment ?Pain Scale: 0-10 ?Pain Score: 4  ?Faces Pain Scale: Hurts little more ?Pain Type: Chronic pain ?Pain Location: Knee ?Pain Orientation: Left ?Pain Descriptors / Indicators: Aching ?Pain Onset: With Activity ?Pain Intervention(s): Repositioned;Rest ? ?Exercises: ?  ?Other Treatments:   ? ? ? ?Therapy/Group: Group Therapy ? ?Justyn Langham ?11/22/2021, 4:35 PM  ?

## 2021-11-22 NOTE — Progress Notes (Addendum)
Occupational Therapy Discharge Summary ? ?Patient Details  ?Name: Veronica Weber ?MRN: [id number] ?Date of Birth: Mar 29, 1956 ? ?Today's Date: 11/22/2021 ?OT Individual Time: 1856-3149 ?OT Individual Time Calculation (min): 30 min  ? ? ?Patient has met 8 of 8 long term goals due to improved activity tolerance, improved balance, improved awareness, and improved coordination.  Patient to discharge at overall Independent -supervision level. Pt needs supervision for shower transfer and IADLs to cue her for safety and to slow pacing of movement to allow for increased reaction time due to slightly unsteady during dynamic standing and ambulation secondary to pain in knee and ankles.  She appears to be at her baseline functionally and cognitively. Patient's care partner, her daughter, is independent to provide the necessary cognitive assistance at discharge.   ? ?Reasons goals not met: na ? ?Recommendation:  ?Patient will benefit from ongoing skilled OT services in home health setting to continue to advance functional skills in the area of BADL, safety awareness, environmental assessment and recommendations. ? ?Equipment: ?BSC ? ?Reasons for discharge: treatment goals met and discharge from hospital ? ?Patient/family agrees with progress made and goals achieved: Yes ? ?OT Discharge ?Precautions/Restrictions  ?Precautions ?Precautions: Fall ?Precaution Comments: left hemichorea (mild) ?Restrictions ?Weight Bearing Restrictions: No ?Pain ?Pain Assessment ?Pain Scale: Faces ?Faces Pain Scale: Hurts little more ?Pain Type: Acute pain ?Pain Location: Ankle ?Pain Orientation: Right;Left ?Pain Descriptors / Indicators: Aching ?Pain Onset: With Activity ?Pain Intervention(s): Repositioned;Rest ?ADL ?ADL ?Eating: Independent ?Where Assessed-Eating: Wheelchair ?Grooming: Independent ?Where Assessed-Grooming: Standing at sink ?Upper Body Bathing: Independent ?Where Assessed-Upper Body Bathing: Shower ?Lower Body Bathing: Modified  independent ?Where Assessed-Lower Body Bathing: Shower ?Upper Body Dressing: Independent ?Where Assessed-Upper Body Dressing: Edge of bed ?Lower Body Dressing: Independent ?Where Assessed-Lower Body Dressing: Edge of bed ?Toileting: Independent ?Where Assessed-Toileting: Toilet ?Toilet Transfer: Independent ?Toilet Transfer Method: Ambulating ?Walk-In Shower Transfer: Distant supervision ?Walk-In Shower Transfer Method: Ambulating ?Walk-In Shower Equipment: Civil engineer, contracting with back ?Vision ?Baseline Vision/History: 1 Wears glasses ?Patient Visual Report: No change from baseline ?Vision Assessment?: No apparent visual deficits ?Perception  ?Perception: Within Functional Limits ?Praxis ?Praxis: Intact ?Cognition ?Cognition ?Overall Cognitive Status: Within Functional Limits for tasks assessed ?Arousal/Alertness: Awake/alert ?Orientation Level: Person;Place;Situation ?Person: Oriented ?Place: Oriented ?Situation: Oriented ?Memory: Appears intact ?Focused Attention: Appears intact ?Sustained Attention: Impaired (appears at baseline however) ?Awareness: Appears intact ?Problem Solving: Appears intact ?Reasoning: Appears intact ?Behaviors: Restless (baseline? mild) ?Safety/Judgment: Impaired ?Brief Interview for Mental Status (BIMS) ?Repetition of Three Words (First Attempt): 3 ?Temporal Orientation: Year: Correct ?Temporal Orientation: Month: Accurate within 5 days ?Temporal Orientation: Day: Correct ?Recall: "Sock": Yes, no cue required ?Recall: "Blue": Yes, no cue required ?Recall: "Bed": Yes, no cue required ?BIMS Summary Score: 15 ?Sensation ?Sensation ?Light Touch: Appears Intact ?Hot/Cold: Appears Intact ?Proprioception: Appears Intact ?Stereognosis: Appears Intact ?Coordination ?Gross Motor Movements are Fluid and Coordinated: No ?Fine Motor Movements are Fluid and Coordinated: Yes ?Finger Nose Finger Test: intact BUE ?Motor  ?Motor ?Motor: Hemiplegia ?Motor - Discharge Observations: mild LLE; LUE WFL ?Mobility   ?Transfers ?Sit to Stand: Independent ?Stand to Sit: Independent  ?Trunk/Postural Assessment  ?Cervical Assessment ?Cervical Assessment: Within Functional Limits ?Thoracic Assessment ?Thoracic Assessment: Within Functional Limits ?Lumbar Assessment ?Lumbar Assessment: Within Functional Limits ?Postural Control ?Postural Control: Within Functional Limits  ?Balance ?Balance ?Balance Assessed: Yes ?Static Sitting Balance ?Static Sitting - Balance Support: Feet unsupported;No upper extremity supported ?Static Sitting - Level of Assistance: 7: Independent ?Dynamic Sitting Balance ?Dynamic Sitting - Balance Support: During functional activity;Feet supported ?Dynamic Sitting -  Level of Assistance: 7: Independent ?Static Standing Balance ?Static Standing - Balance Support: No upper extremity supported ?Static Standing - Level of Assistance: 7: Independent ?Dynamic Standing Balance ?Dynamic Standing - Balance Support: During functional activity;No upper extremity supported ?Dynamic Standing - Level of Assistance: 7: Independent ?Extremity/Trunk Assessment ?RUE Assessment ?RUE Assessment: Within Functional Limits ?LUE Assessment ?LUE Assessment: Within Functional Limits ? ? ?Veronica Weber ?11/22/2021, 2:47 PM ?

## 2021-11-22 NOTE — Progress Notes (Signed)
Physical Therapy Discharge Summary ? ?Patient Details  ?Name: Veronica Weber ?MRN: [id number] ?Date of Birth: 05/25/1956 ? ?Today's Date: 11/22/2021 ?PT Individual Time: 2536-6440 ?PT Individual Time Calculation (min): 58 min  ? ? ?Patient has met 7 of 7 long term goals due to improved balance, increased strength, and ability to compensate for deficits.  Patient to discharge at an ambulatory level Supervision.   Patient's care partner is independent to provide the necessary physical assistance at discharge. ? ?Reasons goals not met: All goals met at supervision to mod I level w/ and w/o AD.  Pt somewhat impulsive and tends to move too quickly.  Educated on need for slowed movements especially in light of statements from pt " my Left knee feels like it wants to give out.  Pt to be D/C d to home w/ family assist. ? ?Recommendation:  ?Patient will benefit from ongoing skilled PT services in home health setting to continue to advance safe functional mobility, address ongoing impairments in balance and safety, and minimize fall risk. ? ?Equipment: ?No equipment provided ? ?Reasons for discharge: treatment goals met and discharge from hospital ? ?Patient/family agrees with progress made and goals achieved: Yes ?Pt presents sidelying in bed and agreeable to therapy.  Pt wheeled to elevators and down to gift shop.  Pt negotiated RW into and throughout gift shop w/ supervision although requires verbal cues for safety.  Pt amb w/o AD and supervision carrying gift purchases.  Pt picked wallet from floor multiple trials during session as states " I can't really carry anything".  Pt amb to outdoor surfaces w/ supervision and c/o pain w/ L knee 2/2 arthritis.  Pt amb on uneven surfaces w/o LOB.  Pt wheeled into hospital and to main gym.  Pt negotiated 12 steps w/ B rails and supervision, step through gait pattern at first, but then performed step-to pattern descending steps.  Pt returned to room and transferred w/c to bed  w/ supervision and then I for bed mobility.  Bed alarm on and all needs in reach. ? ? ? ?PT Discharge ?Precautions/Restrictions ?Precautions ?Precautions: Fall ?Precaution Comments: left hemichorea (mild) ?Restrictions ?Weight Bearing Restrictions: No ?Vital Signs ?Therapy Vitals ?Temp: 98.4 ?F (36.9 ?C) ?Temp Source: Oral ?Pulse Rate: 85 ?Resp: 17 ?BP: 111/66 ?Patient Position (if appropriate): Lying ?Oxygen Therapy ?SpO2: 96 % ?O2 Device: Room Air ?Pain ?Pain Assessment ?Pain Scale: 0-10 ?Pain Score: 4  ?Faces Pain Scale: Hurts little more ?Pain Type: Chronic pain ?Pain Location: Knee ?Pain Orientation: Left ?Pain Descriptors / Indicators: Aching ?Pain Onset: With Activity ?Pain Intervention(s): Repositioned;Rest ?Pain Interference ?Pain Interference ?Pain Effect on Sleep: 4. Almost constantly;3. Frequently ?Pain Interference with Therapy Activities: 2. Occasionally ?Pain Interference with Day-to-Day Activities: 2. Occasionally ?Vision/Perception  ?Vision - History ?Ability to See in Adequate Light: 0 Adequate ?Perception ?Perception: Within Functional Limits ?Praxis ?Praxis: Intact  ?Cognition ?Overall Cognitive Status: Within Functional Limits for tasks assessed ?Arousal/Alertness: Awake/alert ?Orientation Level: Oriented X4 ?Focused Attention: Appears intact ?Sustained Attention: Impaired (appears at baseline however) ?Memory: Appears intact ?Awareness: Appears intact ?Problem Solving: Appears intact ?Reasoning: Appears intact ?Behaviors: Restless (baseline? mild) ?Safety/Judgment: Impaired ?Comments: questionable safety/judgement during complex functional tasks. Very quick to move and quick decision making ?Sensation ?Sensation ?Light Touch: Appears Intact ?Hot/Cold: Appears Intact ?Proprioception: Appears Intact ?Stereognosis: Appears Intact ?Coordination ?Gross Motor Movements are Fluid and Coordinated: No ?Fine Motor Movements are Fluid and Coordinated: Yes ?Finger Nose Finger Test: intact BUE ?Motor   ?Motor ?Motor: Hemiplegia ?Motor - Discharge Observations: mild  LLE; LUE WFL  ?Mobility ?Bed Mobility ?Bed Mobility: Supine to Sit;Sit to Supine ?Supine to Sit: Independent ?Sit to Supine: Independent ?Transfers ?Transfers: Sit to Stand;Stand Pivot Transfers;Stand to Sit ?Sit to Stand: Supervision/Verbal cueing ?Stand to Sit: Supervision/Verbal cueing ?Stand Pivot Transfers: Supervision/Verbal cueing ?Stand Pivot Transfer Details (indicate cue type and reason): pt impulsive. ?Transfer (Assistive device): None ?Locomotion  ?Gait ?Ambulation: Yes ?Gait Assistance: Supervision/Verbal cueing ?Gait Distance (Feet): 300 Feet ?Assistive device: None (RW also used, but does not negotiate well.) ?Gait Assistance Details: Verbal cues for precautions/safety ?Gait ?Gait: Yes ?Gait Pattern: Step-through pattern;Wide base of support ?Stairs / Additional Locomotion ?Stairs: Yes ?Stairs Assistance: Supervision/Verbal cueing ?Stair Management Technique: Two rails ?Number of Stairs: 12 ?Height of Stairs: 6 ?Pick up small object from the floor assist level: Supervision/Verbal cueing ?Pick up small object from the floor assistive device: no AD ?Wheelchair Mobility ?Wheelchair Mobility: No  ?Trunk/Postural Assessment  ?Cervical Assessment ?Cervical Assessment: Within Functional Limits ?Thoracic Assessment ?Thoracic Assessment: Within Functional Limits ?Lumbar Assessment ?Lumbar Assessment: Within Functional Limits ?Postural Control ?Postural Control: Within Functional Limits  ?Balance ?Balance ?Balance Assessed: Yes ?Static Sitting Balance ?Static Sitting - Balance Support: Feet unsupported;No upper extremity supported ?Static Sitting - Level of Assistance: 7: Independent ?Dynamic Sitting Balance ?Dynamic Sitting - Balance Support: During functional activity;Feet supported ?Dynamic Sitting - Level of Assistance: 7: Independent ?Static Standing Balance ?Static Standing - Balance Support: No upper extremity supported ?Static Standing -  Level of Assistance: 7: Independent ?Dynamic Standing Balance ?Dynamic Standing - Balance Support: During functional activity;No upper extremity supported ?Dynamic Standing - Level of Assistance: 7: Independent ?Extremity Assessment  ?RUE Assessment ?RUE Assessment: Within Functional Limits ?LUE Assessment ?LUE Assessment: Within Functional Limits ?RLE Assessment ?RLE Assessment: Within Functional Limits ?LLE Assessment ?LLE Assessment: Exceptions to [facility] ?General Strength Comments: Grossly 4+/5 ? ? ? ?Ladoris Gene ?11/22/2021, 4:29 PM ?

## 2021-11-23 DIAGNOSIS — I10 Essential (primary) hypertension: Secondary | ICD-10-CM

## 2021-11-23 NOTE — Progress Notes (Signed)
?                                                       PROGRESS NOTE ? ? ?Subjective/Complaints: ?No new complaints. Ready for her daughter to come pick her up ? ?ROS: Patient denies fever, rash, sore throat, blurred vision, dizziness, nausea, vomiting, diarrhea, cough, shortness of breath or chest pain, joint or back/neck pain, headache, or mood change.  ? ?Objective: ?  ?No results found. ?No results for input(s): WBC, HGB, HCT, PLT in the last 72 hours. ? ?No results for input(s): NA, K, CL, CO2, GLUCOSE, BUN, CREATININE, CALCIUM in the last 72 hours. ? ? ?Intake/Output Summary (Last 24 hours) at 11/23/2021 0857 ?Last data filed at 11/22/2021 2040 ?Gross per 24 hour  ?Intake 240 ml  ?Output --  ?Net 240 ml  ?  ? ?  ? ?Physical Exam: ?Vital Signs ?Blood pressure 133/76, pulse 76, temperature 98.4 ?F (36.9 ?C), temperature source Oral, resp. rate 18, height 5' (1.524 m), weight 93 kg, SpO2 97 %. ? ?Gen: no distress, normal appearing, BMI 40.04 ?Constitutional: No distress . Vital signs reviewed. ?HEENT: NCAT, EOMI, oral membranes moist ?Neck: supple ?Cardiovascular: RRR without murmur. No JVD    ?Respiratory/Chest: CTA Bilaterally without wheezes or rales. Normal effort    ?GI/Abdomen: BS +, non-tender, non-distended ?Ext: no clubbing, cyanosis, or edema ?Psych: pleasant and cooperative  ?Musculoskeletal:  ?   Cervical back: Neck supple. No tenderness.  ?   Comments: 5-/5 throughout in all 4 extremities. Left knee TTP ?Skin: ?   General: Skin is warm and dry.  ?     ?Neurological:  ?   Mental Status: She is alert and oriented to person, place, and time.  ?   Comments: Patient is alert.  Oriented x3.  No acute distress.  Follows full commands. ?L hemi chorea noted- +/- cannot stop moving-  ?Intact to light touch in all 4 extremities  ? ? ?Assessment/Plan: ?1. Functional deficits which require 3+ hours per day of interdisciplinary therapy in a comprehensive inpatient rehab setting. ?Physiatrist is providing close  team supervision and 24 hour management of active medical problems listed below. ?Physiatrist and rehab team continue to assess barriers to discharge/monitor patient progress toward functional and medical goals ? ?Care Tool: ? ?Bathing ?   ?   ?   ?  ?  ?Bathing assist Assist Level: Supervision/Verbal cueing ?  ?  ?Upper Body Dressing/Undressing ?Upper body dressing   ?What is the patient wearing?: Pull over shirt ?   ?Upper body assist Assist Level: Independent ?   ?Lower Body Dressing/Undressing ?Lower body dressing ? ? ?   ?  ? ?  ? ?Lower body assist Assist for lower body dressing: Independent ?   ? ?Toileting ?Toileting    ?Toileting assist Assist for toileting: Independent ?  ?  ?Transfers ?Chair/bed transfer ? ?Transfers assist ?   ? ?Chair/bed transfer assist level: Supervision/Verbal cueing ?  ?  ?Locomotion ?Ambulation ? ? ?Ambulation assist ? ?   ? ?Assist level: Supervision/Verbal cueing ?Assistive device: No Device ?Max distance: 300  ? ?Walk 10 feet activity ? ? ?Assist ?   ? ?Assist level: Supervision/Verbal cueing ?Assistive device: No Device  ? ?Walk 50 feet activity ? ? ?Assist   ? ?Assist level: Supervision/Verbal cueing ?Assistive device:  No Device  ? ? ?Walk 150 feet activity ? ? ?Assist   ? ?Assist level: Supervision/Verbal cueing ?Assistive device: No Device ?  ? ?Walk 10 feet on uneven surface  ?activity ? ? ?Assist   ? ? ?Assist level: Supervision/Verbal cueing ?Assistive device: Other (comment) (no device.)  ? ?Wheelchair ? ? ? ? ?Assist Is the patient using a wheelchair?: No ?  ?  ? ?  ?   ? ? ?Wheelchair 50 feet with 2 turns activity ? ? ? ?Assist ? ?  ?  ? ? ?   ? ?Wheelchair 150 feet activity  ? ? ? ?Assist ?   ? ? ?   ? ?Blood pressure 133/76, pulse 76, temperature 98.4 ?F (36.9 ?C), temperature source Oral, resp. rate 18, height 5' (1.524 m), weight 93 kg, SpO2 97 %. ? ?Medical Problem List and Plan: ?1. Functional deficits secondary to patchy and scattered small acute infarcts right  MCA territory primary in the posterior right MCA division with some MCA/PCA watershed involvement ?            -patient may  shower ?            DC home today, goals met ? HFU scheduled ?2.  Impaired mobility, ambulating 300 feet: d/c lovenox. ?            -antiplatelet therapy: continue Aspirin 325 mg daily and Plavix 75 mg day x3 months then aspirin alone ?3. Left knee pain: continue lidocaine patch ordered Tylenol as needed ?4. Mood: Provide emotional support ?            -antipsychotic agents: Abilify 5 mg daily ?5. Neuropsych: This patient is capable of making decisions on her own behalf. ?6. Skin/Wound Care: Routine skin checks ?7. Fluids/Electrolytes/Nutrition: Routine in and outs with follow-up chemistries ?8.  Hyperlipidemia. LDL 120,continue Lipitor ?9.  History of tobacco abuse.  continue NicoDerm patch 32m. Provide counseling. Discussed Chantix outpatient.  ?10.  Permissive hypertension.    continue chlorthalidone 25 mg daily and Cozaar 50 mg daily prior to admission.  Resume as needed. Added magnesium 2532mHS ? -well controlled ?11. Dizziness with stairs: recommended drinking 6-8 glasses of water per day ? ?LOS: ?4 days ?A FACE TO FACE EVALUATION WAS PERFORMED ? ?ZaMeredith Staggers3/25/2023, 8:57 AM  ? ?  ?

## 2021-11-23 NOTE — Progress Notes (Signed)
INPATIENT REHABILITATION DISCHARGE NOTE ? ? ?Discharge instructions by: PA ? ?Verbalized understanding: yes ? ?Skin care/Wound care healing? Skin intact ? ?Pain: none ? ?IV's: removed  ? ?Tubes/Drains: none  ? ?O2: room air  ? ?Safety instructions: given  ? ?Patient belongings: sent with daughter ? ?Discharged to: home ? ?Discharged via:Wheelchair  ? ?Notes: ? ? ? ? ? ? ?  ?

## 2021-11-25 ENCOUNTER — Other Ambulatory Visit: Payer: Self-pay

## 2021-11-25 ENCOUNTER — Encounter: Payer: Self-pay | Admitting: Physical Medicine and Rehabilitation

## 2021-11-25 ENCOUNTER — Ambulatory Visit (INDEPENDENT_AMBULATORY_CARE_PROVIDER_SITE_OTHER): Payer: Medicare Other | Admitting: Surgical

## 2021-11-25 DIAGNOSIS — M545 Low back pain, unspecified: Secondary | ICD-10-CM | POA: Diagnosis not present

## 2021-11-25 DIAGNOSIS — M170 Bilateral primary osteoarthritis of knee: Secondary | ICD-10-CM | POA: Diagnosis not present

## 2021-11-26 ENCOUNTER — Other Ambulatory Visit (HOSPITAL_COMMUNITY): Payer: Self-pay

## 2021-11-26 ENCOUNTER — Telehealth (HOSPITAL_COMMUNITY): Payer: Self-pay | Admitting: Pharmacist

## 2021-11-26 ENCOUNTER — Other Ambulatory Visit: Payer: Self-pay

## 2021-11-26 ENCOUNTER — Other Ambulatory Visit: Payer: Self-pay | Admitting: Surgical

## 2021-11-26 MED ORDER — LIDOCAINE 5 % EX PTCH: 1.0000 | MEDICATED_PATCH | CUTANEOUS | 0 refills | Status: DC

## 2021-11-26 NOTE — Telephone Encounter (Signed)
Status: PA Response - Approved ? ?Created: March 24th, 2023 ? ?Sent: March 24th, 2023 ?

## 2021-11-26 NOTE — Telephone Encounter (Signed)
Pharmacy Transitions of Care Follow-up Telephone Call ? ?Date of discharge: 11/23/21  ?Discharge Diagnosis: Stroke, Pt d/c from rehab ? ?Spoke with patient as well as her daughter who helps with her medications.  ? ?How have you been since you were released from the hospital? Pt doing well but still has some bruising from her hospitalization  ? ?Medication changes made at discharge: ? - START: Clopidogrel, ASA ? - STOPPED: ASA 325 and start 81mg  ? - CHANGED: n/a ? ?Also reviewed other meds including: ?Nicotine patch ?Atorvastatin ?ASA ?Losartan ?Aripiprazole ?Lidocaine Patches ?Magnesium ? ?Patient had questions about indications and use ? ?Patient continues using nicotine patches with success, has not smoked.  Is very pleased with her progress. ? ?Medication changes verified by the patient? Yes ?  ? ?Medication Accessibility: ? ?Home Pharmacy: [facility] [place]  ? ?Was the patient provided with refills on discharged medications? Yes, sent to [facility] (discharged on a Saturday)  ? ?Have all prescriptions been transferred from [facility] to home pharmacy? N/a  ? ?Is the patient able to afford medications? yes ?Notable copays: n/a ?Eligible patient assistance: n/a ?  ? ?Medication Review: ? ?CLOPIDOGREL (PLAVIX) ?Clopidogrel 75 mg once daily.  ?- Educated patient on expected duration of therapy of 3 weeks total with clopidogrel. Advised patient that aspirin 81mg  will be continued ?- Reviewed potential DDIs with patient  ?- Advised patient of medications to avoid (NSAIDs, ASA)  ?- Educated that Tylenol (acetaminophen) will be the preferred analgesic to prevent risk of bleeding  ?- Emphasized importance of monitoring for signs and symptoms of bleeding (abnormal bruising, prolonged bleeding, nose bleeds, bleeding from gums, discolored urine, black tarry stools)  ?- Advised patient to alert all providers of anticoagulation therapy prior to starting a new medication or having a procedure  ? ?Patient reports compliance and  denies adverse events or bleeding/bruising.  ? ? ?Follow-up Appointments: ? ?PCP Hospital f/u appt confirmed? [facility] yesterday, 11/25/21. Will follow-up again in May 2023.  ? ?Specialist Hospital f/u appt confirmed?  None ? ?If their condition worsens, is the pt aware to call PCP or go to the Emergency Dept.? yes ? ?Final Patient Assessment: ?Patient is doing well, now has a good understanding of her medications, and is aware of upcoming appointments.  ? ?

## 2021-11-27 ENCOUNTER — Telehealth: Payer: Self-pay | Admitting: Physician Assistant

## 2021-11-27 NOTE — Telephone Encounter (Signed)
Veronica Weber with Veronica Weber called to state she had some concerns with patient's meds. She was prescribed Plavix but only 10 pills. Is she needing another refill? Also, she is taking Chlorthalidone(?) Veronica Weber is wanting to know if she can still be taking both of these while also taking the Lasartan. You can call her at [phone] ?

## 2021-11-28 ENCOUNTER — Encounter: Payer: Self-pay | Admitting: Orthopedic Surgery

## 2021-11-28 ENCOUNTER — Other Ambulatory Visit: Payer: Self-pay

## 2021-11-28 MED ORDER — CHLORTHALIDONE 25 MG PO TABS: 25.0000 mg | ORAL_TABLET | Freq: Every day | ORAL | 0 refills | Status: DC

## 2021-11-28 MED ORDER — CLOPIDOGREL BISULFATE 75 MG PO TABS: 75.0000 mg | ORAL_TABLET | Freq: Every day | ORAL | 0 refills | Status: DC

## 2021-11-28 NOTE — Telephone Encounter (Signed)
Medication Clarified.

## 2021-11-28 NOTE — Progress Notes (Signed)
? ?Office Visit Note ?  ?Patient: Veronica Weber           ?Date of Birth: Jan 22, 1956           ?MRN: [id number] ?Visit Date: 11/25/2021 ?Requested by: Allwardt, Crist Infante, PA-C ?[address] ?[place],  [place] [address] ?PCP: Allwardt, Crist Infante, PA-C ? ?Subjective: ?Chief Complaint  ?Patient presents with  ? Other  ?  Scan review  ? ? ?HPI: Veronica Weber is a 66 y.o. female who presents to the office for MRI review. Patient denies any changes in symptoms.  Continues to complain mainly of bilateral knee pain with increased low back pain.  She did sustain a stroke on 11/16/2021 and is now on aspirin and Plavix.  She is also taking Abilify.  She has begun the process of quitting smoking with her last smoke 1 week ago.  Currently using lidocaine patches for symptomatic relief of her knee pain. ? ?MRI results revealed: ?MR BRAIN WO CONTRAST ? ?Result Date: 11/17/2021 ?CLINICAL DATA:  66 year old female with acute neurologic deficit. EXAM: MRI HEAD WITHOUT CONTRAST TECHNIQUE: Multiplanar, multiecho pulse sequences of the brain and surrounding structures were obtained without intravenous contrast. COMPARISON:  Head CT 11/16/2021. FINDINGS: Brain: Multifocal scattered, small cortical and white matter infarcts in the posterior right MCA territory, with involvement of the postcentral gyrus and other posterior parietal lobe structures. Mild associated right periatrial white matter involvement. Solitary small acute cortical infarct in the anterior right frontal lobe on series 5, image 79. And a several small superior right occipital lobe and a right lateral temporal lobe foci on series 5, image 70. No contralateral left hemisphere or posterior fossa involvement. Mild T2 and FLAIR hyperintensity in the affected areas with no associated hemorrhage or mass effect. No superimposed midline shift, mass effect, evidence of mass lesion, ventriculomegaly, extra-axial collection or acute intracranial hemorrhage.  Cervicomedullary junction and pituitary are within normal limits. Extensive bilateral cerebral white matter T2 and FLAIR hyperintensity with chronic cortical encephalomalacia in the left parietal lobe, posterior left MCA territory. Posterior deep white matter capsules are affected. But the deep gray matter nuclei are relatively spared. Mild to moderate T2 heterogeneity in the pons. Negative cerebellum. No chronic cerebral blood products identified. Vascular: Major intracranial vascular flow voids are preserved. Skull and upper cervical spine: Negative for age visible cervical spine. Visualized bone marrow signal is within normal limits. Sinuses/Orbits: Postoperative changes to both globes. Paranasal Visualized paranasal sinuses and mastoids are stable and well aerated. Other: Visible internal auditory structures appear normal. Negative visible scalp and face. IMPRESSION: 1. Patchy and scattered small acute infarcts in the Right MCA territory, primarily the posterior right MCA division with some MCA/PCA watershed involved. No associated hemorrhage or mass effect. 2. Chronic contralateral posterior left MCA territory infarct, and severe bilateral cerebral white matter disease. Electronically Signed   By: Odessa Fleming M.D.   On: 11/17/2021 04:34  ? ?MR Lumbar Spine w/o contrast ? ?Result Date: 11/06/2021 ?CLINICAL DATA:  Chronic low back pain, evaluate for compression fracture after fall EXAM: MRI LUMBAR SPINE WITHOUT CONTRAST TECHNIQUE: Multiplanar, multisequence MR imaging of the lumbar spine was performed. No intravenous contrast was administered. COMPARISON:  Lumbar spine radiographs 10/07/2021 CT chest 10/02/2021 FINDINGS: Segmentation: 12 rib-bearing thoracic vertebral bodies are seen on the prior CT chest. When correlated with the CT, there is transitional anatomy with sacralization of the L5 vertebral body and a rudimentary L5-S1 disc space. For the purposes of this report, the lowest rudimentary  disc space is  designated L5-S1. Alignment:  Normal. Vertebrae: There is mild anterior wedge deformity of the T11 vertebral body with indentation of the superior endplate by a prominent Schmorl's node. There is no marrow edema. There is mild compression deformity of the L1 vertebral body with trace marrow edema. This was present on the CT chest from 10/02/2021. The other vertebral body heights are preserved. There is no suspicious marrow signal abnormality. There is mild degenerative endplate marrow signal abnormality at T9-T10 with faint edema. There is no other marrow edema. Conus medullaris and cauda equina: Conus extends to the L1 level. Conus and cauda equina appear normal. Paraspinal and other soft tissues: Unremarkable. Disc levels: There is mild multilevel disc desiccation and narrowing throughout the thoracic spine. T10-T11: There is a small protrusion without significant spinal canal or neural foraminal stenosis T11-T12: No significant spinal canal or neural foraminal stenosis. T12-L1: There is a minimal disc bulge without significant spinal canal or neural foraminal stenosis L1-L2: There is a mild disc bulge and mild bilateral facet arthropathy with trace effusions without significant spinal canal or neural foraminal stenosis. L2-L3: There is a mild disc bulge, ligamentum flavum thickening, and bilateral facet arthropathy with a trace left effusion resulting in mild spinal canal stenosis without significant neural foraminal stenosis. L3-L4: There is a mild disc bulge, ligamentum flavum thickening, and bilateral facet arthropathy with a trace left effusion without significant spinal canal or neural foraminal stenosis L4-L5: There is a diffuse disc bulge and bilateral facet arthropathy resulting in narrowing of the subarticular zones, right more than left, without evidence of frank nerve root impingement, and mild right and no significant left neural foraminal stenosis. IMPRESSION: 1. 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 Careful attention to this numbering scheme is recommended if any intervention is planned. 2. Mild anterior wedge deformity of the L1 vertebral body with faint marrow edema, also present on the CT chest from 10/02/2021 and likely subacute in chronicity. There is no bony retropulsion or cord compression. 3. Mild anterior wedge deformity of the T11 vertebral body, chronic in appearance. 4. Overall mild multilevel degenerative changes throughout the lumbar spine as detailed above without high-grade spinal canal or neural foraminal stenosis. Electronically Signed   By: Lesia HausenPeter  Noone M.D.   On: 11/06/2021 15:34   ? ?             ?ROS: All systems reviewed are negative as they relate to the chief complaint within the history of present illness.  Patient denies fevers or chills. ? ?Assessment & Plan: ?Visit Diagnoses:  ?1. Low back pain, unspecified back pain laterality, unspecified chronicity, unspecified whether sciatica present   ?2. Bilateral primary osteoarthritis of knee   ? ? ?Plan: Lowell GuitarDiana J Hajizadeh-Amini is a 66 y.o. female who presents to the office for review of MRI lumbar spine.  MRI demonstrated mild multilevel degenerative changes throughout the lumbar spine as well as mild anterior wedge deformities of the L1 and T11 vertebral bodies that do not appear acute.  Discussed with the patient about options for her back and for her knees.  She would like to proceed with knee replacement when this is possible but with her recent stroke, plan to hold off for somewhere between 3 to 6 months.  She will follow-up in 2 months for clinical recheck and decide at that point when we can schedule her for surgery.  Patient and family member agreed with plan.  Follow-up in 2 months. ? ?Follow-Up Instructions:  No follow-ups on file.  ? ?Orders:  ?No orders of the defined types were placed in this encounter. ? ?No orders of the defined types were placed in this encounter. ? ? ? ?  Procedures: ?No procedures performed ? ? ?Clinical Data: ?No additional findings. ? ?Objective: ?Vital Signs: There were no vitals taken for this visit. ? ?Physical Exam:  ?Constitutional: Patient appears well-developed ?HEENT:  ?Head: N

## 2021-12-03 ENCOUNTER — Telehealth: Payer: Self-pay | Admitting: Physician Assistant

## 2021-12-03 NOTE — Telephone Encounter (Signed)
Marcelino Duster, OT with [facility], states she has completed OT Eval with pt. States everything is good. No further OT is needed. Pt is returning to work on Monday.  ?

## 2021-12-04 NOTE — Telephone Encounter (Signed)
Noted, thanks!

## 2021-12-05 ENCOUNTER — Encounter: Payer: Self-pay | Admitting: Physician Assistant

## 2021-12-05 ENCOUNTER — Ambulatory Visit (INDEPENDENT_AMBULATORY_CARE_PROVIDER_SITE_OTHER): Payer: Medicare Other | Admitting: Physician Assistant

## 2021-12-05 VITALS — BP 110/75 | HR 77 | Temp 98.1°F | Ht 60.0 in | Wt 203.2 lb

## 2021-12-05 DIAGNOSIS — I63511 Cerebral infarction due to unspecified occlusion or stenosis of right middle cerebral artery: Secondary | ICD-10-CM

## 2021-12-05 DIAGNOSIS — G255 Other chorea: Secondary | ICD-10-CM

## 2021-12-05 DIAGNOSIS — I10 Essential (primary) hypertension: Secondary | ICD-10-CM | POA: Diagnosis not present

## 2021-12-05 MED ORDER — CLOPIDOGREL BISULFATE 75 MG PO TABS: 75.0000 mg | ORAL_TABLET | Freq: Every day | ORAL | 0 refills | Status: DC

## 2021-12-05 NOTE — Progress Notes (Signed)
? ? ?Chief Complaint:  ?Veronica Weber is a 66 y.o. female who presents today for a TCM visit. ? ? ?Subjective:  ?HPI: ? ?Summary of Hospital admission: ?Reason for admission: Right middle cerebral artery stroke ?Date of admission: 11/19/2021 ?Date of discharge: 11/23/2021 ?Date of Interactive contact: 11/26/2021 with [facility] care [facility] ?Summary of Hospital course: Patient is a 66 year old female with history of vasovagal syncope and tobacco use.  She lives alone.  She went to bed on 11/14/2021 and woke up the next morning with involuntary movements in her left arm and leg.  She was admitted on 11/16/2021 after CT and MRI of the brain showed patchy and scattered small acute infarcts in the right MCA.  She was placed on aspirin and Plavix.  Subcutaneous Lovenox for DVT prevention.  She was placed in rehab and had significant improvement in her activity and balance as well as functional use of her extremities.  Discharged home in stable condition. ? ?Interim history:  ?Denies any symptoms, feels back to herself. ? ?Daughter in [place] and will be back tomorrow for one month to help.  ? ?Patient feels tired and bored.  ?Needing to go back to work on light duty, needs note for this. ? ?[place] coming out & telling her she needs to stay on Plavix until her knee surgery in July 2023.  ?Walking dog three times weekly.  ? ?Wanting to switch to ?[place] ?[phone] ? ?ROS: Still having chronic issues with sleep. Melatonin has not helped. , otherwise a complete review of systems was negative.  ? ?PMH: ? ?The following were reviewed and entered/updated in epic: ?Past Medical History:  ?Diagnosis Date  ? Arthritis   ? Cataract   ? Hyperlipidemia   ? Hypertension   ? Thyroid disease   ? Tobacco use   ? ?Patient Active Problem List  ? Diagnosis Date Noted  ? Obesity, Class III, BMI 40-49.9 (morbid obesity) ([place]) 11/19/2021  ? Right middle cerebral artery stroke ([place]) 11/19/2021  ?  Acute CVA (cerebrovascular accident) ([place]) 11/18/2021  ? 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 Hypertension 11/16/2021  ? Hyperlipidemia 11/16/2021  ? Tobacco use 11/16/2021  ? Syncope and collapse 06/26/2021  ? ?Past Surgical History:  ?Procedure Laterality Date  ? CATARACT EXTRACTION    ? 1997, 1998  ? [place]  ? CHOLECYSTECTOMY  1998  ? TUBAL LIGATION  1994  ? ? ?Family History  ?Problem Relation Age of Onset  ? Arthritis Mother   ? Cancer Maternal Grandmother   ? Diabetes Maternal Grandmother   ? Diabetes Maternal Grandfather   ? Healthy Daughter   ? Healthy Son   ? ? ?Medications- Reconciled discharge and current medications in Epic.  ?Current Outpatient Medications  ?Medication Sig Dispense Refill  ? acetaminophen (TYLENOL) 325 MG tablet Take 2 tablets (650 mg total) by mouth every 4 (four) hours as needed for mild pain (or temp > 37.5 C (99.5 F)).    ? ARIPiprazole (ABILIFY) 5 MG tablet Take 1 tablet (5 mg total) by mouth daily. 30 tablet 0  ? aspirin EC 81 MG EC tablet Take 1 tablet (81 mg total) by mouth daily. Swallow whole. 30 tablet 11  ? atorvastatin (LIPITOR) 80 MG tablet Take 1 tablet (80 mg total) by mouth daily. 30 tablet 0  ? chlorthalidone (HYGROTON) 25 MG tablet Take 1 tablet (25 mg total) by mouth daily. 90 tablet 0  ? lidocaine (LIDODERM) 5 % PLACE 1  PATCH ONTO THE SKIN DAILY, REMOVE AFTER 12 HOURS OR AS DIRECTED BY MD 90 patch 0  ? losartan (COZAAR) 50 MG tablet Take 1 tablet (50 mg total) by mouth daily. 30 tablet 0  ? magnesium gluconate (MAGONATE) 500 MG tablet Take 0.5 tablets (250 mg total) by mouth at bedtime. 30 tablet 0  ? nicotine (NICODERM CQ - DOSED IN MG/24 HOURS) 21 mg/24hr patch 21 mg patch daily x2 weeks then 14 mg patch daily x3 weeks then 7 mg patch daily x3 weeks and stop 84 patch 0  ? clopidogrel (PLAVIX) 75 MG tablet Take 1 tablet (75 mg total) by mouth daily. 90 tablet 0  ? ?No current facility-administered medications for this visit.  ? ? ?Allergies-reviewed  and updated ?No Known Allergies ? ?Social History  ? ?Socioeconomic History  ? Marital status: Divorced  ?  Spouse name: Not on file  ? Number of children: 2  ? Years of education: Not on file  ? Highest education level: Some college, no degree  ?Occupational History  ? Not on file  ?Tobacco Use  ? Smoking status: Every Day  ?  Packs/day: 0.50  ?  Years: 45.00  ?  Pack years: 22.50  ?  Types: Cigarettes  ?  Start date: 06/01/1976  ? Smokeless tobacco: Never  ?Vaping Use  ? Vaping Use: Never used  ?Substance and Sexual Activity  ? Alcohol use: Not Currently  ? Drug use: Never  ? Sexual activity: Not Currently  ?Other Topics Concern  ? Not on file  ?Social History Narrative  ? Lives alone  ? Caffeine- coffee 1 -2 c daily  ? ?Social Determinants of Health  ? ?Financial Resource Strain: Not on file  ?Food Insecurity: Not on file  ?Transportation Needs: Not on file  ?Physical Activity: Not on file  ?Stress: Not on file  ?Social Connections: Not on file  ? ?   ?  ?Objective:  ?Physical Exam: ?BP 110/75   Pulse 77   Temp 98.1 ?F (36.7 ?C) (Temporal)   Ht 5' (1.524 m)   Wt 203 lb 3.2 oz (92.2 kg)   SpO2 97%   BMI 39.68 kg/m?   ?Gen: NAD, resting comfortably ?CV: RRR with no murmurs appreciated ?Pulm: NWOB, CTAB with no crackles, wheezes, or rhonchi ?GI: Normal bowel sounds present. Soft, Nontender, Nondistended. ?MSK: No edema, cyanosis, or clubbing noted ?Skin: Warm, dry ?Neuro: Grossly normal, moves all extremities, 5/5 strength all extremities  ?Psych: Normal affect and thought content ? ?Assessment/Plan:  ? ?Patient has a high level of medical decision making.  ? ?1. Right middle cerebral artery stroke [facility]) ?Plavix 75 mg daily x 90 days  ?Aspirin 81 mg daily  ?Lipitor 80 mg daily. ?Plan to follow-up with neurology ?Plan to continue follow-up with physical medicine rehab ? ?2. Essential hypertension ?Chlorthalidone 25 mg daily and Cozaar 50 mg daily.  Blood pressure currently to goal and stable. ? ?3. Left-sided  hemichorea ?Taking Abilify 5 mg daily.  Symptoms have resolved. ? ? ?Summary/Review of work up during hospitalization: ?CT head without contrast showed no acute abnormalities.  Small focal encephalomalacia in the left posterior frontal/parietal lobe suggest prior infarct. ? ?MRI brain without contrast showed patchy and scattered small acute infarcts in the right MCA ? ?CT angio head neck with and without contrast showed no large vessel occlusion. ? ?Echocardiogram showed LVEF of 60 to 65%. ? ?Time Spent: ?40 minutes of total time was spent on the date of the encounter performing  the following actions: chart review prior to seeing the patient, obtaining history, performing a medically necessary exam, counseling on the treatment plan, placing orders, and documenting in our EHR.  ? ?   ? ?Elis Sauber, PA-C  ?

## 2021-12-10 ENCOUNTER — Telehealth: Payer: Self-pay | Admitting: Physician Assistant

## 2021-12-10 NOTE — Telephone Encounter (Signed)
Pt is currently on 2 medications for her BP is wanting to know if she needs to continue both or just one of them. Please advise ? ?MEDICATIONS:losartan (COZAAR) 50 MG tablet ? ?chlorthalidone (HYGROTON) 25 MG tablet ?

## 2021-12-12 ENCOUNTER — Encounter: Payer: Self-pay | Admitting: Physician Assistant

## 2021-12-12 ENCOUNTER — Ambulatory Visit (INDEPENDENT_AMBULATORY_CARE_PROVIDER_SITE_OTHER): Payer: Medicare Other | Admitting: Physician Assistant

## 2021-12-12 VITALS — BP 102/67 | HR 89 | Temp 97.2°F | Ht 60.0 in | Wt 201.8 lb

## 2021-12-12 DIAGNOSIS — G479 Sleep disorder, unspecified: Secondary | ICD-10-CM | POA: Diagnosis not present

## 2021-12-12 DIAGNOSIS — I10 Essential (primary) hypertension: Secondary | ICD-10-CM | POA: Diagnosis not present

## 2021-12-12 DIAGNOSIS — I63511 Cerebral infarction due to unspecified occlusion or stenosis of right middle cerebral artery: Secondary | ICD-10-CM | POA: Diagnosis not present

## 2021-12-12 DIAGNOSIS — F17200 Nicotine dependence, unspecified, uncomplicated: Secondary | ICD-10-CM | POA: Diagnosis not present

## 2021-12-12 DIAGNOSIS — Z0279 Encounter for issue of other medical certificate: Secondary | ICD-10-CM

## 2021-12-12 NOTE — Telephone Encounter (Signed)
Message sent thru MyChart 

## 2021-12-12 NOTE — Progress Notes (Signed)
? ?Subjective:  ? ? Patient ID: Veronica Weber, female    DOB: [birth year], 66 y.o.   MRN: [id number] ? ?Chief Complaint  ?Patient presents with  ? FLMA  ?  Pt she to have FMLA papers filled out to go back to work after having a Stroke  ? ? ?HPI ?Patient is in today for with her daughter to have FMLA forms completed after recent hospitalization for stroke. (See last office visit note for details). ? ?Interim history:  ?Not sleeping well.  ?May 2nd has appt with Neurology & plans to discuss with them.  ? ?Still doing well with Nicoderm patches. ? ?Rehab discharge appt was this last Tuesday & they were concerned about her blood pressure medications. Has not been tracking at home. Taking Chlorthalidone 25 mg and losartan 50 mg daily.  ? ?Past Medical History:  ?Diagnosis Date  ? Arthritis   ? Cataract   ? Hyperlipidemia   ? Hypertension   ? Thyroid disease   ? Tobacco use   ? ? ?Past Surgical History:  ?Procedure Laterality Date  ? CATARACT EXTRACTION    ? 1997, 1998  ? CESAREAN SECTION  1983  ? CHOLECYSTECTOMY  1998  ? TUBAL LIGATION  1994  ? ? ?Family History  ?Problem Relation Age of Onset  ? Arthritis Mother   ? Cancer Maternal Grandmother   ? Diabetes Maternal Grandmother   ? Diabetes Maternal Grandfather   ? Healthy Daughter   ? Healthy Son   ? ? ?Social History  ? ?Tobacco Use  ? Smoking status: Every Day  ?  Packs/day: 0.50  ?  Years: 45.00  ?  Pack years: 22.50  ?  Types: Cigarettes  ?  Start date: 06/01/1976  ? Smokeless tobacco: Never  ?Vaping Use  ? Vaping Use: Never used  ?Substance Use Topics  ? Alcohol use: Not Currently  ? Drug use: Never  ?  ? ?No Known Allergies ? ?Review of Systems ?NEGATIVE UNLESS OTHERWISE INDICATED IN HPI ? ? ?   ?Objective:  ?  ? ?BP 102/67   Pulse 89   Temp (!) 97.2 ?F (36.2 ?C)   Ht 5' (1.524 m)   Wt 201 lb 12.8 oz (91.5 kg)   SpO2 96%   BMI 39.41 kg/m?  ? ?Wt Readings from Last 3 Encounters:  ?12/12/21 201 lb 12.8 oz (91.5 kg)  ?12/05/21 203 lb 3.2 oz (92.2 kg)   ?11/19/21 205 lb 0.4 oz (93 kg)  ? ? ?BP Readings from Last 3 Encounters:  ?12/12/21 102/67  ?12/05/21 110/75  ?11/23/21 133/76  ?  ? ?Physical Exam ?Constitutional:   ?   Appearance: Normal appearance.  ?Cardiovascular:  ?   Rate and Rhythm: Normal rate and regular rhythm.  ?Pulmonary:  ?   Effort: Pulmonary effort is normal.  ?   Breath sounds: Normal breath sounds.  ?Neurological:  ?   Mental Status: She is alert.  ?Psychiatric:     ?   Mood and Affect: Mood normal.     ?   Behavior: Behavior normal.  ? ? ?   ?Assessment & Plan:  ? ?Problem List Items Addressed This Visit   ? ?  ? Cardiovascular and Mediastinum  ? Right middle cerebral artery stroke [facility]) - Primary  ? ?Other Visit Diagnoses   ? ? Essential hypertension      ? Current every day smoker      ? Difficulty sleeping      ? ?  ? ? ?  Plan: ? ?1. Right middle cerebral artery stroke [facility]) ?FMLA paperwork completed in office with pt ? ?2. Essential hypertension ?Cont Chlorthalidone 25 mg and Losartan 50 mg daily ?Monitor at home ? ?3. Current every day smoker ?Doing great with patches! Keep going. ? ?4. Difficulty sleeping ?Ongoing. She will discuss with neurology about maybe needing sleep study.  ? ? ? ?This note was prepared with assistance of Conservation officer, historic buildings. Occasional wrong-word or sound-a-like substitutions may have occurred due to the inherent limitations of voice recognition software. ? ?Time Spent: ?29 minutes of total time was spent on the date of the encounter performing the following actions: chart review prior to seeing the patient, obtaining history, performing a medically necessary exam, counseling on the treatment plan, completing FMLA paperwork, and documenting in our EHR.   ? ?Maygen Sirico M Taydon Nasworthy, PA-C ?

## 2021-12-12 NOTE — Patient Instructions (Addendum)
Monitor blood pressure at home over the next week, bring log to appt on Monday. Twice daily.  ? ?Keep up good work at home! ? ?FMLA paperwork completed in office today.  ? ? ?

## 2021-12-13 ENCOUNTER — Telehealth: Payer: Self-pay | Admitting: Physician Assistant

## 2021-12-13 NOTE — Telephone Encounter (Signed)
Pt stated the recipient (HR) has not received the FMLA paperwork that was completed on 04/13.  ? ?Requesting forms be sent (again?). ? ?Provided another phone number for faxing: ?[phone] ?

## 2021-12-16 ENCOUNTER — Ambulatory Visit (INDEPENDENT_AMBULATORY_CARE_PROVIDER_SITE_OTHER): Payer: Medicare Other | Admitting: Physician Assistant

## 2021-12-16 ENCOUNTER — Encounter: Payer: Self-pay | Admitting: Physician Assistant

## 2021-12-16 VITALS — BP 119/78 | HR 62 | Temp 97.2°F | Ht 60.0 in | Wt 199.6 lb

## 2021-12-16 DIAGNOSIS — Z7982 Long term (current) use of aspirin: Secondary | ICD-10-CM

## 2021-12-16 DIAGNOSIS — I10 Essential (primary) hypertension: Secondary | ICD-10-CM

## 2021-12-16 DIAGNOSIS — I700 Atherosclerosis of aorta: Secondary | ICD-10-CM

## 2021-12-16 DIAGNOSIS — Z01118 Encounter for examination of ears and hearing with other abnormal findings: Secondary | ICD-10-CM | POA: Diagnosis not present

## 2021-12-16 DIAGNOSIS — Z6839 Body mass index (BMI) 39.0-39.9, adult: Secondary | ICD-10-CM

## 2021-12-16 DIAGNOSIS — Z9181 History of falling: Secondary | ICD-10-CM

## 2021-12-16 DIAGNOSIS — E669 Obesity, unspecified: Secondary | ICD-10-CM

## 2021-12-16 DIAGNOSIS — Z7902 Long term (current) use of antithrombotics/antiplatelets: Secondary | ICD-10-CM

## 2021-12-16 DIAGNOSIS — R94128 Abnormal results of other function studies of ear and other special senses: Secondary | ICD-10-CM

## 2021-12-16 DIAGNOSIS — M47816 Spondylosis without myelopathy or radiculopathy, lumbar region: Secondary | ICD-10-CM

## 2021-12-16 DIAGNOSIS — M170 Bilateral primary osteoarthritis of knee: Secondary | ICD-10-CM

## 2021-12-16 DIAGNOSIS — E079 Disorder of thyroid, unspecified: Secondary | ICD-10-CM

## 2021-12-16 DIAGNOSIS — Z136 Encounter for screening for cardiovascular disorders: Secondary | ICD-10-CM | POA: Diagnosis not present

## 2021-12-16 DIAGNOSIS — G8929 Other chronic pain: Secondary | ICD-10-CM

## 2021-12-16 DIAGNOSIS — Z0000 Encounter for general adult medical examination without abnormal findings: Secondary | ICD-10-CM

## 2021-12-16 DIAGNOSIS — Z8673 Personal history of transient ischemic attack (TIA), and cerebral infarction without residual deficits: Secondary | ICD-10-CM

## 2021-12-16 DIAGNOSIS — E785 Hyperlipidemia, unspecified: Secondary | ICD-10-CM

## 2021-12-16 DIAGNOSIS — I051 Rheumatic mitral insufficiency: Secondary | ICD-10-CM

## 2021-12-16 DIAGNOSIS — M5134 Other intervertebral disc degeneration, thoracic region: Secondary | ICD-10-CM

## 2021-12-16 DIAGNOSIS — M47812 Spondylosis without myelopathy or radiculopathy, cervical region: Secondary | ICD-10-CM

## 2021-12-16 NOTE — Patient Instructions (Addendum)
Ask your pharmacist about Shingles Vaccines  ?Please complete Cologuard or consider a colonoscopy ?Referral to ENT due to failed hearing test left ear.  ? ?See you back as scheduled!  ? ?

## 2021-12-16 NOTE — Progress Notes (Signed)
? ? ?Chief Complaint:  ?Veronica Weber is a 66 y.o. female who presents today for a Medicare Initial Preventive Examination. ? ? ?  ?Subjective:  ?HPI: ? ?Monitor blood pressure at home. 113/68; 11/74, 112/75, 108/73, 119/71, 113/75 ?Only having some intermittent dizzy feeling if bends over too quickly.  ? ?Activities of Daily Living: ?He has no difficulty performing the following: ?Preparing food and eating ?Bathing  ?Getting dressed ?Using the toilet ?Shopping ?Managing Finances ?Moving around from place to place ? ?Fall Screening: ?She has not had any falls within the past year.  ? ?Hearing Screening: ?Patient has no concerns over hearing. ? ?Safety Screening ?Patient feels safe at home.  There are no smokers at home (one month quit so far, still wearing patches).  ? ?Depression Screening: ? ?  12/16/2021  ? 11:06 AM  ?Depression screen PHQ 2/9  ?Decreased Interest 0  ?Down, Depressed, Hopeless 0  ?PHQ - 2 Score 0  ? ? ?Lifestyle Factors: ?Diet: Cutting back on bread & sodas; drinking Propel waters; some sugar in coffee; chicken; egg salad, more fruits ?Exercise: Walking dogs 1-2 times per day ? ? ?Patient Care Team: ?Alpha Mysliwiec, Crist InfanteAlyssa M, PA-C as PCP - General (Physician Assistant) ?Maisie FusBranch, Mary E, MD as PCP - Cardiology (Cardiology)  ? ?ROS: Negative unless indicated in HPI ? ?PMH: ? ?The following were reviewed and entered/updated in epic: ?Past Medical History:  ?Diagnosis Date  ? Arthritis   ? Cataract   ? 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  ? Hyperlipidemia   ? Hypertension   ? Thyroid disease   ? Tobacco use   ? ?Past Surgical History:  ?Procedure Laterality Date  ? CATARACT EXTRACTION    ? 1997, 1998  ? CESAREAN SECTION  1983  ? CHOLECYSTECTOMY  1998  ? TUBAL LIGATION  1994  ? ?Family History  ?Problem Relation Age of Onset  ? Arthritis Mother   ? Cancer Maternal Grandmother   ? Diabetes Maternal Grandmother   ? Diabetes Maternal Grandfather   ? Healthy Daughter   ? Healthy Son    ? ? ?Medications- reviewed and updated ?Current Outpatient Medications  ?Medication Sig Dispense Refill  ? ARIPiprazole (ABILIFY) 5 MG tablet Take 1 tablet (5 mg total) by mouth daily. 30 tablet 0  ? aspirin EC 81 MG EC tablet Take 1 tablet (81 mg total) by mouth daily. Swallow whole. 30 tablet 11  ? atorvastatin (LIPITOR) 80 MG tablet Take 1 tablet (80 mg total) by mouth daily. 30 tablet 0  ? chlorthalidone (HYGROTON) 25 MG tablet Take 1 tablet (25 mg total) by mouth daily. 90 tablet 0  ? clopidogrel (PLAVIX) 75 MG tablet Take 1 tablet (75 mg total) by mouth daily. 90 tablet 0  ? lidocaine (LIDODERM) 5 % PLACE 1 PATCH ONTO THE SKIN DAILY, REMOVE AFTER 12 HOURS OR AS DIRECTED BY MD 90 patch 0  ? losartan (COZAAR) 50 MG tablet Take 1 tablet (50 mg total) by mouth daily. 30 tablet 0  ? magnesium gluconate (MAGONATE) 500 MG tablet Take 0.5 tablets (250 mg total) by mouth at bedtime. 30 tablet 0  ? nicotine (NICODERM CQ - DOSED IN MG/24 HOURS) 21 mg/24hr patch 21 mg patch daily x2 weeks then 14 mg patch daily x3 weeks then 7 mg patch daily x3 weeks and stop 84 patch 0  ? acetaminophen (TYLENOL) 325 MG tablet Take 2 tablets (650 mg total) by mouth every 4 (four) hours as needed for mild pain (or temp >  37.5 C (99.5 F)). (Patient not taking: Reported on 12/16/2021)    ? ?No current facility-administered medications for this visit.  ? ? ?Allergies-reviewed and updated ?No Known Allergies ? ?Social History  ? ?Socioeconomic History  ? Marital status: Divorced  ?  Spouse name: Not on file  ? Number of children: 2  ? Years of education: Not on file  ? Highest education level: Some college, no degree  ?Occupational History  ? Not on file  ?Tobacco Use  ? Smoking status: Every Day  ?  Packs/day: 0.50  ?  Years: 45.00  ?  Pack years: 22.50  ?  Types: Cigarettes  ?  Start date: 06/01/1976  ? Smokeless tobacco: Never  ?Vaping Use  ? Vaping Use: Never used  ?Substance and Sexual Activity  ? Alcohol use: Not Currently  ? Drug use:  Never  ? Sexual activity: Not Currently  ?Other Topics Concern  ? Not on file  ?Social History Narrative  ? Lives alone  ? Caffeine- coffee 1 -2 c daily  ? ?Social Determinants of Health  ? ?Financial Resource Strain: Not on file  ?Food Insecurity: Not on file  ?Transportation Needs: Not on file  ?Physical Activity: Not on file  ?Stress: Not on file  ?Social Connections: Not on file  ? ? ?   ?  ?Objective/Observations  ?Physical Exam: ?BP 119/78   Pulse 62   Temp (!) 97.2 ?F (36.2 ?C) (Temporal)   Ht 5' (1.524 m)   Wt 199 lb 9.6 oz (90.5 kg)   SpO2 (!) 86%   BMI 38.98 kg/m?  ?Passed Vision screening / FAILED Hearing Screening on the LEFT ? ?  12/05/2021  ?  1:19 PM 10/15/2020  ? 10:07 AM  ?Fall Risk   ?Falls in the past year? 0 1  ?Number falls in past yr: 0 0  ?Injury with Fall? 0 1  ?Risk for fall due to : No Fall Risks History of fall(s)  ?Follow up Falls evaluation completed Falls evaluation completed  ? ?No results found. ?Gen: Well dressed & groomed; NAD, resting comfortably ?CV: RRR with no murmurs appreciated ?Pulm: NWOB, CTAB with no crackles, wheezes, or rhonchi ?GI: Normal bowel sounds present. Soft, Nontender, Nondistended. ?MSK: no edema, cyanosis, or clubbing noted ?Skin: warm, dry ?Neuro: grossly normal, moves all extremities ?Psych: Normal affect and thought content. Normal minicog.  ? ?   ? ?Assessment/Plan:  ?New/Acute Problems: ?Failed hearing test L ear - refer to ENT ? ?Chronic Problems Addressed Today: ?HTN - stable, to goal, cont to monitor at home. Cont on Losartan 50 mg and Chlorthalidone 25 mg.  ? ? ?Preventative Healthcare ?Health Maintenance  ?Topic Date Due  ? Hepatitis C Screening  Never done  ? Zoster Vaccines- Shingrix (1 of 2) Never done  ? COLONOSCOPY (Pts 45-73yrs Insurance coverage will need to be confirmed)  Never done  ? COVID-19 Vaccine (3 - Moderna risk series) 01/01/2022 (Originally 01/09/2020)  ? TETANUS/TDAP  05/27/2022 (Originally [birth year]/29/1975)  ? INFLUENZA VACCINE   04/01/2022  ? MAMMOGRAM  06/13/2023  ? Pneumonia Vaccine 64+ Years old  Completed  ? DEXA SCAN  Completed  ? HIV Screening  Completed  ? HPV VACCINES  Aged Out  ? ? ?Patient Counseling(The following topics were reviewed and/or handout was given): ? -Nutrition: Stressed importance of moderation in sodium/caffeine intake, saturated fat and cholesterol, caloric balance, sufficient intake of fresh fruits, vegetables, and fiber. ? -Stressed the importance of regular exercise.  ? -Substance Abuse:  Discussed cessation/primary prevention of tobacco, alcohol, or other drug use; driving or other dangerous activities under the influence; availability of treatment for abuse.  ? -Injury prevention: Discussed safety belts, safety helmets, smoke detector, smoking near bedding or upholstery.  ? -Dental health: Discussed importance of regular tooth brushing, flossing, and dental visits. ? -Health maintenance and immunizations reviewed. Please refer to Health maintenance section. Planning on Cologuard and Shingrix vaccines.  ? ?Advance Directives were discussed with the patient. Plans to complete this this May with her children in person.  ? ?Patient Instructions (the written plan) was given to the patient. ? ?Medicare Attestation ?I have personally reviewed: ?The patient's medical and social history ?Their use of alcohol, tobacco or illicit drugs ?Their current medications and supplements ?The patient's functional ability including ADLs,fall risks, home safety risks, cognitive, and hearing and visual impairment ?Diet and physical activities ?Evidence for depression or mood disorders ?Does not use opioids ? ?I personally reviewed patient's EKG in office today of NSR at 74 bpm, no ST or T wave changes.  ? ?The patient's weight, height, BMI, and visual acuity have been recorded in the chart.  I have made referrals, counseling, and provided education to the patient based on review of the above and I have provided the patient with a  written personalized care plan for preventive services.   ? ?During the course of the visit the patient was educated and counseled about appropriate screening and preventive services including:  ?       Shingrix and Colonoscopy ? ?[place]

## 2021-12-16 NOTE — Telephone Encounter (Signed)
Pt had an appointment in office today. She states that paperwork was submitted and she has already received a determination. Work note was faxed to [employer]. Confirmation will be scanned in patient's chart.  ?

## 2021-12-19 ENCOUNTER — Telehealth: Payer: Self-pay | Admitting: Physician Assistant

## 2021-12-19 MED ORDER — ARIPIPRAZOLE 5 MG PO TABS
5.0000 mg | ORAL_TABLET | Freq: Every day | ORAL | 1 refills | Status: DC
Start: 2021-12-19 — End: 2022-01-16

## 2021-12-19 NOTE — Telephone Encounter (Signed)
Spoke to pt told her Rx for Abilify was sent to pharmacy. Pt verbalized understanding. ?

## 2021-12-19 NOTE — Telephone Encounter (Signed)
Patient needs a refill for ARIPiprazole (ABILIFY) 5 MG tablet-  ?

## 2021-12-19 NOTE — Telephone Encounter (Signed)
Pt requesting refill for Abilify 5 mg daily, ordered by previous provider. Please advise if okay to fill? ?

## 2021-12-31 ENCOUNTER — Ambulatory Visit (INDEPENDENT_AMBULATORY_CARE_PROVIDER_SITE_OTHER): Payer: Medicare Other | Admitting: Psychiatry

## 2021-12-31 ENCOUNTER — Telehealth: Payer: Self-pay

## 2021-12-31 ENCOUNTER — Encounter: Payer: Self-pay | Admitting: Psychiatry

## 2021-12-31 VITALS — BP 108/65 | HR 71 | Ht 60.0 in | Wt 200.0 lb

## 2021-12-31 DIAGNOSIS — Z8673 Personal history of transient ischemic attack (TIA), and cerebral infarction without residual deficits: Secondary | ICD-10-CM

## 2021-12-31 MED ORDER — GABAPENTIN 300 MG PO CAPS: ORAL_CAPSULE | ORAL | 3 refills | Status: DC

## 2021-12-31 NOTE — Telephone Encounter (Signed)
done

## 2021-12-31 NOTE — Progress Notes (Signed)
? ?  CC:  stroke ? ?Follow-up Visit ? ?Last visit: 06/26/21 ? ?Brief HPI: 66 year old female with a history of HTN who was previously seen in clinic for syncope. ? ?At her last visit EEG was ordered. ? ?Interval History: ?EEG 07/01/21 was normal. She saw Cardiology who put her on a 30 day cardio monitor which was negative for arrhythmia. She was noted to have diffuse calcification in her aorta. Chlorthalidone 25 mg daily was added for her BP. ? ?She presented to the ED 11/16/21 with abnormal left arm movements and was found to have scattered right frontal/parietal infarcts with moderate-severe right M1 stenosis. TTE showed no cardiogenic source of embolism. She was started on ASA and plavix x3 months, then planned for ASA monotherapy. LDL was 120 and she was started on lipitor 80 mg daily. A1c 5.8. 30 day cardionet 07/2021 did not show afib. She was started on abilify for hemichorea. Chlorthalidone was decreased to 12.5 mg daily. ? ?Today she is essentially back to baseline. Her abnormal movements have resolved. She has stopped smoking. Struggles with mild headaches and trouble sleeping at night. ? ? ?Physical Exam:  ? ?Vital Signs: ?BP 108/65   Pulse 71   Ht 5' (1.524 m)   Wt 200 lb (90.7 kg)   BMI 39.06 kg/m?  ?GENERAL:  well appearing, in no acute distress, alert  ?SKIN:  Color, texture, turgor normal. No rashes or lesions ?HEAD:  Normocephalic/atraumatic. ?RESP: normal respiratory effort ?MSK:  No gross joint deformities.  ? ?NEUROLOGICAL: ?Mental Status: Alert, oriented to person, place and time, Follows commands, and Speech fluent and appropriate. 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 ?IMPRESSION: ?66 year old female with a history of HTN who presents for follow up of right MCA stroke, likely secondary to right M1 stenosis.Will continue ASA and Plavix for a total of 3 months,  then plan for ASA monotherapy. Will stop Abilify as her abnormal movements have resolved. She would like to try a medication to help with her headaches and sleep. Will start PM dose of gabapentin. ? ?PLAN: ?-Stop Abilify ?-Continue ASA and Plavix for a total of 3 months, then continue on ASA alone ?-Continue lipitor 80 mg daily ?-Start gabapentin 300-600 mg QHS for headaches and sleep ? ?Follow-up: 6 months ? ?I spent a total of 45 minutes on the date of the service. Discussed medication side effects, adverse reactions and drug interactions. Written educational materials and patient instructions outlining all of the above were given. ? ?[name], MD ?12/31/21 ?12:19 PM ? ?

## 2021-12-31 NOTE — Telephone Encounter (Signed)
Refill request for Losartan 50 mg. Is this okay or does this need to go to PCP> Patient does not have an appt with you until September. Patient discharged in March. ?

## 2021-12-31 NOTE — Patient Instructions (Addendum)
Increase magnesium to 500 mg daily for headache prevention ?Continue Aspirin and Plavix for a total of 3 months, then can stop Plavix and take just Aspirin daily ?Stop Abilify ?Start gabapentin to help with with headaches and sleep. Take 1-2 pills at bedtime ?

## 2022-01-13 ENCOUNTER — Telehealth: Payer: Self-pay | Admitting: Surgical

## 2022-01-13 NOTE — Telephone Encounter (Signed)
Pt called requesting refill on lidocaine patches. Please send to pharmacy on file. Pt phone number is [phone]. ?

## 2022-01-14 NOTE — Telephone Encounter (Signed)
I am okay with sending that in or she can wait until her upcoming appointment on the 26th to discuss.

## 2022-01-15 ENCOUNTER — Telehealth: Payer: Self-pay

## 2022-01-15 ENCOUNTER — Other Ambulatory Visit: Payer: Self-pay

## 2022-01-15 MED ORDER — LIDOCAINE 5 % EX PTCH
MEDICATED_PATCH | CUTANEOUS | 0 refills | Status: DC
Start: 2022-01-15 — End: 2022-05-24

## 2022-01-15 NOTE — Telephone Encounter (Signed)
Called patient to let her know that we did send in lidocaine patches  ?

## 2022-01-16 ENCOUNTER — Encounter: Payer: Self-pay | Admitting: Physician Assistant

## 2022-01-16 ENCOUNTER — Ambulatory Visit (INDEPENDENT_AMBULATORY_CARE_PROVIDER_SITE_OTHER): Payer: Medicare Other | Admitting: Physician Assistant

## 2022-01-16 ENCOUNTER — Telehealth: Payer: Self-pay | Admitting: Surgical

## 2022-01-16 VITALS — BP 109/76 | HR 77 | Temp 97.9°F | Ht 60.0 in | Wt 201.0 lb

## 2022-01-16 DIAGNOSIS — G479 Sleep disorder, unspecified: Secondary | ICD-10-CM

## 2022-01-16 DIAGNOSIS — Z720 Tobacco use: Secondary | ICD-10-CM

## 2022-01-16 DIAGNOSIS — I10 Essential (primary) hypertension: Secondary | ICD-10-CM | POA: Diagnosis not present

## 2022-01-16 DIAGNOSIS — I63511 Cerebral infarction due to unspecified occlusion or stenosis of right middle cerebral artery: Secondary | ICD-10-CM

## 2022-01-16 MED ORDER — NICOTINE 14 MG/24HR TD PT24: 14.0000 mg | MEDICATED_PATCH | Freq: Every day | TRANSDERMAL | 0 refills | Status: AC

## 2022-01-16 MED ORDER — MAGNESIUM GLUCONATE 500 MG PO TABS: 500.0000 mg | ORAL_TABLET | Freq: Every day | ORAL | 0 refills | Status: AC

## 2022-01-16 NOTE — Telephone Encounter (Signed)
[facility] pharmacy. Cancelled. Patient referring to lidocaine patch

## 2022-01-16 NOTE — Telephone Encounter (Signed)
Pt called with an FYI. Pt called for nicotine patches. She states she dont need them and can we cancel at pharmacy. Phone number is [phone]

## 2022-01-16 NOTE — Patient Instructions (Signed)
I'll put a call out to [facility] and see if we can get your medications switched over to [facility].  Magnesium and Nicotine patches sent to [facility].  Call if any concerns.

## 2022-01-16 NOTE — Progress Notes (Signed)
Subjective:    Patient ID: [facility], female    DOB: Feb 22, 1956, 66 y.o.   MRN: [id number]  Chief Complaint  Patient presents with   Medication Refill   Medication Problem    Medication Management    HPI Patient is in today for recheck medications & go over her blood pressure readings.  States she is needing to go through her medication list with me and also she now prefers to get all medications through [facility]. [phone] [facility] is her contact. Found out about this through [place] nurses.   BP readings in the last week (based on journal with her today):  115/74 114/74 111/80 120/73 115/80 110/76  Taking all medications as directed. She has no concerns or symptoms to report otherwise.   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   Hyperlipidemia    Hypertension    Thyroid disease    Tobacco use     Past Surgical History:  Procedure Laterality Date   CATARACT EXTRACTION     1997, 1998   CESAREAN SECTION  1983   CHOLECYSTECTOMY  1998   TUBAL LIGATION  1994    Family History  Problem Relation Age of Onset   Arthritis Mother    Cancer Maternal Grandmother    Diabetes Maternal Grandmother    Diabetes Maternal Grandfather    Healthy Daughter    Healthy Son     Social History   Tobacco Use   Smoking status: Every Day    Packs/day: 0.50    Years: 45.00    Pack years: 22.50    Types: Cigarettes    Start date: 06/01/1976   Smokeless tobacco: Never  Vaping Use   Vaping Use: Never used  Substance Use Topics   Alcohol use: Not Currently   Drug use: Never     No Known Allergies  Review of Systems NEGATIVE UNLESS OTHERWISE INDICATED IN HPI      Objective:     BP 109/76   Pulse 77   Temp 97.9 F (36.6 C) (Temporal)   Ht 5' (1.524 m)   Wt 201 lb (91.2 kg)   SpO2 94%   BMI 39.26 kg/m   Wt Readings from Last 3 Encounters:  01/16/22 201 lb (91.2 kg)   12/31/21 200 lb (90.7 kg)  12/16/21 199 lb 9.6 oz (90.5 kg)    BP Readings from Last 3 Encounters:  01/16/22 109/76  12/31/21 108/65  12/16/21 119/78     Physical Exam Constitutional:      Appearance: Normal appearance. She is obese.  Cardiovascular:     Rate and Rhythm: Normal rate and regular rhythm.  Pulmonary:     Effort: Pulmonary effort is normal.     Breath sounds: Normal breath sounds.  Neurological:     General: No focal deficit present.     Mental Status: She is alert and oriented to person, place, and time.  Psychiatric:        Mood and Affect: Mood normal.        Behavior: Behavior normal.       Assessment & Plan:   Problem List Items Addressed This Visit       Cardiovascular and Mediastinum   Essential hypertension - Primary   Right middle cerebral artery stroke [facility])     Other   Current every day smoker   Difficulty sleeping     Meds  ordered this encounter  Medications   magnesium gluconate (MAGONATE) 500 MG tablet    Sig: Take 1 tablet (500 mg total) by mouth at bedtime.    Dispense:  30 tablet    Refill:  0    Order Specific Question:   Supervising Provider    Answer:   Shelva Majestic [[id number]]   nicotine (NICODERM CQ - DOSED IN MG/24 HOURS) 14 mg/24hr patch    Sig: Place 1 patch (14 mg total) onto the skin daily for 21 days.    Dispense:  21 patch    Refill:  0    Order Specific Question:   Supervising Provider    Answer:   Shelva Majestic [[id number]]     Return for follow up as scheduled .  This note was prepared with assistance of Conservation officer, historic buildings. Occasional wrong-word or sound-a-like substitutions may have occurred due to the inherent limitations of voice recognition software.  Time Spent: 35 minutes of total time was spent on the date of the encounter performing the following actions: chart review prior to seeing the patient, obtaining history, performing a medically necessary exam, counseling on the treatment plan,  placing orders, and documenting in our EHR.      Mylee Falin M Rajean Desantiago, PA-C

## 2022-01-19 DIAGNOSIS — G479 Sleep disorder, unspecified: Secondary | ICD-10-CM | POA: Insufficient documentation

## 2022-01-19 DIAGNOSIS — F17200 Nicotine dependence, unspecified, uncomplicated: Secondary | ICD-10-CM | POA: Insufficient documentation

## 2022-01-19 NOTE — Assessment & Plan Note (Signed)
   No long-term effects, doing well  Will finish Plavix 75 mg for a total of 90 days then switch to baby ASA 81 mg daily  Reviewed note from neurology

## 2022-01-19 NOTE — Assessment & Plan Note (Signed)
   Blood pressure readings look normal / borderline low  Cont to monitor at home  If feeling dizzy / tired, may need to reduce doses  Currently taking chlorthalidone 25 mg qd and losartan 50 mg qd

## 2022-01-19 NOTE — Assessment & Plan Note (Signed)
   No longer taking abilify 5 mg  Magnesium seems to help, takes 500 mg qhs, refilled this today

## 2022-01-19 NOTE — Assessment & Plan Note (Signed)
   Doing great with the patch  Next dose (14 mg / 24 hour) sent to pharmacy x 2 weeks  Staying motivated

## 2022-01-24 ENCOUNTER — Ambulatory Visit: Payer: Medicare Other | Admitting: Orthopedic Surgery

## 2022-01-24 ENCOUNTER — Encounter: Payer: Self-pay | Admitting: Orthopedic Surgery

## 2022-01-24 VITALS — Ht 60.0 in | Wt 201.0 lb

## 2022-01-24 DIAGNOSIS — M170 Bilateral primary osteoarthritis of knee: Secondary | ICD-10-CM

## 2022-01-24 NOTE — Progress Notes (Signed)
Office Visit Note   Patient: [facility]           Date of Birth: Oct 11, 1955           MRN: [id number] Visit Date: 01/24/2022 Requested by: Allwardt, Randa Evens, PA-C [place],  [place] [id number] PCP: Fredirick Lathe, PA-C  Subjective: Chief Complaint  Patient presents with   Left Knee - Pain    HPI: Patient presents for evaluation of left knee pain.  She would like to have total knee replacement.  She did have a stroke in the middle of March.  She is under the treatment of a neurologist for that problem.  Neurologist recommended being on Plavix for 3 months and then aspirin thereafter.  Knee pain is severe and unrelenting.  Patient has night pain and rest pain as well as pain which diminishes her activities of daily living.  Has failed conservative management.              ROS: All systems reviewed are negative as they relate to the chief complaint within the history of present illness.  Patient denies  fevers or chills.   Assessment & Plan: Visit Diagnoses: No diagnosis found.  Plan: Impression is left knee pain.  Patient has had her teeth fixed.  She has been trying Tylenol and ibuprofen with very little relief as well as lidocaine patch.  Literature review today and recommendation from heart association is 6 months after stroke for elective surgery.  Recommendation from neurology is 3 months for elective surgery after stroke.  I have reached out to her to neurologist for recommendations but at this time we will tentatively plan for her to have surgery sometime in July.  The risk and benefits of surgery are discussed with her including but not limited to infection nerve vessel damage knee stiffness incomplete pain relief as well as perioperative events such as stroke heart attack and blood clot.  The rigorous nature of the rehabilitative process is also discussed.  All questions answered.  Follow-Up Instructions: No follow-ups on file.   Orders:  No  orders of the defined types were placed in this encounter.  No orders of the defined types were placed in this encounter.     Procedures: No procedures performed   Clinical Data: No additional findings.  Objective: Vital Signs: Ht 5' (1.524 m)   Wt 201 lb (91.2 kg)   BMI 39.26 kg/m   Physical Exam:   Constitutional: Patient appears well-developed HEENT:  Head: Normocephalic Eyes:EOM are normal Neck: Normal range of motion Cardiovascular: Normal rate Pulmonary/chest: Effort normal Neurologic: Patient is alert Skin: Skin is warm Psychiatric: Patient has normal mood and affect   Ortho Exam: Ortho exam demonstrates full active and passive range of motion of the ankles on the left.  Knee has range of motion of 7-95.  No effusion today.  Ankle dorsiflexion intact.  Left foot is perfused.  No groin pain on the left with internal and external rotation of the leg.  Specialty Comments:  No specialty comments available.  Imaging: No results found.   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   Social History   Occupational History   Not on file  Tobacco Use   Smoking status: Every Day    Packs/day: 0.50    Years: 45.00    Pack years: 22.50    Types: Cigarettes    Start date: 06/01/1976   Smokeless tobacco: Never  Vaping Use   Vaping Use: Never used  Substance and Sexual Activity   Alcohol use: Not Currently   Drug use: Never   Sexual activity: Not Currently

## 2022-02-11 ENCOUNTER — Other Ambulatory Visit: Payer: Self-pay

## 2022-02-11 ENCOUNTER — Telehealth: Payer: Self-pay | Admitting: Physician Assistant

## 2022-02-11 DIAGNOSIS — I63511 Cerebral infarction due to unspecified occlusion or stenosis of right middle cerebral artery: Secondary | ICD-10-CM

## 2022-02-11 DIAGNOSIS — Z0000 Encounter for general adult medical examination without abnormal findings: Secondary | ICD-10-CM

## 2022-02-11 NOTE — Chronic Care Management (AMB) (Signed)
  Chronic Care Management   Outreach Note  02/11/2022 Name: [facility] MRN: [id number] DOB: 02-29-1956  Referred by: Allwardt, Randa Evens, PA-C Reason for referral : No chief complaint on file.   An unsuccessful telephone outreach was attempted today. The patient was referred to the pharmacist for assistance with care management and care coordination.   Follow Up Plan: REFERRAL/ NO COPAY  Tatjana Dellinger Upstream Scheduler

## 2022-02-12 ENCOUNTER — Telehealth: Payer: Self-pay | Admitting: Physician Assistant

## 2022-02-12 NOTE — Progress Notes (Signed)
  Chronic Care Management   Note  02/12/2022 Name: EDYE HAINLINE MRN: [id number] DOB: 1955/10/10  MALASHIA KAMAKA is a 66 y.o. year old female who is a primary care patient of Allwardt, Crist Infante, PA-C. I reached out to Lowell Guitar by phone today in response to a referral sent by Ms. Lowry Bowl Hajizadeh-Amini's PCP, Allwardt, Crist Infante, PA-C.   Ms. Hitch was given information about Chronic Care Management services today including:  CCM service includes personalized support from designated clinical staff supervised by her physician, including individualized plan of care and coordination with other care providers 24/7 contact phone numbers for assistance for urgent and routine care needs. Service will only be billed when office clinical staff spend 20 minutes or more in a month to coordinate care. Only one practitioner may furnish and bill the service in a calendar month. The patient may stop CCM services at any time (effective at the end of the month) by phone call to the office staff.   Patient agreed to services and verbal consent obtained.   Follow up plan:REFERRAL/NO COPAY   Tatjana Dellinger Upstream Scheduler

## 2022-02-13 ENCOUNTER — Telehealth: Payer: Self-pay | Admitting: Pharmacist

## 2022-02-13 NOTE — Progress Notes (Signed)
Chronic Care Management Pharmacy Assistant   Name: [facility]  MRN: [id number] DOB: 1956-04-01  Reason for Encounter: Chart Review For Initial Visit With Clinical Pharmacist   Conditions to be addressed/monitored: HTN and HLD  Primary concerns for visit include:  HTN and HLD  Recent office visits:  02/13/2022 [facility] (PCP) Allwardt, Alyssa M, PA-C;  no medication changes indicated.  12/16/2021 [facility] (PCP) Allwardt, Alyssa M, PA-C;  no medication changes indicated.  12/12/2021 [facility] (PCP) Allwardt, Alyssa M, PA-C;  no medication changes indicated.  12/05/2021 [facility] (PCP) Allwardt, Alyssa M, PA-C;  no medication changes indicated.  Recent consult visits:  01/24/2022 [facility] (Orthopedics) [name], MD; no medication changes indicated.  12/31/2021 [facility] (Neurology) [name], MD; Stop Abilify, Start gabapentin 300-600 mg QHS for headaches and sleep  11/25/2021 [facility] (Orthopedics) Magnant, Charles L, PA-C; no medication changes indicated.  10/15/2021 [facility] (Cardiology) [name], MD; START: CHLORTHALIDONE 25mg  ONCE DAILY   10/07/2021 [facility] (Orthopedics) [name], MD; no medication changes indicated.  09/23/2021 [facility] (Sports Medicine) [name], MD; no medication changes indicated.  09/16/2021 [facility] (Sports Medicine) [name], MD; no medication changes indicated.  09/09/2021 [facility] (Sports Medicine) [name], MD; no medication changes indicated.  Hospital visits:  11/16/2021 ED to Hospital Admission due to Acute ischemic stroke at [facility]  To reduce the chance of another stroke: Take the medicines aspirin and plavix. These "grease" platelets, so they don't stick together and cause blockages in the arteries. Take low dose aspirin 81 PLUS clopidogrel/Plavix 75 for 3 weeks  After three weeks, take aspirin 81 mg alone from now on  Medications: Outpatient Encounter Medications as of 02/13/2022  Medication Sig   acetaminophen  (TYLENOL) 325 MG tablet Take 2 tablets (650 mg total) by mouth every 4 (four) hours as needed for mild pain (or temp > 37.5 C (99.5 F)).   aspirin EC 81 MG EC tablet Take 1 tablet (81 mg total) by mouth daily. Swallow whole.   atorvastatin (LIPITOR) 80 MG tablet Take 1 tablet (80 mg total) by mouth daily.   chlorthalidone (HYGROTON) 25 MG tablet Take 1 tablet (25 mg total) by mouth daily.   clopidogrel (PLAVIX) 75 MG tablet Take 1 tablet (75 mg total) by mouth daily.   gabapentin (NEURONTIN) 300 MG capsule Take 1-2 pills nightly at bedtime   lidocaine (LIDODERM) 5 % PLACE 1 PATCH ONTO THE SKIN DAILY, REMOVE AFTER 12 HOURS OR AS DIRECTED BY MD   losartan (COZAAR) 50 MG tablet Take 1 tablet (50 mg total) by mouth daily.   magnesium gluconate (MAGONATE) 500 MG tablet Take 1 tablet (500 mg total) by mouth at bedtime.   nicotine (NICODERM CQ - DOSED IN MG/24 HOURS) 21 mg/24hr patch 21 mg patch daily x2 weeks then 14 mg patch daily x3 weeks then 7 mg patch daily x3 weeks and stop   No facility-administered encounter medications on file as of 02/13/2022.   Current Medications: Acetaminophen 325 mg Aspirin 81 mg Atorvastatin 80 mg - 40 mg last filled 01/10/2022 90 DS Chlorthalidone 25 mg - last filled 01/10/2022 90 DS Clopidogrel 75 mg - last filled 12/09/2021 90 DS Gabapentin 300 mg - last filled 12/31/2021 100 DS Lidocaine 5% Losartan 50 mg - last filled 01/10/2022 90 DS Magnesium Gluconate 500 mg - last filled 01/16/2022 30 DS Nicoderm CQ - last filled 01/16/2022 28 DS  Patient Questions: Have you seen any other providers since your last  visit?   Any changes in your medications or health?  Any side effects from any medications?   Do you have any symptoms or problems not managed by your medications?  Any concerns about your health right now?  Has your provider asked that you check blood pressure, blood sugar, or follow special diet at home?  Do you get any type of exercise on a regular  basis?  Can you think of a goal you would like to reach for your health?  Do you have any problems getting your medications?  Is there anything that you would like to discuss during the appointment?   Please bring medications and supplements to appointment  **Unsuccessful attempt at reaching patient to complete this call**  Care Gaps: Medicare Annual Wellness: Completed 02/11/2022 Hemoglobin A1C: 5.8% on 11/17/2021 Colonoscopy: Never done Dexa Scan: Completed Mammogram: Next due on 06/13/2023  Future Appointments  Date Time Provider Department Center  02/18/2022  1:30 PM [facility] CCM PHARMACIST [facility]  03/05/2022 11:00 AM Allwardt, Crist Infante, PA-C [facility]  05/13/2022 11:20 AM Raulkar, Drema Pry, MD [facility]   Star Rating Drugs:  April D Calhoun, [facility] Clinical Pharmacist Assistant [phone]

## 2022-02-17 NOTE — Progress Notes (Signed)
Chronic Care Management Pharmacy Note  02/19/2022 Name:  Veronica Weber MRN:  [id number] DOB:  06-05-1956  Summary: Initial PharmD visit.  Patient set up with delivery and synch from [facility].  She is working on getting LDL to goal and smoking cessation to reduce her CV risk.  No changes to meds needed at this time.  Recommendations/Changes made from today's visit: Recheck lipids at next OV   Plan: FU 6 months   Subjective: Veronica Weber is an 66 y.o. year old female who is a primary patient of Allwardt, Alyssa M, PA-C.  The CCM team was consulted for assistance with disease management and care coordination needs.    Engaged with patient face to face for initial visit in response to provider referral for pharmacy case management and/or care coordination services.   Consent to Services:  The patient was given the following information about Chronic Care Management services today, agreed to services, and gave verbal consent: 1. CCM service includes personalized support from designated clinical staff supervised by the primary care provider, including individualized plan of care and coordination with other care providers 2. 24/7 contact phone numbers for assistance for urgent and routine care needs. 3. Service will only be billed when office clinical staff spend 20 minutes or more in a month to coordinate care. 4. Only one practitioner may furnish and bill the service in a calendar month. 5.The patient may stop CCM services at any time (effective at the end of the month) by phone call to the office staff. 6. The patient will be responsible for cost sharing (co-pay) of up to 20% of the service fee (after annual deductible is met). Patient agreed to services and consent obtained.  Patient Care Team: Allwardt, Crist Infante, PA-C as PCP - General (Physician Assistant) [name], MD as PCP - Cardiology (Cardiology) Erroll Luna, [facility] as Pharmacist (Pharmacist)  Recent  office visits:  02/13/2022 OV (PCP) Allwardt, Crist Infante, PA-C;  no medication changes indicated.   12/16/2021 OV (PCP) Allwardt, Alyssa M, PA-C;  no medication changes indicated.   12/12/2021 OV (PCP) Allwardt, Alyssa M, PA-C;  no medication changes indicated.   12/05/2021 OV (PCP) Allwardt, Alyssa M, PA-C;  no medication changes indicated.   Recent consult visits:  01/24/2022 OV (Orthopedics) [name], MD; no medication changes indicated.   12/31/2021 OV (Neurology) [name], MD; Stop Abilify, Start gabapentin 300-600 mg QHS for headaches and sleep   11/25/2021 OV (Orthopedics) Magnant, Charles L, PA-C; no medication changes indicated.   10/15/2021 OV (Cardiology) [name], MD; START: CHLORTHALIDONE 25mg  ONCE DAILY    10/07/2021 OV (Orthopedics) [name], MD; no medication changes indicated.   09/23/2021 OV (Sports Medicine) [name], MD; no medication changes indicated.   09/16/2021 OV (Sports Medicine) [name], MD; no medication changes indicated.   09/09/2021 OV (Sports Medicine) [name], MD; no medication changes indicated.   Hospital visits:  11/16/2021 ED to Hospital Admission due to Acute ischemic stroke at [facility]  To reduce the chance of another stroke: Take the medicines aspirin and plavix. These "grease" platelets, so they don't stick together and cause blockages in the arteries. Take low dose aspirin 81 PLUS clopidogrel/Plavix 75 for 3 weeks  After three weeks, take aspirin 81 mg alone from now on   Objective:  Lab Results  Component Value Date   CREATININE 0.77 11/20/2021   BUN 12 11/20/2021   GFR 91.53 10/15/2020  EGFR 79 11/06/2021   GFRNONAA >60 11/20/2021   NA 136 11/20/2021   K 3.8 11/20/2021   CALCIUM 9.7 11/20/2021   CO2 27 11/20/2021   GLUCOSE 100 (H) 11/20/2021    Lab Results  Component Value Date/Time   HGBA1C 5.8 (H) 11/17/2021 02:01 AM   GFR 91.53 10/15/2020  11:42 AM    Last diabetic Eye exam: No results found for: "HMDIABEYEEXA"  Last diabetic Foot exam: No results found for: "HMDIABFOOTEX"   Lab Results  Component Value Date   CHOL 201 (H) 11/17/2021   HDL 55 11/17/2021   LDLCALC 120 (H) 11/17/2021   TRIG 128 11/17/2021   CHOLHDL 3.7 11/17/2021       Latest Ref Rng & Units 11/20/2021    6:24 AM 11/16/2021    2:39 PM 10/15/2020   11:42 AM  Hepatic Function  Total Protein 6.5 - 8.1 g/dL 6.1  6.7  7.2   Albumin 3.5 - 5.0 g/dL 3.5  3.8  4.0   AST 15 - 41 U/L 35  19  16   ALT 0 - 44 U/L 31  19  12    Alk Phosphatase 38 - 126 U/L 86  105  98   Total Bilirubin 0.3 - 1.2 mg/dL 0.7  0.5  0.3     Lab Results  Component Value Date/Time   TSH 3.21 10/15/2020 11:42 AM       Latest Ref Rng & Units 11/20/2021    6:24 AM 11/17/2021    2:01 AM 11/16/2021    2:39 PM  CBC  WBC 4.0 - 10.5 K/uL 6.0  7.8  7.6   Hemoglobin 12.0 - 15.0 g/dL 60.4  54.0  98.1   Hematocrit 36.0 - 46.0 % 39.8  40.0  41.7   Platelets 150 - 400 K/uL 270  291  324     No results found for: "VD25OH"  Clinical ASCVD: Yes  The ASCVD Risk score (Arnett DK, et al., 2019) failed to calculate for the following reasons:   The patient has a prior MI or stroke diagnosis       12/16/2021   11:06 AM 12/05/2021    1:19 PM 05/27/2021   10:56 AM  Depression screen PHQ 2/9  Decreased Interest 0 0 0  Down, Depressed, Hopeless 0 0 0  PHQ - 2 Score 0 0 0      Social History   Tobacco Use  Smoking Status Every Day   Packs/day: 0.50   Years: 45.00   Total pack years: 22.50   Types: Cigarettes   Start date: 06/01/1976  Smokeless Tobacco Never   BP Readings from Last 3 Encounters:  01/16/22 109/76  12/31/21 108/65  12/16/21 119/78   Pulse Readings from Last 3 Encounters:  01/16/22 77  12/31/21 71  12/16/21 62   Wt Readings from Last 3 Encounters:  01/24/22 201 lb (91.2 kg)  01/16/22 201 lb (91.2 kg)  12/31/21 200 lb (90.7 kg)   BMI Readings from Last 3  Encounters:  01/24/22 39.26 kg/m  01/16/22 39.26 kg/m  12/31/21 39.06 kg/m    Assessment/Interventions: Review of patient past medical history, allergies, medications, health status, including review of consultants reports, laboratory and other test data, was performed as part of comprehensive evaluation and provision of chronic care management services.   SDOH:  (Social Determinants of Health) assessments and interventions performed: Yes  Financial Resource Strain: Low Risk  (02/19/2022)   Overall Financial Resource Strain (CARDIA)    Difficulty of  Paying Living Expenses: Not very hard   Food Insecurity: No Food Insecurity (02/19/2022)   Hunger Vital Sign    Worried About Running Out of Food in the Last Year: Never true    Ran Out of Food in the Last Year: Never true     SDOH Screenings   Alcohol Screen: Not on file  Depression (PHQ2-9): Low Risk  (12/16/2021)   Depression (PHQ2-9)    PHQ-2 Score: 0  Financial Resource Strain: Low Risk  (02/19/2022)   Overall Financial Resource Strain (CARDIA)    Difficulty of Paying Living Expenses: Not very hard  Food Insecurity: No Food Insecurity (02/19/2022)   Hunger Vital Sign    Worried About Running Out of Food in the Last Year: Never true    Ran Out of Food in the Last Year: Never true  Housing: Not on file  Physical Activity: Not on file  Social Connections: Not on file  Stress: Not on file  Tobacco Use: High Risk (01/24/2022)   Patient History    Smoking Tobacco Use: Every Day    Smokeless Tobacco Use: Never    Passive Exposure: Not on file  Transportation Needs: Not on file    CCM Care Plan  No Known Allergies  Medications Reviewed Today     Reviewed by Erroll Luna, [facility] (Pharmacist) on 02/19/22 at [phone]  Med List Status: <None>   Medication Order Taking? Sig Documenting Provider Last Dose Status Informant  acetaminophen (TYLENOL) 325 MG tablet [id number] Yes Take 2 tablets (650 mg total) by mouth every 4 (four)  hours as needed for mild pain (or temp > 37.5 C (99.5 F)). Charlton Amor, PA-C Taking Active   aspirin EC 81 MG EC tablet [id number] Yes Take 1 tablet (81 mg total) by mouth daily. Swallow whole. Charlton Amor, PA-C Taking Active   atorvastatin (LIPITOR) 80 MG tablet [id number] Yes Take 1 tablet (80 mg total) by mouth daily. Charlton Amor, PA-C Taking Active   chlorthalidone (HYGROTON) 25 MG tablet [id number] Yes Take 1 tablet (25 mg total) by mouth daily. Allwardt, Crist Infante, PA-C Taking Active   clopidogrel (PLAVIX) 75 MG tablet [id number] Yes Take 1 tablet (75 mg total) by mouth daily. Allwardt, Crist Infante, PA-C Taking Active   gabapentin (NEURONTIN) 300 MG capsule [id number] Yes Take 1-2 pills nightly at bedtime [name], MD Taking Active   lidocaine (LIDODERM) 5 % [id number] Yes PLACE 1 PATCH ONTO THE SKIN DAILY, REMOVE AFTER 12 HOURS OR AS DIRECTED BY MD [name], MD Taking Active   losartan (COZAAR) 50 MG tablet [id number] Yes Take 1 tablet (50 mg total) by mouth daily. 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   Essential hypertension 11/16/2021   Hyperlipidemia 11/16/2021   Tobacco use 11/16/2021   Syncope and collapse 06/26/2021    Immunization History  Administered Date(s) Administered  Influenza-Unspecified 05/13/2021   Moderna Sars-Covid-2 Vaccination 11/14/2019, 12/12/2019   PNEUMOCOCCAL CONJUGATE-20  11/17/2021    Conditions to be addressed/monitored:  HTN, Hx of CVA/Stroke, HLD, Tobacco Use  Care Plan : General Pharmacy (Adult)  Updates made by Erroll Luna, RPH since 02/19/2022 12:00 AM     Problem: HTN, HLD, Tobacco use   Priority: High  Onset Date: 02/19/2022     Long-Range Goal: Patient-Specific Goal   Start Date: 02/19/2022  Expected End Date: 08/21/2022  This Visit's Progress: On track  Priority: High  Note:   Current Barriers:  Unable to achieve control of LDL  Medication delivery concerns  Pharmacist Clinical Goal(s):  Patient will achieve control of LDL as evidenced by lans through collaboration with PharmD and provider.   Interventions: 1:1 collaboration with Allwardt, Crist Infante, PA-C regarding development and update of comprehensive plan of care as evidenced by provider attestation and co-signature Inter-disciplinary care team collaboration (see longitudinal plan of care) Comprehensive medication review performed; medication list updated in electronic medical record  Hypertension (BP goal <130/80) -Controlled -Current treatment: Losartan 50mg  Appropriate, Effective, Safe, Accessible Chlorthalidone 12.5mg  daily Appropriate, Effective, Safe, Accessible -Medications previously tried: none noted  -Current home readings: 120/75, 132/76 -Current dietary habits: trying to watch what she eats, hard right now because of her loose fitting dentures.  Eating more jello, potatoes, etc.  Also mentioned TV dinner last night for dinner -Current exercise habits: minimal due to knee pain -Denies hypotensive/hypertensive symptoms -Educated on BP goals and benefits of medications for prevention of heart attack, stroke and kidney damage; Exercise goal of 150 minutes per week; Importance of home blood pressure monitoring; Proper BP monitoring technique; Symptoms of hypotension and importance of maintaining adequate hydration; -Counseled to monitor BP at home a few times  per week, document, and provide log at future appointments -Recommended to continue current medication Continue to work on lifestyle mods, limit salt intake  Hyperlipidemia: (LDL goal < 70) -Uncontrolled -Current treatment: Atorvastatin 80mg  Appropriate, Query effective, ,  -Medications previously tried: atorvastatin 40mg   -Current dietary patterns: see HTN -Current exercise habits: see HTN -Educated on Cholesterol goals;  Benefits of statin for ASCVD risk reduction; Importance of limiting foods high in cholesterol; -Recommended to continue current medication Recommend repeat lipid panel after 80mg  at next OV.  Set her up with [facility] pill packs for increased adherence, verified she was taking correct 80mg  dose because last filled strength at pharmacy was 40mg .  She is aware to be taking the 80mg  dose.  Hx of CVA (Goal: Reduce risk) -Controlled -Current treatment  Clopidogrel 75mg  daily Appropriate, Effective, Safe, Accessible -Medications previously tried: none noted -Supposed to switch to ASA 81mg  after this refill of Plavix is completed.  Denies any abnormal bleeding. -Trying to control risk factors, LDL above goal, BP controlled, she is working on smoking cessation  -Recommended to continue current medication Switch to ASA 81mg  as directed, continue working to bring LDL to goal < 70.  Tobacco use (Goal Smoking Cessation) -Not ideally controlled -Previous quit attempts: yes -Current treatment  Nicotine 14mg /24hr patch Appropriate, Effective, Safe, Accessible -Patient smokes After 30 minutes of waking -Patient triggers include: walking the dog, after dinner  -Counseled on patch placement, side effects, and option to remove at night if they experience trouble sleeping or bad dreams.  Counseled to allow lozenge to dissolve and absorb in cheek pocket, rather than swallow, to reduce GI side effects. -Recommended continue current efforts, call me if she needs support.  She has  cut  back substantially and reports only smoking 3-4 cigs per day.  Still motivated to quit.  Patient Goals/Self-Care Activities Patient will:  - focus on medication adherence by pill packs check blood pressure a few times per week, document, and provide at future appointments  Follow Up Plan: The care management team will reach out to the patient again over the next 180 days.       Medication Assistance: None required.  Patient affirms current coverage meets needs.  Compliance/Adherence/Medication fill history: Care Gaps: Medicare Annual Wellness: Completed 02/11/2022 Hemoglobin A1C: 5.8% on 11/17/2021 Colonoscopy: Never done Dexa Scan: Completed Mammogram: Next due on 06/13/2023  Star-Rating Drugs: Atorvastatin 40mg  01/10/22 90ds Losartan 50mg  01/10/22 90ds  Patient's preferred pharmacy is:  [id number] - [place], [place] - [address] [place] [address] Phone: [phone] Fax: [phone]  Uses pill box? Yes Pt endorses 100% compliance  We discussed: Benefits of medication synchronization, packaging and delivery as well as enhanced pharmacist oversight with [facility]. Patient decided to: Continue current medication management strategy  Care Plan and Follow Up Patient Decision:  Patient agrees to Care Plan and Follow-up.  Plan: The care management team will reach out to the patient again over the next 180 days.  Willa Frater, PharmD Clinical Pharmacist  [facility] [phone]

## 2022-02-18 ENCOUNTER — Ambulatory Visit (INDEPENDENT_AMBULATORY_CARE_PROVIDER_SITE_OTHER): Payer: Medicare Other | Admitting: Pharmacist

## 2022-02-18 DIAGNOSIS — I10 Essential (primary) hypertension: Secondary | ICD-10-CM

## 2022-02-18 DIAGNOSIS — Z720 Tobacco use: Secondary | ICD-10-CM

## 2022-02-18 DIAGNOSIS — E7800 Pure hypercholesterolemia, unspecified: Secondary | ICD-10-CM

## 2022-02-19 ENCOUNTER — Other Ambulatory Visit: Payer: Self-pay

## 2022-02-19 MED ORDER — LOSARTAN POTASSIUM 50 MG PO TABS: 50.0000 mg | ORAL_TABLET | Freq: Every day | ORAL | 0 refills | Status: DC

## 2022-02-19 MED ORDER — GABAPENTIN 300 MG PO CAPS: ORAL_CAPSULE | ORAL | 1 refills | Status: DC

## 2022-02-19 MED ORDER — ATORVASTATIN CALCIUM 80 MG PO TABS
80.0000 mg | ORAL_TABLET | Freq: Every day | ORAL | 0 refills | Status: DC
Start: 2022-02-19 — End: 2022-03-05

## 2022-02-19 MED ORDER — CHLORTHALIDONE 25 MG PO TABS: 25.0000 mg | ORAL_TABLET | Freq: Every day | ORAL | 0 refills | Status: DC

## 2022-02-19 NOTE — Patient Instructions (Addendum)
Visit Information   Goals Addressed             This Visit's Progress    LDL < 100       Timeframe:  Long-Range Goal Priority:  High Start Date: 02/18/22                            Expected End Date:   08/20/22                    Follow Up Date 05/21/22    Take atorvastatin 80mg  - repeat LDL next OV    Why is this important?   These steps will help you keep on track with your medicines.   Notes: Working on dietary/lifestyle changes       Patient Care Plan: General Pharmacy (Adult)     Problem Identified: HTN, HLD, Tobacco use   Priority: High  Onset Date: 02/19/2022     Long-Range Goal: Patient-Specific Goal   Start Date: 02/19/2022  Expected End Date: 08/21/2022  This Visit's Progress: On track  Priority: High  Note:   Current Barriers:  Unable to achieve control of LDL  Medication delivery concerns  Pharmacist Clinical Goal(s):  Patient will achieve control of LDL as evidenced by lans through collaboration with PharmD and provider.   Interventions: 1:1 collaboration with Allwardt, 08/23/2022, PA-C regarding development and update of comprehensive plan of care as evidenced by provider attestation and co-signature Inter-disciplinary care team collaboration (see longitudinal plan of care) Comprehensive medication review performed; medication list updated in electronic medical record  Hypertension (BP goal <130/80) -Controlled -Current treatment: Losartan 50mg  Appropriate, Effective, Safe, Accessible Chlorthalidone 12.5mg  daily Appropriate, Effective, Safe, Accessible -Medications previously tried: none noted  -Current home readings: 120/75, 132/76 -Current dietary habits: trying to watch what she eats, hard right now because of her loose fitting dentures.  Eating more jello, potatoes, etc.  Also mentioned TV dinner last night for dinner -Current exercise habits: minimal due to knee pain -Denies hypotensive/hypertensive symptoms -Educated on BP goals and  benefits of medications for prevention of heart attack, stroke and kidney damage; Exercise goal of 150 minutes per week; Importance of home blood pressure monitoring; Proper BP monitoring technique; Symptoms of hypotension and importance of maintaining adequate hydration; -Counseled to monitor BP at home a few times per week, document, and provide log at future appointments -Recommended to continue current medication Continue to work on lifestyle mods, limit salt intake  Hyperlipidemia: (LDL goal < 70) -Uncontrolled -Current treatment: Atorvastatin 80mg  Appropriate, Query effective, ,  -Medications previously tried: atorvastatin 40mg   -Current dietary patterns: see HTN -Current exercise habits: see HTN -Educated on Cholesterol goals;  Benefits of statin for ASCVD risk reduction; Importance of limiting foods high in cholesterol; -Recommended to continue current medication Recommend repeat lipid panel after 80mg  at next OV.  Set her up with [facility] pill packs for increased adherence, verified she was taking correct 80mg  dose because last filled strength at pharmacy was 40mg .  She is aware to be taking the 80mg  dose.  Hx of CVA (Goal: Reduce risk) -Controlled -Current treatment  Clopidogrel 75mg  daily Appropriate, Effective, Safe, Accessible -Medications previously tried: none noted -Supposed to switch to ASA 81mg  after this refill of Plavix is completed.  Denies any abnormal bleeding. -Trying to control risk factors, LDL above goal, BP controlled, she is working on smoking cessation  -Recommended to continue current medication Switch to ASA 81mg  as directed,  continue working to bring LDL to goal < 70.  Tobacco use (Goal Smoking Cessation) -Not ideally controlled -Previous quit attempts: yes -Current treatment  Nicotine 14mg /24hr patch Appropriate, Effective, Safe, Accessible -Patient smokes After 30 minutes of waking -Patient triggers include: walking the dog, after  dinner  -Counseled on patch placement, side effects, and option to remove at night if they experience trouble sleeping or bad dreams.  Counseled to allow lozenge to dissolve and absorb in cheek pocket, rather than swallow, to reduce GI side effects. -Recommended continue current efforts, call me if she needs support.  She has cut back substantially and reports only smoking 3-4 cigs per day.  Still motivated to quit.  Patient Goals/Self-Care Activities Patient will:  - focus on medication adherence by pill packs check blood pressure a few times per week, document, and provide at future appointments  Follow Up Plan: The care management team will reach out to the patient again over the next 180 days.      Ms. Rengel was given information about Chronic Care Management services today including:  CCM service includes personalized support from designated clinical staff supervised by her physician, including individualized plan of care and coordination with other care providers 24/7 contact phone numbers for assistance for urgent and routine care needs. Standard insurance, coinsurance, copays and deductibles apply for chronic care management only during months in which we provide at least 20 minutes of these services. Most insurances cover these services at 100%, however patients may be responsible for any copay, coinsurance and/or deductible if applicable. This service may help you avoid the need for more expensive face-to-face services. Only one practitioner may furnish and bill the service in a calendar month. The patient may stop CCM services at any time (effective at the end of the month) by phone call to the office staff.  Patient agreed to services and verbal consent obtained.   The patient verbalized understanding of instructions, educational materials, and care plan provided today and agreed to receive a mailed copy of patient instructions, educational materials, and care plan.   Telephone follow up appointment with pharmacy team member scheduled for: 6 months  Roswell Nickel, [facility]  [facility], PharmD Clinical Pharmacist  [facility] [phone]

## 2022-02-28 DIAGNOSIS — I10 Essential (primary) hypertension: Secondary | ICD-10-CM

## 2022-02-28 DIAGNOSIS — F17210 Nicotine dependence, cigarettes, uncomplicated: Secondary | ICD-10-CM

## 2022-02-28 DIAGNOSIS — E785 Hyperlipidemia, unspecified: Secondary | ICD-10-CM | POA: Diagnosis not present

## 2022-03-05 ENCOUNTER — Encounter: Payer: Self-pay | Admitting: Physician Assistant

## 2022-03-05 ENCOUNTER — Ambulatory Visit (INDEPENDENT_AMBULATORY_CARE_PROVIDER_SITE_OTHER): Payer: Medicare Other | Admitting: Physician Assistant

## 2022-03-05 VITALS — BP 128/78 | HR 88 | Temp 97.5°F | Resp 16 | Ht 60.0 in | Wt 202.6 lb

## 2022-03-05 DIAGNOSIS — F17200 Nicotine dependence, unspecified, uncomplicated: Secondary | ICD-10-CM

## 2022-03-05 DIAGNOSIS — R7303 Prediabetes: Secondary | ICD-10-CM

## 2022-03-05 DIAGNOSIS — M170 Bilateral primary osteoarthritis of knee: Secondary | ICD-10-CM

## 2022-03-05 DIAGNOSIS — I10 Essential (primary) hypertension: Secondary | ICD-10-CM

## 2022-03-05 DIAGNOSIS — E782 Mixed hyperlipidemia: Secondary | ICD-10-CM

## 2022-03-05 DIAGNOSIS — M79671 Pain in right foot: Secondary | ICD-10-CM

## 2022-03-05 DIAGNOSIS — Z01818 Encounter for other preprocedural examination: Secondary | ICD-10-CM | POA: Diagnosis not present

## 2022-03-05 DIAGNOSIS — M79672 Pain in left foot: Secondary | ICD-10-CM

## 2022-03-05 MED ORDER — NICOTINE 14 MG/24HR TD PT24
14.0000 mg | MEDICATED_PATCH | Freq: Every day | TRANSDERMAL | 0 refills | Status: DC
Start: 2022-03-05 — End: 2022-05-13

## 2022-03-05 MED ORDER — ATORVASTATIN CALCIUM 80 MG PO TABS: 80.0000 mg | ORAL_TABLET | Freq: Every day | ORAL | 0 refills | Status: DC

## 2022-03-05 MED ORDER — CLOPIDOGREL BISULFATE 75 MG PO TABS: 75.0000 mg | ORAL_TABLET | Freq: Every day | ORAL | 0 refills | Status: DC

## 2022-03-05 MED ORDER — CHLORTHALIDONE 25 MG PO TABS: 25.0000 mg | ORAL_TABLET | Freq: Every day | ORAL | 0 refills | Status: DC

## 2022-03-05 MED ORDER — MAGNESIUM GLUCONATE 500 MG PO TABS: 500.0000 mg | ORAL_TABLET | Freq: Every evening | ORAL | 0 refills | Status: AC

## 2022-03-05 MED ORDER — LOSARTAN POTASSIUM 50 MG PO TABS: 50.0000 mg | ORAL_TABLET | Freq: Every day | ORAL | 0 refills | Status: DC

## 2022-03-05 NOTE — Patient Instructions (Addendum)
Work note provided clearing for sit-breaks through Nov 2023 Schedule for fasting labs the beginning of Sept 2023 Follow-up with me end of Nov 2023  Keep working on health goals!!!   Call sooner if concerns

## 2022-03-05 NOTE — Progress Notes (Signed)
Subjective:    Patient ID: Veronica Weber, female    DOB: Aug 31, 1956, 66 y.o.   MRN: [id number]  Chief Complaint  Patient presents with   fasting labs   Weight Check    Jan 201 lbs Today 202.6 lbs     Nicotine Dependence    1-2 packs per week     HPI Patient is in today for regular f/up  Concerns about needing new work note - needs frequent sit-breaks and her company is giving her a hard time about this -  Sept 21 is her left knee surgery with Dr. August Saucer. Will likely need medical clearance.  Right foot pain worse than left. Heel pain, worse in the mornings, likely PF. She has a podiatrist.   HTN - Blood pressure running in 112s-120s/ 70s-80s. Taking losartan 50 mg daily and chlorthalidone 25 mg 1/2 tab daily   Smoking -  She's having a hard time keeping nicotine patches on her skin. Currently on 14 mg dose patches. Trying to crochet to keep busy. Still smoking about 3-4 cigs/ day.   No CP. No SOB. No dizziness. No weakness in extremities. Mild headache when she doesn't take magnesium. Walks her dogs daily and denies any CP or SOB with exertion. Has to sit frequently at work due to arthritis pain in knees and feet.   Needing refills on all medications.   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   Hyperlipidemia    Hypertension    Thyroid disease    Tobacco use     Past Surgical History:  Procedure Laterality Date   CATARACT EXTRACTION     1997, 1998   CESAREAN SECTION  1983   CHOLECYSTECTOMY  1998   TUBAL LIGATION  1994    Family History  Problem Relation Age of Onset   Arthritis Mother    Cancer Maternal Grandmother    Diabetes Maternal Grandmother    Diabetes Maternal Grandfather    Healthy Daughter    Healthy Son     Social History   Tobacco Use   Smoking status: Every Day    Packs/day: 0.50    Years: 45.00    Total pack years: 22.50    Types: Cigarettes    Start date:  06/01/1976   Smokeless tobacco: Never  Vaping Use   Vaping Use: Never used  Substance Use Topics   Alcohol use: Not Currently   Drug use: Never     No Known Allergies  Review of Systems NEGATIVE UNLESS OTHERWISE INDICATED IN HPI      Objective:     BP 128/78   Pulse 88   Temp (!) 97.5 F (36.4 C) (Temporal)   Resp 16   Ht 5' (1.524 m)   Wt 202 lb 9.6 oz (91.9 kg)   SpO2 97%   BMI 39.57 kg/m   Wt Readings from Last 3 Encounters:  03/05/22 202 lb 9.6 oz (91.9 kg)  01/24/22 201 lb (91.2 kg)  01/16/22 201 lb (91.2 kg)    BP Readings from Last 3 Encounters:  03/05/22 128/78  01/16/22 109/76  12/31/21 108/65     Physical Exam Constitutional:      Appearance: Normal appearance. She is obese.  Cardiovascular:     Rate and Rhythm: Normal rate and regular rhythm.  Pulmonary:     Effort: Pulmonary effort is normal.     Breath sounds: Normal breath  sounds.  Neurological:     General: No focal deficit present.     Mental Status: She is alert and oriented to person, place, and time.  Psychiatric:        Mood and Affect: Mood normal.        Behavior: Behavior normal.        Assessment & Plan:   Problem List Items Addressed This Visit       Cardiovascular and Mediastinum   Essential hypertension   Relevant Medications   atorvastatin (LIPITOR) 80 MG tablet   chlorthalidone (HYGROTON) 25 MG tablet   losartan (COZAAR) 50 MG tablet   Other Relevant Orders   CBC with Differential/Platelet   Comprehensive metabolic panel     Other   Hyperlipidemia   Relevant Medications   atorvastatin (LIPITOR) 80 MG tablet   chlorthalidone (HYGROTON) 25 MG tablet   losartan (COZAAR) 50 MG tablet   Other Relevant Orders   Lipid panel   Current every day smoker   Other Visit Diagnoses     Preop examination    -  Primary   Relevant Orders   CBC with Differential/Platelet   Comprehensive metabolic panel   Lipid panel   Hemoglobin A1c   Osteoarthritis of both knees,  unspecified osteoarthritis type       Bilateral foot pain       Prediabetes       Relevant Orders   Comprehensive metabolic panel   Hemoglobin A1c        Meds ordered this encounter  Medications   nicotine (NICODERM CQ - DOSED IN MG/24 HOURS) 14 mg/24hr patch    Sig: Place 1 patch (14 mg total) onto the skin daily.    Dispense:  28 patch    Refill:  0    Order Specific Question:   Supervising Provider    Answer:   Shelva Majestic [[id number]]   atorvastatin (LIPITOR) 80 MG tablet    Sig: Take 1 tablet (80 mg total) by mouth daily.    Dispense:  90 tablet    Refill:  0    Order Specific Question:   Supervising Provider    Answer:   Shelva Majestic [[id number]]   chlorthalidone (HYGROTON) 25 MG tablet    Sig: Take 1 tablet (25 mg total) by mouth daily. Take 1/2 tablet by mouth daily.    Dispense:  90 tablet    Refill:  0    Order Specific Question:   Supervising Provider    Answer:   Shelva Majestic [[id number]]   clopidogrel (PLAVIX) 75 MG tablet    Sig: Take 1 tablet (75 mg total) by mouth daily.    Dispense:  90 tablet    Refill:  0    Order Specific Question:   Supervising Provider    Answer:   Shelva Majestic [[id number]]   losartan (COZAAR) 50 MG tablet    Sig: Take 1 tablet (50 mg total) by mouth daily.    Dispense:  90 tablet    Refill:  0    Order Specific Question:   Supervising Provider    Answer:   Shelva Majestic [[id number]]   magnesium gluconate (MAGONATE) 500 MG tablet    Sig: Take 1 tablet (500 mg total) by mouth every evening.    Dispense:  90 tablet    Refill:  0    Order Specific Question:   Supervising Provider    Answer:   Shelva Majestic [phone]  1. Preop examination Scheduled to have total left knee replacement with Dr. August Saucer 05/22/22 She is working on lifestyle changes such as quitting smoking and monitoring BP  Plan to update fasting labs within 30 days of surgery - order placed today She needs to stop Plavix as she is now >90 days out from her CVA  RCRI  score:  Cerebrovascular Disease (TIA or CVA): 1 Point - Class II Low (0.9% complications)  2. Osteoarthritis of both knees, unspecified osteoarthritis type 3. Bilateral foot pain Letter written for work excusing her for frequent sit breaks  4. Essential hypertension BP is stable Cont to monitor at home Cont on chlorthalidone 25 mg 1/2 tab daily, losartan 50 mg daily  5. Current every day smoker Nicoderm 14 mg daily patch  6. Mixed hyperlipidemia Lipitor 80 mg daily   7. Prediabetes Lab Results  Component Value Date   HGBA1C 5.8 (H) 11/17/2021   Plan to update this with labs in Sept Cont to work on lifestyle changes   Return in about 2 months (around 05/08/2022) for fasting labs - lab visit only.  This note was prepared with assistance of Conservation officer, historic buildings. Occasional wrong-word or sound-a-like substitutions may have occurred due to the inherent limitations of voice recognition software.   Treena Cosman M Steffi Noviello, PA-C

## 2022-03-06 ENCOUNTER — Other Ambulatory Visit: Payer: Self-pay | Admitting: Physician Assistant

## 2022-03-10 ENCOUNTER — Other Ambulatory Visit: Payer: Self-pay | Admitting: Psychiatry

## 2022-03-12 ENCOUNTER — Telehealth: Payer: Self-pay

## 2022-03-12 ENCOUNTER — Other Ambulatory Visit: Payer: Self-pay

## 2022-03-12 NOTE — Telephone Encounter (Signed)
-----   Message from Bary Leriche, PA-C sent at 03/05/2022  9:50 PM EDT ----- Regarding: Plavix Patient is >3 months out from her stroke. She can stop the Plavix and continue on ASA 81 mg daily only. Please make sure [facility] is aware to d/c this medication as well.

## 2022-03-12 NOTE — Telephone Encounter (Signed)
LVM for patient to return my call; needing to advise discontinue Plavix per Alyssa recommendations in staff msg.

## 2022-03-25 ENCOUNTER — Other Ambulatory Visit: Payer: Self-pay

## 2022-04-23 ENCOUNTER — Telehealth: Payer: Self-pay | Admitting: Physician Assistant

## 2022-04-23 NOTE — Telephone Encounter (Signed)
..  Type of form received: FMLA  Additional comments:   Received by: Christie  Form should be Faxed to: [phone]  Form should be mailed to:    Is patient requesting call for pickup:   Form placed:  In provider's box   Attach charge sheet. yes  Individual made aware of 3-5 business day turn around (Y/N)?   

## 2022-04-28 ENCOUNTER — Ambulatory Visit (INDEPENDENT_AMBULATORY_CARE_PROVIDER_SITE_OTHER): Payer: Medicare Other

## 2022-04-28 ENCOUNTER — Ambulatory Visit: Payer: Medicare Other | Admitting: Podiatry

## 2022-04-28 ENCOUNTER — Ambulatory Visit: Payer: Medicare Other | Admitting: Family Medicine

## 2022-04-28 ENCOUNTER — Telehealth: Payer: Self-pay | Admitting: Orthopedic Surgery

## 2022-04-28 DIAGNOSIS — M722 Plantar fascial fibromatosis: Secondary | ICD-10-CM

## 2022-04-28 DIAGNOSIS — M7661 Achilles tendinitis, right leg: Secondary | ICD-10-CM

## 2022-04-28 DIAGNOSIS — R52 Pain, unspecified: Secondary | ICD-10-CM

## 2022-04-28 MED ORDER — MELOXICAM 15 MG PO TABS: 15.0000 mg | ORAL_TABLET | Freq: Every day | ORAL | 1 refills | Status: DC

## 2022-04-28 MED ORDER — METHYLPREDNISOLONE 4 MG PO TBPK: ORAL_TABLET | ORAL | 0 refills | Status: DC

## 2022-04-28 MED ORDER — BETAMETHASONE SOD PHOS & ACET 6 (3-3) MG/ML IJ SUSP
3.0000 mg | Freq: Once | INTRAMUSCULAR | Status: AC
  Administered 2022-04-28: 3 mg via INTRA_ARTICULAR

## 2022-04-28 NOTE — Progress Notes (Signed)
[place] [id number] both    Chief Complaint  Patient presents with   Plantar Fasciitis    Patient is here for plantar fasciitis.    Subjective: 66 y.o. female presenting today for new complaint of pain and tenderness to the bilateral heels.  Patient states that for the last few months she has had increasing pain to the bilateral heels.  She denies a history of injury or any change in activity.  She has not done anything currently for treatment.  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  Dermatology: Skin is warm, dry and supple bilateral lower extremities. Negative for open lesions or macerations bilateral.   Vascular: Dorsalis Pedis and Posterior Tibial pulses palpable bilateral.  Capillary fill time is immediate to all digits.  Neurological: Epicritic and protective threshold intact bilateral.   Musculoskeletal: Tenderness to palpation to the plantar aspect of the left heel along the plantar fascia as well as the posterior tubercle of the calcaneus right. All other joints range of motion within normal limits bilateral. Strength 5/5 in all groups bilateral.   Radiographic exam B/L feet 04/23/2022: Normal osseous mineralization. Joint spaces preserved. No fracture/dislocation/boney destruction. No other soft tissue abnormalities or radiopaque foreign bodies.  Posterior and plantar heel spurs are noted bilateral on lateral view  Assessment: 1. Plantar fasciitis left foot 2.  Insertional Achilles tendinitis  right 3.  Posterior and plantar heel spurs bilateral  Plan of Care:  1. Patient evaluated. Xrays reviewed.   2. Injection of 0.5cc Celestone soluspan injected into the left plantar fascia and along the posterior tubercle of the calcaneus right.  3. Rx for Medrol Dose Pak placed 4. Rx for Meloxicam ordered for patient. 5.  Recommend good supportive shoes and sneakers 6. Instructed patient regarding therapies and modalities at home to alleviate symptoms.  7. Return to clinic in 4 weeks.     Felecia Shelling, DPM [facility]  Dr. Felecia Shelling, DPM    [address] [place], [place] [address]                Office [phone]  Fax [phone]

## 2022-04-28 NOTE — Progress Notes (Deleted)
   I, Philbert Riser, LAT, ATC acting as a scribe for Veronica Graham, MD.  Veronica Weber is a 66 y.o. female who presents to [facility] today for bilat foot pain. Pt was last seen by Dr. Denyse Weber on 09/23/21 and completed the Gelsyn series in bilat knees. Of note, pt was admitted to the hospital on 3/18 for a stroke. Pt had a surgical consultation w/ Dr. August Saucer on 01/24/22 and is scheduled for a L TKR on 9/21. Today, pt c/o bilat foot pain x /. Pt locates pain to    Pertinent review of systems: ***  Relevant historical information: ***   Exam:  There were no vitals taken for this visit. General: Well Developed, well nourished, and in no acute distress.   MSK: ***    Lab and Radiology Results No results found for this or any previous visit (from the past 72 hour(s)). No results found.     Assessment and Plan: 66 y.o. female with ***   PDMP not reviewed this encounter. No orders of the defined types were placed in this encounter.  No orders of the defined types were placed in this encounter.    Discussed warning signs or symptoms. Please see discharge instructions. Patient expresses understanding.   ***

## 2022-04-28 NOTE — Telephone Encounter (Signed)
[employer] forms received. To [employer]. 

## 2022-05-02 NOTE — Telephone Encounter (Signed)
Called pt to discuss FMLA paperwork in more detail since surgery isn't scheduled until 05/22/22. Pt advised still working and will be going out of work on 05/16/22. FMLA paperwork was sent to surgeons office and pt has already paid for them to complete it there. Advised pt I would hold on to what I received here in the office just incase it was needed. Pt agreed and verbalized understanding

## 2022-05-02 NOTE — Telephone Encounter (Signed)
type of form received: fmla  Additional comments:  Duplicate?  Received by: [facility]  Form should be Faxed to: [phone]   Form placed:   In provider's box  Attach charge sheet.  Y  Individual made aware of 3-5 business day turn around (Y/N)?  N/a

## 2022-05-06 ENCOUNTER — Other Ambulatory Visit: Payer: Medicare Other

## 2022-05-06 ENCOUNTER — Other Ambulatory Visit (INDEPENDENT_AMBULATORY_CARE_PROVIDER_SITE_OTHER): Payer: Medicare Other

## 2022-05-06 DIAGNOSIS — E782 Mixed hyperlipidemia: Secondary | ICD-10-CM | POA: Diagnosis not present

## 2022-05-06 DIAGNOSIS — I10 Essential (primary) hypertension: Secondary | ICD-10-CM

## 2022-05-06 DIAGNOSIS — Z01818 Encounter for other preprocedural examination: Secondary | ICD-10-CM | POA: Diagnosis not present

## 2022-05-06 DIAGNOSIS — R7303 Prediabetes: Secondary | ICD-10-CM | POA: Diagnosis not present

## 2022-05-06 LAB — COMPREHENSIVE METABOLIC PANEL
ALT: 20 U/L (ref 0–35)
AST: 16 U/L (ref 0–37)
Albumin: 4 g/dL (ref 3.5–5.2)
Alkaline Phosphatase: 99 U/L (ref 39–117)
BUN: 21 mg/dL (ref 6–23)
CO2: 31 mEq/L (ref 19–32)
Calcium: 10 mg/dL (ref 8.4–10.5)
Chloride: 104 mEq/L (ref 96–112)
Creatinine, Ser: 0.87 mg/dL (ref 0.40–1.20)
GFR: 69.5 mL/min (ref >60.00)
Glucose, Bld: 83 mg/dL (ref 70–99)
Potassium: 3.8 mEq/L (ref 3.5–5.1)
Sodium: 141 mEq/L (ref 135–145)
Total Bilirubin: 0.6 mg/dL (ref 0.2–1.2)
Total Protein: 6.7 g/dL (ref 6.0–8.3)

## 2022-05-06 LAB — CBC WITH DIFFERENTIAL/PLATELET
Basophils Absolute: 0.1 10*3/uL (ref 0.0–0.1)
Basophils Relative: 0.8 % (ref 0.0–3.0)
Eosinophils Absolute: 0.3 10*3/uL (ref 0.0–0.7)
Eosinophils Relative: 3.4 % (ref 0.0–5.0)
HCT: 40.1 % (ref 36.0–46.0)
Hemoglobin: 13.7 g/dL (ref 12.0–15.0)
Lymphocytes Relative: 33.2 % (ref 12.0–46.0)
Lymphs Abs: 3.3 10*3/uL (ref 0.7–4.0)
MCHC: 34.1 g/dL (ref 30.0–36.0)
MCV: 94 fl (ref 78.0–100.0)
Monocytes Absolute: 0.7 10*3/uL (ref 0.1–1.0)
Monocytes Relative: 6.9 % (ref 3.0–12.0)
Neutro Abs: 5.5 10*3/uL (ref 1.4–7.7)
Neutrophils Relative %: 55.7 % (ref 43.0–77.0)
Platelets: 316 10*3/uL (ref 150.0–400.0)
RBC: 4.26 Mil/uL (ref 3.87–5.11)
RDW: 13.3 % (ref 11.5–15.5)
WBC: 10 10*3/uL (ref 4.0–10.5)

## 2022-05-06 LAB — LIPID PANEL
Cholesterol: 163 mg/dL (ref 0–200)
HDL: 64.1 mg/dL (ref >39.00)
LDL Cholesterol: 82 mg/dL (ref 0–99)
NonHDL: 99.27
Total CHOL/HDL Ratio: 3
Triglycerides: 84 mg/dL (ref 0.0–149.0)
VLDL: 16.8 mg/dL (ref 0.0–40.0)

## 2022-05-06 LAB — HEMOGLOBIN A1C: Hgb A1c MFr Bld: 6.1 % (ref 4.6–6.5)

## 2022-05-06 NOTE — Telephone Encounter (Signed)
Called pt back today and lvm to return my call if for any reason needing our office to complete FMLA paperwork. Received duplicate copies today via fax. Per pt phone conversation on Friday pt advised she would be picking up paperwork from surgeon's office and already paid that office to have paperwork completed. Advised pt to return call if needed, I will hold paperwork for 30 days

## 2022-05-07 ENCOUNTER — Ambulatory Visit (INDEPENDENT_AMBULATORY_CARE_PROVIDER_SITE_OTHER): Payer: Medicare Other

## 2022-05-07 ENCOUNTER — Ambulatory Visit (INDEPENDENT_AMBULATORY_CARE_PROVIDER_SITE_OTHER): Payer: Medicare Other | Admitting: Surgical

## 2022-05-07 DIAGNOSIS — M1712 Unilateral primary osteoarthritis, left knee: Secondary | ICD-10-CM | POA: Diagnosis not present

## 2022-05-07 DIAGNOSIS — M170 Bilateral primary osteoarthritis of knee: Secondary | ICD-10-CM

## 2022-05-13 ENCOUNTER — Encounter: Payer: Self-pay | Admitting: Physical Medicine and Rehabilitation

## 2022-05-13 ENCOUNTER — Encounter
Payer: Medicare Other | Attending: Physical Medicine and Rehabilitation | Admitting: Physical Medicine and Rehabilitation

## 2022-05-13 VITALS — BP 114/77 | HR 95 | Ht 60.0 in | Wt 210.4 lb

## 2022-05-13 DIAGNOSIS — M79671 Pain in right foot: Secondary | ICD-10-CM | POA: Insufficient documentation

## 2022-05-13 DIAGNOSIS — M79672 Pain in left foot: Secondary | ICD-10-CM | POA: Insufficient documentation

## 2022-05-13 DIAGNOSIS — F17200 Nicotine dependence, unspecified, uncomplicated: Secondary | ICD-10-CM | POA: Diagnosis present

## 2022-05-13 DIAGNOSIS — I639 Cerebral infarction, unspecified: Secondary | ICD-10-CM | POA: Insufficient documentation

## 2022-05-13 MED ORDER — NALTREXONE HCL 50 MG PO TABS: 25.0000 mg | ORAL_TABLET | Freq: Every day | ORAL | 3 refills | Status: DC

## 2022-05-13 MED ORDER — NICOTINE 14 MG/24HR TD PT24: 14.0000 mg | MEDICATED_PATCH | Freq: Every day | TRANSDERMAL | 0 refills | Status: DC

## 2022-05-13 NOTE — Progress Notes (Signed)
Subjective:    Patient ID: Veronica Weber, female    DOB: 02-Jan-1956, 66 y.o.   MRN: [id number]  HPI  [name] is a 66 year old woman who presents for follow-up of stroke.   1) CVA -she has recovered well and is walking well.  2) Active smoker -she asks for a refill of her nicotine patch -she is currently using 14mg  patch- she does not want this dose to be changed -she continues to smoke -she would be interested in a medication that would help her to stop smoking  3) Bone spurs on bilateral heels.  She has bone spurs and had cortisone shots- the left ne is the worst.  She sometimes has to sit down for work.  She is taking 12 week leave for her surgery  Pain Inventory Average Pain 0 Pain Right Now 0 My pain is intermittent and knee pain-- having surgery for TKR next week  LOCATION OF PAIN  knees  BOWEL Number of stools per week: 4-5  BLADDER Pads Bladder incontinence Yes   Mobility walk without assistance how many minutes can you walk? 30 ability to climb steps?  yes do you drive?  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  yes going to have TKR next week left side first  Physicians involved in your care Any changes since last visit?  yes Orthopedist     Family History  Problem Relation Age of Onset   Arthritis Mother    Cancer Maternal Grandmother    Diabetes Maternal Grandmother    Diabetes Maternal Grandfather    Healthy Daughter    Healthy Son    Social History   Socioeconomic History   Marital status: Divorced    Spouse name: Not on file   Number of children: 2   Years of education: Not on file   Highest education level: Some college, no degree  Occupational History   Not on file  Tobacco Use   Smoking status: Every Day    Packs/day: 0.50    Years: 45.00    Total pack years: 22.50    Types:  Cigarettes    Start date: 06/01/1976   Smokeless tobacco: Never  Vaping Use   Vaping Use: Never used  Substance and Sexual Activity   Alcohol use: Not Currently   Drug use: Never   Sexual activity: Not Currently  Other Topics Concern   Not on file  Social History Narrative   Lives alone   Caffeine- coffee 1 -2 c daily   Social Determinants of Health   Financial Resource Strain: Low Risk  (02/19/2022)   Overall Financial Resource Strain (CARDIA)    Difficulty of Paying Living Expenses: Not very hard  Food Insecurity: No Food Insecurity (02/19/2022)   Hunger Vital Sign    Worried About Running Out of Food in the Last Year: Never true    Ran Out of Food in the Last Year: Never true  Transportation Needs: Not on file  Physical Activity: Not on file  Stress: Not on file  Social Connections: Not on file   Past Surgical History:  Procedure Laterality Date   CATARACT EXTRACTION     1997, 1998   CESAREAN SECTION  1983   CHOLECYSTECTOMY  1998   TUBAL LIGATION  1994   Past Medical History:  Diagnosis Date   Arthritis    Cataract  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   Hyperlipidemia    Hypertension    Thyroid disease    Tobacco use    BP 114/77   Pulse 95   Ht 5' (1.524 m)   Wt 210 lb 6.4 oz (95.4 kg)   SpO2 98%   BMI 41.09 kg/m   Opioid Risk Score:   Fall Risk Score:  `1  Depression screen [facility] 2/9     05/13/2022   11:35 AM 12/16/2021   11:06 AM 12/05/2021    1:19 PM 05/27/2021   10:56 AM  Depression screen PHQ 2/9  Decreased Interest 0 0 0 0  Down, Depressed, Hopeless 0 0 0 0  PHQ - 2 Score 0 0 0 0  Altered sleeping 0     Tired, decreased energy 1     Change in appetite 2     Feeling bad or failure about yourself  0     Trouble concentrating 0     Moving slowly or fidgety/restless 0     Suicidal thoughts 0     PHQ-9 Score 3     Difficult doing work/chores Not difficult at all        Review of Systems  Constitutional: Negative.   HENT: Negative.     Eyes: Negative.   Respiratory: Negative.    Cardiovascular: Negative.   Gastrointestinal: Negative.   Endocrine: Negative.   Genitourinary:        Incontinence  Musculoskeletal:  Positive for arthralgias.       Knee pain ankle and toes  Allergic/Immunologic: Negative.   Neurological: Negative.   Hematological: Negative.   Psychiatric/Behavioral: Negative.    All other systems reviewed and are negative.      Objective:   Physical Exam  Gen: no distress, normal appearing HEENT: oral mucosa pink and moist, NCAT Cardio: Reg rate Chest: normal effort, normal rate of breathing Abd: soft, non-distended Ext: no edema Psych: pleasant, normal affect Skin: intact Neuro: Alert and oriented x3 Musculoskeletal: TTP bilateral heels and left knee      Assessment & Plan:   1) CVA -encouraged continued regular ambulation -encouraged smoking cessation  2) Active smoker -refilled nicotine patch -offers higher dose of patch but she defers at this time -prescribed naltrexone to help with smoking cessation, discussed risks and benefits of this treatment.   3) Bilateral bone spurs -discussed extracorporeal shockwave therapy as a treatment option -provided letter for work listing the accommodations that would be helpful for her to continue working.

## 2022-05-13 NOTE — Patient Instructions (Signed)
Extracorporeal shockwave therapy 

## 2022-05-14 NOTE — Pre-Procedure Instructions (Signed)
Surgical Instructions    Your procedure is scheduled on Thursday, September 21st.  Report to [facility] Main Entrance "A" at 05:30 A.M., then check in with the Admitting office.  Call this number if you have problems the morning of surgery:  [phone]   If you have any questions prior to your surgery date call [phone]: Open Monday-Friday 8am-4pm    Remember:  Do not eat after midnight the night before your surgery  You may drink clear liquids until 04:30 AM the morning of your surgery.   Clear liquids allowed are: Water, Non-Citrus Juices (without pulp), Carbonated Beverages, Clear Tea, Black Coffee Only (NO MILK, CREAM OR POWDERED CREAMER of any kind), and Gatorade.  Patient Instructions  The night before surgery:  No food after midnight. ONLY clear liquids after midnight  The day of surgery (if you do NOT have diabetes):  Drink ONE (1) Pre-Surgery Clear Ensure by 04:30 AM the morning of surgery. Drink in one sitting. Do not sip.  This drink was given to you during your hospital  pre-op appointment visit.  Nothing else to drink after completing the  Pre-Surgery Clear Ensure.          If you have questions, please contact your surgeon's office.     Take these medicines the morning of surgery with A SIP OF WATER  atorvastatin (LIPITOR)   Follow provider's instructions regarding naltrexone (DEPADE). Our recommendations is to hold 3 days prior to surgery, but follow provider's instructions.  Follow your surgeon's instructions on when to stop Aspirin.  If no instructions were given by your surgeon then you will need to call the office to get those instructions.     As of today, STOP taking any Aleve, Naproxen, Ibuprofen, Motrin, Advil, Goody's, BC's, all herbal medications, fish oil, and all vitamins. This includes meloxicam (MOBIC).                     Do NOT Smoke (Tobacco/Vaping) for 24 hours prior to your procedure.  If you use a CPAP at night, you may bring your  mask/headgear for your overnight stay.   Contacts, glasses, piercing's, hearing aid's, dentures or partials may not be worn into surgery, please bring cases for these belongings.    For patients admitted to the hospital, discharge time will be determined by your treatment team.   Patients discharged the day of surgery will not be allowed to drive home, and someone needs to stay with them for 24 hours.  SURGICAL WAITING ROOM VISITATION Patients having surgery or a procedure may have no more than 2 support people in the waiting area - these visitors may rotate.   Children under the age of 54 must have an adult with them who is not the patient. If the patient needs to stay at the hospital during part of their recovery, the visitor guidelines for inpatient rooms apply. Pre-op nurse will coordinate an appropriate time for 1 support person to accompany patient in pre-op.  This support person may not rotate.   Please refer to the [facility] website for the visitor guidelines for Inpatients (after your surgery is over and you are in a regular room).    Special instructions:   [place]- Preparing For Surgery  Before surgery, you can play an important role. Because skin is not sterile, your skin needs to be as free of germs as possible. You can reduce the number of germs on your skin by washing with CHG (chlorahexidine gluconate) Soap  before surgery.  CHG is an antiseptic cleaner which kills germs and bonds with the skin to continue killing germs even after washing.    Oral Hygiene is also important to reduce your risk of infection.  Remember - BRUSH YOUR TEETH THE MORNING OF SURGERY WITH YOUR REGULAR TOOTHPASTE  Please do not use if you have an allergy to CHG or antibacterial soaps. If your skin becomes reddened/irritated stop using the CHG.  Do not shave (including legs and underarms) for at least 48 hours prior to first CHG shower. It is OK to shave your face.  Please follow these instructions  carefully.   Shower the NIGHT BEFORE SURGERY and the MORNING OF SURGERY  If you chose to wash your hair, wash your hair first as usual with your normal shampoo.  After you shampoo, rinse your hair and body thoroughly to remove the shampoo.  Use CHG Soap as you would any other liquid soap. You can apply CHG directly to the skin and wash gently with a scrungie or a clean washcloth.   Apply the CHG Soap to your body ONLY FROM THE NECK DOWN.  Do not use on open wounds or open sores. Avoid contact with your eyes, ears, mouth and genitals (private parts). Wash Face and genitals (private parts)  with your normal soap.   Wash thoroughly, paying special attention to the area where your surgery will be performed.  Thoroughly rinse your body with warm water from the neck down.  DO NOT shower/wash with your normal soap after using and rinsing off the CHG Soap.  Pat yourself dry with a CLEAN TOWEL.  Wear CLEAN PAJAMAS to bed the night before surgery  Place CLEAN SHEETS on your bed the night before your surgery  DO NOT SLEEP WITH PETS.   Day of Surgery: Take a shower with CHG soap. Do not wear jewelry or makeup Do not wear lotions, powders, perfumes, or deodorant. Do not shave 48 hours prior to surgery.  Do not bring valuables to the hospital. [facility] is not responsible for any belongings or valuables. Do not wear nail polish, gel polish, artificial nails, or any other type of covering on natural nails (fingers and toes) If you have artificial nails or gel coating that need to be removed by a nail salon, please have this removed prior to surgery. Artificial nails or gel coating may interfere with anesthesia's ability to adequately monitor your vital signs. Wear Clean/Comfortable clothing the morning of surgery Remember to brush your teeth WITH YOUR REGULAR TOOTHPASTE.   Please read over the following fact sheets that you were given.    If you received a COVID test during your pre-op  visit  it is requested that you wear a mask when out in public, stay away from anyone that may not be feeling well and notify your surgeon if you develop symptoms. If you have been in contact with anyone that has tested positive in the last 10 days please notify you surgeon.

## 2022-05-15 ENCOUNTER — Encounter (HOSPITAL_COMMUNITY): Payer: Self-pay

## 2022-05-15 ENCOUNTER — Other Ambulatory Visit: Payer: Self-pay

## 2022-05-15 ENCOUNTER — Encounter (HOSPITAL_COMMUNITY)
Admission: RE | Admit: 2022-05-15 | Discharge: 2022-05-15 | Disposition: A | Discharge status: Home / Self Care | Payer: Medicare Other | Source: Ambulatory Visit | Attending: Orthopedic Surgery | Admitting: Orthopedic Surgery

## 2022-05-15 VITALS — BP 137/71 | Temp 98.1°F | Resp 18 | Ht 63.0 in | Wt 211.2 lb

## 2022-05-15 DIAGNOSIS — I700 Atherosclerosis of aorta: Secondary | ICD-10-CM | POA: Insufficient documentation

## 2022-05-15 DIAGNOSIS — M1712 Unilateral primary osteoarthritis, left knee: Secondary | ICD-10-CM | POA: Insufficient documentation

## 2022-05-15 DIAGNOSIS — I6523 Occlusion and stenosis of bilateral carotid arteries: Secondary | ICD-10-CM | POA: Insufficient documentation

## 2022-05-15 DIAGNOSIS — Z79899 Other long term (current) drug therapy: Secondary | ICD-10-CM | POA: Insufficient documentation

## 2022-05-15 DIAGNOSIS — J432 Centrilobular emphysema: Secondary | ICD-10-CM | POA: Insufficient documentation

## 2022-05-15 DIAGNOSIS — R9082 White matter disease, unspecified: Secondary | ICD-10-CM | POA: Diagnosis not present

## 2022-05-15 DIAGNOSIS — Z8673 Personal history of transient ischemic attack (TIA), and cerebral infarction without residual deficits: Secondary | ICD-10-CM | POA: Insufficient documentation

## 2022-05-15 DIAGNOSIS — Z01818 Encounter for other preprocedural examination: Secondary | ICD-10-CM | POA: Insufficient documentation

## 2022-05-15 DIAGNOSIS — E079 Disorder of thyroid, unspecified: Secondary | ICD-10-CM | POA: Insufficient documentation

## 2022-05-15 DIAGNOSIS — E785 Hyperlipidemia, unspecified: Secondary | ICD-10-CM | POA: Diagnosis not present

## 2022-05-15 DIAGNOSIS — I10 Essential (primary) hypertension: Secondary | ICD-10-CM | POA: Diagnosis not present

## 2022-05-15 HISTORY — DX: Complete or unspecified spontaneous abortion without complication: O03.9

## 2022-05-15 HISTORY — DX: Reserved for inherently not codable concepts without codable children: IMO0001

## 2022-05-15 HISTORY — DX: Emphysema, unspecified: J43.9

## 2022-05-15 LAB — SURGICAL PCR SCREEN
MRSA, PCR: NEGATIVE
Staphylococcus aureus: NEGATIVE

## 2022-05-15 NOTE — Progress Notes (Signed)
PCP - Alyssa Allwardt, PA-C Cardiologist - denies (Pt saw Dr. Wyline Mood in Feb 2023 but not routinely)  PPM/ICD - denies   Chest x-ray - denies EKG - 12/16/21 Stress Test - denies ECHO - 11/17/21 Cardiac Cath - denies  Sleep Study - denies   DM- denies  Blood Thinner Instructions: n/a Aspirin Instructions: hold 5 days  ERAS Protcol - yes PRE-SURGERY Ensure given at PAT  COVID TEST- n/a   Anesthesia review: yes, hx of stroke  Patient denies shortness of breath, fever, cough and chest pain at PAT appointment   All instructions explained to the patient, with a verbal understanding of the material. Patient agrees to go over the instructions while at home for a better understanding.  The opportunity to ask questions was provided.

## 2022-05-16 ENCOUNTER — Encounter: Payer: Self-pay | Admitting: Orthopedic Surgery

## 2022-05-16 LAB — URINE CULTURE

## 2022-05-16 NOTE — Progress Notes (Signed)
Office Visit Note   Patient: Veronica Weber           Date of Birth: 1956/06/04           MRN: [id number] Visit Date: 05/07/2022 Requested by: Allwardt, Crist Infante, PA-C [address] [place],  [place] [address] PCP: Bary Leriche, PA-C  Subjective: Chief Complaint  Patient presents with   Other    Xray needed     HPI: Veronica Weber is a 66 y.o. female who presents to the office complaining of left knee pain.  She is scheduled for left total knee arthroplasty on 9/21.  Coming in today for x-rays and she has several questions that she would like answered prior to surgery.  Looking forward to surgery.  She has discontinued her Plavix and is just taking aspirin at this point.  Neurology is following her following her stroke.  After surgery, her daughter will be staying with her until October 14 to help her out.  She has 5 stairs to get into her house..                ROS: All systems reviewed are negative as they relate to the chief complaint within the history of present illness.  Patient denies fevers or chills.  Assessment & Plan: Visit Diagnoses:  1. Bilateral primary osteoarthritis of knee     Plan: Patient is a 66 year old female who presents for repeat evaluation prior to left total knee arthroplasty as a scheduled for 05/22/2022.  She is looking forward to surgery.  X-rays were done today for preop evaluation as its been a while since she has had radiographs.  Plan to obtain formal risk stratification from neurology prior to procedure but patient has made them aware in the past that she is planning on pursuing knee replacement around this time.  She is off Plavix.  Plan is for her to follow-up with the office 2 weeks postop.  Follow-Up Instructions: No follow-ups on file.   Orders:  Orders Placed This Encounter  Procedures   XR KNEE 3 VIEW LEFT   No orders of the defined types were placed in this encounter.     Procedures: No procedures  performed   Clinical Data: No additional findings.  Objective: Vital Signs: There were no vitals taken for this visit.  Physical Exam:  Constitutional: Patient appears well-developed HEENT:  Head: Normocephalic Eyes:EOM are normal Neck: Normal range of motion Cardiovascular: Normal rate Pulmonary/chest: Effort normal Neurologic: Patient is alert Skin: Skin is warm Psychiatric: Patient has normal mood and affect  Ortho Exam: Ortho exam demonstrates left knee with no effusion.  5 degrees extension and 115 degrees of knee flexion.  She has moderate tenderness over the medial and lateral joint lines.  Antalgic gait.  Able to perform straight leg raise without extensor lag.  No calf tenderness.  Negative Homans' sign.  Palpable pedal pulses of the left lower extremity.  Specialty Comments:  No specialty comments available.  Imaging: No results found.   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   Essential hypertension 11/16/2021   Hyperlipidemia 11/16/2021   Tobacco use 11/16/2021   Syncope and collapse 06/26/2021   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   Hyperlipidemia    Hypertension    Thyroid disease    Tobacco use     Family History  Problem Relation Age of Onset   Arthritis Mother    Cancer Maternal Grandmother    Diabetes Maternal Grandmother    Diabetes Maternal Grandfather    Healthy Daughter    Healthy Son     Past Surgical History:  Procedure Laterality Date   APPENDECTOMY  1976   CATARACT EXTRACTION     1997, 1998   CESAREAN SECTION   1983   CHOLECYSTECTOMY  1998   TUBAL LIGATION  1994   Social History   Occupational History   Not on file  Tobacco Use   Smoking status: Every Day    Packs/day: 0.25    Years: 45.00    Total pack years: 11.25    Types: Cigarettes    Start date: 06/01/1976   Smokeless tobacco: Never  Vaping Use   Vaping Use: Never used  Substance and Sexual Activity   Alcohol use: Not Currently   Drug use: Never   Sexual activity: Not Currently

## 2022-05-16 NOTE — Progress Notes (Signed)
Anesthesia Chart Review:  Case: [id number] Date/Time: 05/22/22 0715   Procedure: LEFT TOTAL KNEE ARTHROPLASTY (Left: Knee)   Anesthesia type: Spinal   Pre-op diagnosis: left knee osteoarthritis   [facility] / [facility]   Surgeons: [name], MD       DISCUSSION: Patient is a 66 year old female scheduled for the above procedure.  History includes smoking, HTN, COPD/emphysema, HLD, CVA (11/16/21), thyroid disease (not specified, normal TSH 10/15/20). BMI is consistent with obesity.   Last neurology follow-up was on 12/31/21 with [name], MD. neurologically patient was back to baseline following her 11/16/2021 CVA which was felt likely secondary to right M1 stenosis.  Aspirin and Plavix for total 3 months recommended then plan for aspirin monotherapy.  Follow-up 6 months planned.  She was evaluated by cardiologist Dr. Carolan Clines initially on 07/09/21 for evaluation for recurrent syncope.  She described a prodrome is consistent with vasovagal syncope.  November 2022 event monitor was unremarkable.  March 2023 echo was unremarkable.  She also had diffuse coronary (LM, LAD, RCA) and aortic calcifications on CT imaging.  She was treated with statin therapy.  Ongoing follow-up with primary care recommended with as needed cardiology follow-up if she developed new symptoms at her last visit on 10/15/21.  PCP follow-up on 03/05/22 with Allwardt, Alyssa M, PA-C. She notes patient had knee surgery on 05/22/22.    Patient reported instructions to hold aspirin for 5 days prior to surgery.  She is on naltrexone per Dr. Marthann Schiller on 05/13/22 to help with smoking cessation. Advised to discuss perioperative instructions with prescriber if okay to hold nalrexone for 3 days prior to surgery.    VS: BP 137/71   Temp 36.7 C (Oral)   Resp 18   Ht 5\' 3"  (1.6 m)   Wt 95.8 kg   SpO2 97%   BMI 37.41 kg/m    PROVIDERS: Allwardt, , PA-C is PCP  Crist Infante, MD is  cardiologist Carolan Clines, MD is PM&R.  Last visit 05/13/2022. 07/13/2022, MD is neurologist   LABS: She had labs on 05/06/22. Most recent results in [facility] include: Lab Results  Component Value Date   WBC 10.0 05/06/2022   HGB 13.7 05/06/2022   HCT 40.1 05/06/2022   PLT 316.0 05/06/2022   GLUCOSE 83 05/06/2022   ALT 20 05/06/2022   AST 16 05/06/2022   NA 141 05/06/2022   K 3.8 05/06/2022   CL 104 05/06/2022   CREATININE 0.87 05/06/2022   BUN 21 05/06/2022   CO2 31 05/06/2022   TSH 3.21 10/15/2020   INR 0.9 11/16/2021   HGBA1C 6.1 05/06/2022   Labs on 05/15/22: Labs Reviewed  URINE CULTURE - Abnormal; Notable for the following components:      Result Value   Culture MULTIPLE SPECIES PRESENT, SUGGEST RECOLLECTION (*)    All other components within normal limits  SURGICAL PCR SCREEN  - Defer to ortho recommendations on urine culture.    IMAGES: CT Head & CTA Head/Neck 11/17/21: IMPRESSION: 1. No large vessel occlusion. 2. 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 3. Mild-to-moderate right and mild left paraclinoid ICA stenosis. 4. No significant (greater than 50%) stenosis in the neck.   MRI Brain 11/17/21: IMPRESSION: 1. Patchy and scattered small acute infarcts in the Right MCA territory, primarily the posterior right MCA division with some MCA/PCA watershed involved. No associated hemorrhage or mass effect. 2. Chronic contralateral posterior left MCA territory infarct, and severe bilateral cerebral  white matter disease.  MRI L-spine 11/06/21: IMPRESSION: 1. Transitional anatomy with lumbarization of the L5 vertebral body. Careful attention to this numbering scheme is recommended if any intervention is planned. 2. Mild anterior wedge deformity of the L1 vertebral body with faint marrow edema, also present on the CT chest from 10/02/2021 and likely subacute in chronicity. There is no bony retropulsion or cord compression. 3. Mild anterior wedge  deformity of the T11 vertebral body, chronic in appearance. 4. Overall mild multilevel degenerative changes throughout the lumbar spine as detailed above without high-grade spinal canal or neural foraminal stenosis.   CT Chest LCS 10/02/21: IMPRESSION: 1. Lung-RADS 3S, probably benign findings. Short-term follow-up in 6 months is recommended with repeat low-dose chest CT without contrast (please use the following order, "CT CHEST LCS NODULE FOLLOW-UP W/O CM"). 2. The "S" modifier above refers to potentially clinically significant non lung cancer related findings. Specifically, there is aortic atherosclerosis, in addition to left main and 2 vessel coronary artery disease. Please note that although the presence of coronary artery calcium documents the presence of coronary artery disease, the severity of this disease and any potential stenosis cannot be assessed on this non-gated CT examination. Assessment for potential risk factor modification, dietary therapy or pharmacologic therapy may be warranted, if clinically indicated. 3. Mild diffuse bronchial wall thickening with very mild centrilobular and paraseptal emphysema; imaging findings suggestive of underlying COPD. - Aortic Atherosclerosis (ICD10-I70.0) and Emphysema (ICD10-J43.9). -  There is aortic atherosclerosis, as well as atherosclerosis of the great vessels of the mediastinum and the coronary arteries, including calcified atherosclerotic plaque in the left main, left anterior descending and right coronary arteries.   EKG: 12/16/21: Sinus  Rhythm  Low voltage in limb leads.    CV: Echo 11/17/21: IMPRESSIONS   1. Left ventricular ejection fraction, by estimation, is 60 to 65%. The  left ventricle has normal function. The left ventricle has no regional  wall motion abnormalities. Left ventricular diastolic parameters are  consistent with Grade I diastolic  dysfunction (impaired relaxation).   2. Right ventricular  systolic function is normal. The right ventricular  size is normal.   3. The mitral valve is normal in structure. No evidence of mitral valve  regurgitation. No evidence of mitral stenosis.   4. The aortic valve is normal in structure. Aortic valve regurgitation is  not visualized. No aortic stenosis is present.   5. The inferior vena cava is normal in size with greater than 50%  respiratory variability, suggesting right atrial pressure of 3 mmHg.   Cardiac event monitor 07/18/21-08/16/21: No significant abnormalities   Had 4x triggered and 1 auto triggered event for sinus rhythm, sinus tachycardia. No significant tachyarrhythmia or bradyarrhythmia. No atrial fibrillation or flutter.     Past Medical History:  Diagnosis Date   Abortion    x3   Arthritis    Cataract    Emphysema/COPD ([place])    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   Hyperlipidemia    Hypertension    Thyroid disease    Tobacco use     Past Surgical History:  Procedure Laterality Date   [place]    MEDICATIONS:  aspirin EC 81 MG EC tablet   atorvastatin (LIPITOR) 80 MG tablet   chlorthalidone (HYGROTON) 25 MG tablet   gabapentin (NEURONTIN) 300 MG capsule  lidocaine (LIDODERM) 5 %   losartan (COZAAR) 50 MG tablet   magnesium gluconate (MAGONATE) 500 MG tablet   meloxicam (MOBIC) 15 MG tablet   Multiple Vitamins-Minerals (MULTIVITAMIN WITH MINERALS) tablet   naltrexone (DEPADE) 50 MG tablet   nicotine (NICODERM CQ - DOSED IN MG/24 HOURS) 14 mg/24hr patch   No current facility-administered medications for this encounter.    Shonna Chock, PA-C Surgical Short Stay/Anesthesiology [facility] Phone [phone] [facility] Phone [phone] 05/16/2022 6:18 PM

## 2022-05-16 NOTE — Progress Notes (Signed)
UC results reported to [facility], PA-C

## 2022-05-17 ENCOUNTER — Other Ambulatory Visit: Payer: Self-pay | Admitting: Physical Medicine and Rehabilitation

## 2022-05-19 ENCOUNTER — Telehealth: Payer: Self-pay

## 2022-05-19 ENCOUNTER — Telehealth: Payer: Self-pay | Admitting: *Deleted

## 2022-05-19 NOTE — Telephone Encounter (Signed)
Received fax from [place] at [facility] for Surgical Clearance. Placed on MD desk for sign and review.

## 2022-05-19 NOTE — Telephone Encounter (Signed)
Pt agreeable to tele pre op appt 05/20/22 @ 10:40, urgent add on due to surgery date 05/22/22. Pt states PCP already has her holding her ASA x 2 days ago. Med rec and consent are done.    Pt is grateful for our help.     Patient Consent for Virtual Visit        Veronica Weber has provided verbal consent on 05/19/2022 for a virtual visit (video or telephone).   CONSENT FOR VIRTUAL VISIT FOR:  Veronica Weber  By participating in this virtual visit I agree to the following:  I hereby voluntarily request, consent and authorize [place] and its employed or contracted physicians, physician assistants, nurse practitioners or other licensed health care professionals (the Practitioner), to provide me with telemedicine health care services (the "Services") as deemed necessary by the treating Practitioner. I acknowledge and consent to receive the Services by the Practitioner via telemedicine. I understand that the telemedicine visit will involve communicating with the Practitioner through live audiovisual communication technology and the disclosure of certain medical information by electronic transmission. I acknowledge that I have been given the opportunity to request an in-person assessment or other available alternative prior to the telemedicine visit and am voluntarily participating in the telemedicine visit.  I understand that I have the right to withhold or withdraw my consent to the use of telemedicine in the course of my care at any time, without affecting my right to future care or treatment, and that the Practitioner or I may terminate the telemedicine visit at any time. I understand that I have the right to inspect all information obtained and/or recorded in the course of the telemedicine visit and may receive copies of available information for a reasonable fee.  I understand that some of the potential risks of receiving the Services via telemedicine include:  Delay  or interruption in medical evaluation due to technological equipment failure or disruption; Information transmitted may not be sufficient (e.g. poor resolution of images) to allow for appropriate medical decision making by the Practitioner; and/or  In rare instances, security protocols could fail, causing a breach of personal health information.  Furthermore, I acknowledge that it is my responsibility to provide information about my medical history, conditions and care that is complete and accurate to the best of my ability. I acknowledge that Practitioner's advice, recommendations, and/or decision may be based on factors not within their control, such as incomplete or inaccurate data provided by me or distortions of diagnostic images or specimens that may result from electronic transmissions. I understand that the practice of medicine is not an exact science and that Practitioner makes no warranties or guarantees regarding treatment outcomes. I acknowledge that a copy of this consent can be made available to me via my patient portal ([facility]), or I can request a printed copy by calling the office of [facility].    I understand that my insurance will be billed for this visit.   I have read or had this consent read to me. I understand the contents of this consent, which adequately explains the benefits and risks of the Services being provided via telemedicine.  I have been provided ample opportunity to ask questions regarding this consent and the Services and have had my questions answered to my satisfaction. I give my informed consent for the services to be provided through the use of telemedicine in my medical care

## 2022-05-19 NOTE — Telephone Encounter (Signed)
   Pre-operative Risk Assessment    Patient Name: Veronica Weber  DOB: 1955-09-05 MRN: [id number]      Request for Surgical Clearance    Procedure:   LEFT TOTAL KNEE ARTHROSCOPY   Date of Surgery:  Clearance 05/22/22                                 Surgeon:  DR. Anderson Malta Surgeon's Group or Practice Name:  [facility] Phone number:  [phone] Fax number:  [phone]   Type of Clearance Requested:   - Medical ; ASA    Type of Anesthesia:  Spinal   Additional requests/questions:    Jiles Prows   05/19/2022, 4:52 PM

## 2022-05-19 NOTE — Telephone Encounter (Signed)
   Name: Veronica Weber  DOB: 1956-08-08  MRN: [id number]  Primary Cardiologist: Janina Mayo, MD  Chart reviewed as part of pre-operative protocol coverage. Because of Veronica Weber past medical history and time since last visit, she will require a follow-up telephone visit in order to better assess preoperative cardiovascular risk.  Pre-op covering staff: - Please schedule appointment and call patient to inform them. If patient already had an upcoming appointment within acceptable timeframe, please add "pre-op clearance" to the appointment notes so provider is aware. - Please contact requesting surgeon's office via preferred method (i.e, phone, fax) to inform them of need for appointment prior to surgery.  Aspirin is prescribed by her primary.  Please reach out to them for clearance.   Elgie Collard, PA-C  05/19/2022, 5:06 PM

## 2022-05-19 NOTE — Telephone Encounter (Signed)
Pt agreeable to tele pre op appt 05/20/22 @ 10:40, urgent add on due to surgery date 05/22/22. Pt states PCP already has her holding her ASA x 2 days ago. Med rec and consent are done.    Pt is grateful for our help.

## 2022-05-20 ENCOUNTER — Ambulatory Visit: Payer: Medicare Other | Attending: Interventional Cardiology | Admitting: Physician Assistant

## 2022-05-20 DIAGNOSIS — Z01810 Encounter for preprocedural cardiovascular examination: Secondary | ICD-10-CM

## 2022-05-20 NOTE — Telephone Encounter (Signed)
Pt following up on surgical clearance for surgery having on Thursday, 05/22/22. Would like a call from the nurse to confirm clearance has been faxed.

## 2022-05-20 NOTE — Anesthesia Preprocedure Evaluation (Addendum)
Anesthesia Evaluation  Patient identified by MRN, date of birth, ID band Patient awake    Reviewed: Allergy & Precautions, NPO status , Patient's Chart, lab work & pertinent test results, reviewed documented beta blocker date and time   Airway Mallampati: II  TM Distance: >3 FB Neck ROM: Full    Dental  (+) Edentulous Upper, Edentulous Lower, Dental Advisory Given   Pulmonary COPD,  COPD inhaler, Current Smoker   Pulmonary exam normal breath sounds clear to auscultation       Cardiovascular hypertension, Pt. on medications Normal cardiovascular exam Rhythm:Regular Rate:Normal  Echo 11/17/21 1. Left ventricular ejection fraction, by estimation, is 60 to 65%. The left ventricle has normal function. The left ventricle has no regional wall motion abnormalities. Left ventricular diastolic parameters are consistent with Grade I diastolic dysfunction (impaired relaxation).  2. Right ventricular systolic function is normal. The right ventricular size is normal.  3. The mitral valve is normal in structure. No evidence of mitral valve regurgitation. No evidence of mitral stenosis.  4. The aortic valve is normal in structure. Aortic valve regurgitation is not visualized. No aortic stenosis is present.  5. The inferior vena cava is normal in size with greater than 50% respiratory variability, suggesting right atrial pressure of 3 mmHg.    Neuro/Psych Right MCA stroke 10/2021 CVA, Residual Symptoms  negative psych ROS   GI/Hepatic negative GI ROS, Neg liver ROS,,,  Endo/Other  Hyperlipidemia Obesity  Renal/GU negative Renal ROS  negative genitourinary   Musculoskeletal  (+) Arthritis , Osteoarthritis,  OA left knee   Abdominal  (+) + obese  Peds  Hematology negative hematology ROS (+)   Anesthesia Other Findings   Reproductive/Obstetrics                            Anesthesia Physical Anesthesia  Plan  ASA: 3  Anesthesia Plan: Spinal   Post-op Pain Management: Regional block* and Minimal or no pain anticipated   Induction: Intravenous  PONV Risk Score and Plan: 2 and Treatment may vary due to age or medical condition, Propofol infusion and Ondansetron  Airway Management Planned: Natural Airway and Simple Face Mask  Additional Equipment: None  Intra-op Plan:   Post-operative Plan:   Informed Consent: I have reviewed the patients History and Physical, chart, labs and discussed the procedure including the risks, benefits and alternatives for the proposed anesthesia with the patient or authorized representative who has indicated his/her understanding and acceptance.       Plan Discussed with: CRNA and Anesthesiologist  Anesthesia Plan Comments: (PAT note written 05/20/2022 by Myra Gianotti, PA-C. )        Anesthesia Quick Evaluation

## 2022-05-20 NOTE — Progress Notes (Signed)
Virtual Visit via Telephone Note   Because of Veronica Weber's co-morbid illnesses, she is at least at moderate risk for complications without adequate follow up.  This format is felt to be most appropriate for this patient at this time.  The patient did not have access to video technology/had technical difficulties with video requiring transitioning to audio format only (telephone).  All issues noted in this document were discussed and addressed.  No physical exam could be performed with this format.  Please refer to the patient's chart for her consent to telehealth for [facility].  Evaluation Performed:  Preoperative cardiovascular risk assessment _____________   Date:  05/20/2022   Patient ID:  Veronica, Weber 1956/04/05, MRN [id number] Patient Location:  Home Provider location:   Office  Primary Care Provider:  Allwardt, Randa Evens, PA-C Primary Cardiologist:  Janina Mayo, MD  Chief Complaint / Patient Profile   66 y.o. y/o female with a h/o arthritis, thyroid disease, right MCA stroke, tubal ligation, cholecystectomy, history of syncope who is pending left total knee arthroscopy and presents today for telephonic preoperative cardiovascular risk assessment.  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 female who presents via audio/video conferencing for a telehealth visit today.  Pt was last seen in cardiology clinic on 07/09/21 by Dr. Harl Bowie.  At that time  ZAMYRA ALLENSWORTH was doing well .  The patient is now pending procedure as outlined above. Since her last visit, she has been doing pretty well without any heart issues.  She tells me that she is actually getting a knee replacement.  She is able to do all the inside housework and go up a flight of stairs to her home.  She does not do any outside housework.  For this reason, she scored a 4.73 METS on the DASI.  This exceeds the minimum 4 METS requirement.  She is okay to hold ASA x 5 days prior to surgery.  Please resume medically safe to do so.  Home Medications    Prior to Admission medications   Medication Sig Start Date End Date Taking? Authorizing Provider  aspirin EC 81 MG EC tablet Take 1 tablet (81 mg total) by mouth daily. Swallow whole. 11/22/21   Angiulli, Lavon Paganini, PA-C  atorvastatin (LIPITOR) 80 MG tablet Take 1 tablet (80 mg total) by mouth daily. 03/05/22 06/03/22  Allwardt, Randa Evens, PA-C  chlorthalidone (HYGROTON) 25 MG tablet Take 1 tablet (25 mg total) by mouth daily. Take 1/2 tablet by mouth daily. Patient taking differently: Take 12.5 mg by mouth daily. 03/05/22   Allwardt, Randa Evens, PA-C  gabapentin (NEURONTIN) 300 MG capsule Take 1-2 pills nightly at bedtime Patient taking differently: Take 300 mg by mouth at bedtime. 02/19/22   Allwardt, Alyssa M, PA-C  lidocaine (LIDODERM) 5 % PLACE 1  PATCH ONTO THE SKIN DAILY, REMOVE AFTER 12 HOURS OR AS DIRECTED BY MD Patient taking differently: Place 1 patch onto the skin daily as needed (pain). 01/15/22   [name], MD  losartan (COZAAR) 50 MG tablet Take 1 tablet (50 mg total) by mouth daily. 03/05/22 06/03/22  Allwardt, Crist Infante, PA-C  magnesium gluconate (MAGONATE) 500 MG tablet Take 1 tablet (500 mg total) by mouth every evening. 03/05/22 06/03/22  Allwardt, Crist Infante, PA-C  meloxicam (MOBIC) 15 MG tablet Take 1 tablet (15 mg total) by mouth daily. 04/28/22   Felecia Shelling, DPM  Multiple Vitamins-Minerals (MULTIVITAMIN WITH  MINERALS) tablet Take 1 tablet by mouth daily.    [provider]  naltrexone (DEPADE) 50 MG tablet Take 0.5 tablets (25 mg total) by mouth daily. 05/13/22   Raulkar, Drema Pry, MD  nicotine (NICODERM CQ - DOSED IN MG/24 HOURS) 14 mg/24hr patch Place 1 patch (14 mg total) onto the skin daily. 05/13/22   Raulkar, Drema Pry, MD    Physical Exam    Vital Signs:  PRACHI OFTEDAHL does not have vital signs available for review today.  Given telephonic nature of communication, physical exam is limited. AAOx3. NAD. Normal affect.  Speech and respirations are unlabored.  Accessory Clinical Findings    None  Assessment & Plan    1.  Preoperative Cardiovascular Risk Assessment:  Ms. Trego perioperative risk of a major cardiac event is 0.9% according to the Revised Cardiac Risk Index (RCRI).  Therefore, she is at low risk for perioperative complications.   Her functional capacity is fair at 4.73 METs according to the [facility] Activity Status Index (DASI). Recommendations: According to ACC/AHA guidelines, no further cardiovascular testing needed.  The patient may proceed to surgery at acceptable risk.   Antiplatelet and/or Anticoagulation Recommendations: Aspirin can be held for 5 days prior to her surgery.  Please resume Aspirin post operatively when it is felt to be safe from a bleeding standpoint.   A copy of this note will be routed to requesting surgeon.  Time:   Today, I have spent 6 minutes with the patient with telehealth technology discussing medical history, symptoms, and management plan.     Sharlene Dory, PA-C  05/20/2022, 10:50 AM

## 2022-05-20 NOTE — Telephone Encounter (Signed)
Called pt back and LVM that Surgical Clearance has been faxed back to [facility]

## 2022-05-21 ENCOUNTER — Encounter (HOSPITAL_COMMUNITY): Payer: Self-pay | Admitting: Orthopedic Surgery

## 2022-05-21 ENCOUNTER — Telehealth: Payer: Self-pay | Admitting: Physician Assistant

## 2022-05-21 MED ORDER — TRANEXAMIC ACID 1000 MG/10ML IV SOLN
2000.0000 mg | INTRAVENOUS | Status: DC
  Filled 2022-05-21: qty 20

## 2022-05-21 NOTE — Telephone Encounter (Signed)
..  Type of form received: placard extend due to knee sx on 9/20   Additional comments:   Received by: patient   Form should be Faxed to: na   Form should be mailed to:   na   Is patient requesting call for pickup: yes    Form placed:   [facility] folder   Attach charge sheet.  Yes   Individual made aware of 3-5 business day turn around (Y/N)?   Yes

## 2022-05-21 NOTE — Telephone Encounter (Signed)
Pt states: -wondering if handicap placard was going to be completed by PCP team -it is ok if her daughter picks up the form for her, Veronica Weber.   Pt requests: -please update as appropriate.

## 2022-05-22 ENCOUNTER — Encounter (HOSPITAL_COMMUNITY): Payer: Self-pay | Admitting: Orthopedic Surgery

## 2022-05-22 ENCOUNTER — Other Ambulatory Visit: Payer: Self-pay

## 2022-05-22 ENCOUNTER — Ambulatory Visit (HOSPITAL_COMMUNITY): Payer: Medicare Other | Admitting: Physician Assistant

## 2022-05-22 ENCOUNTER — Encounter (HOSPITAL_COMMUNITY)
Admission: RE | Disposition: A | Discharge status: Home / Self Care | Payer: Self-pay | Source: Home / Self Care | Attending: Orthopedic Surgery

## 2022-05-22 ENCOUNTER — Observation Stay (HOSPITAL_COMMUNITY)
Admission: RE | Admit: 2022-05-22 | Discharge: 2022-05-24 | Disposition: A | Discharge status: Home / Self Care | Payer: Medicare Other | Attending: Orthopedic Surgery | Admitting: Orthopedic Surgery

## 2022-05-22 ENCOUNTER — Ambulatory Visit (HOSPITAL_BASED_OUTPATIENT_CLINIC_OR_DEPARTMENT_OTHER): Payer: Medicare Other | Admitting: Anesthesiology

## 2022-05-22 DIAGNOSIS — Z01818 Encounter for other preprocedural examination: Secondary | ICD-10-CM

## 2022-05-22 DIAGNOSIS — I6990 Unspecified sequelae of unspecified cerebrovascular disease: Secondary | ICD-10-CM | POA: Diagnosis not present

## 2022-05-22 DIAGNOSIS — F17210 Nicotine dependence, cigarettes, uncomplicated: Secondary | ICD-10-CM | POA: Diagnosis not present

## 2022-05-22 DIAGNOSIS — J449 Chronic obstructive pulmonary disease, unspecified: Secondary | ICD-10-CM | POA: Insufficient documentation

## 2022-05-22 DIAGNOSIS — M179 Osteoarthritis of knee, unspecified: Secondary | ICD-10-CM | POA: Diagnosis present

## 2022-05-22 DIAGNOSIS — I10 Essential (primary) hypertension: Secondary | ICD-10-CM | POA: Insufficient documentation

## 2022-05-22 DIAGNOSIS — M1712 Unilateral primary osteoarthritis, left knee: Principal | ICD-10-CM | POA: Insufficient documentation

## 2022-05-22 DIAGNOSIS — M1710 Unilateral primary osteoarthritis, unspecified knee: Secondary | ICD-10-CM | POA: Diagnosis present

## 2022-05-22 HISTORY — PX: TOTAL KNEE ARTHROPLASTY: SHX125

## 2022-05-22 SURGERY — ARTHROPLASTY, KNEE, TOTAL
Anesthesia: Spinal | Site: Knee | Laterality: Left

## 2022-05-22 MED ORDER — METOCLOPRAMIDE HCL 5 MG PO TABS: 5.0000 mg | ORAL_TABLET | Freq: Three times a day (TID) | ORAL | Status: DC | PRN

## 2022-05-22 MED ORDER — POVIDONE-IODINE 7.5 % EX SOLN
Freq: Once | CUTANEOUS | Status: DC
  Filled 2022-05-22: qty 118

## 2022-05-22 MED ORDER — TRANEXAMIC ACID-NACL 1000-0.7 MG/100ML-% IV SOLN
INTRAVENOUS | Status: AC
  Filled 2022-05-22: qty 100

## 2022-05-22 MED ORDER — ADULT MULTIVITAMIN W/MINERALS CH
1.0000 | ORAL_TABLET | Freq: Every day | ORAL | Status: DC
  Administered 2022-05-22 – 2022-05-24 (×3): 1 via ORAL
  Filled 2022-05-22 (×3): qty 1

## 2022-05-22 MED ORDER — PHENYLEPHRINE 80 MCG/ML (10ML) SYRINGE FOR IV PUSH (FOR BLOOD PRESSURE SUPPORT)
PREFILLED_SYRINGE | INTRAVENOUS | Status: AC
  Filled 2022-05-22: qty 10

## 2022-05-22 MED ORDER — FENTANYL CITRATE (PF) 250 MCG/5ML IJ SOLN
INTRAMUSCULAR | Status: DC | PRN
  Administered 2022-05-22: 50 ug via INTRAVENOUS

## 2022-05-22 MED ORDER — OXYCODONE HCL 5 MG/5ML PO SOLN: 5.0000 mg | Freq: Once | ORAL | Status: AC | PRN

## 2022-05-22 MED ORDER — NICOTINE 14 MG/24HR TD PT24
14.0000 mg | MEDICATED_PATCH | Freq: Every day | TRANSDERMAL | Status: DC
  Administered 2022-05-22 – 2022-05-24 (×3): 14 mg via TRANSDERMAL
  Filled 2022-05-22 (×3): qty 1

## 2022-05-22 MED ORDER — DEXAMETHASONE SODIUM PHOSPHATE 10 MG/ML IJ SOLN
INTRAMUSCULAR | Status: AC
  Filled 2022-05-22: qty 1

## 2022-05-22 MED ORDER — MIDAZOLAM HCL 2 MG/2ML IJ SOLN
INTRAMUSCULAR | Status: DC | PRN
  Administered 2022-05-22: 1 mg via INTRAVENOUS

## 2022-05-22 MED ORDER — HYDROMORPHONE HCL 1 MG/ML IJ SOLN
INTRAMUSCULAR | Status: AC
  Filled 2022-05-22: qty 1

## 2022-05-22 MED ORDER — MIDAZOLAM HCL 2 MG/2ML IJ SOLN
INTRAMUSCULAR | Status: AC
  Filled 2022-05-22: qty 2

## 2022-05-22 MED ORDER — HYDROMORPHONE HCL 1 MG/ML IJ SOLN
0.2500 mg | INTRAMUSCULAR | Status: DC | PRN
  Administered 2022-05-22 (×3): 0.5 mg via INTRAVENOUS

## 2022-05-22 MED ORDER — NAPROXEN 250 MG PO TABS
250.0000 mg | ORAL_TABLET | Freq: Two times a day (BID) | ORAL | Status: DC
  Administered 2022-05-22 – 2022-05-24 (×5): 250 mg via ORAL
  Filled 2022-05-22 (×5): qty 1

## 2022-05-22 MED ORDER — VANCOMYCIN HCL 1000 MG IV SOLR
INTRAVENOUS | Status: AC
  Filled 2022-05-22: qty 20

## 2022-05-22 MED ORDER — BUPIVACAINE HCL (PF) 0.5 % IJ SOLN
INTRAMUSCULAR | Status: DC | PRN
  Administered 2022-05-22: 30 mL via PERINEURAL

## 2022-05-22 MED ORDER — LACTATED RINGERS IV SOLN: INTRAVENOUS | Status: DC

## 2022-05-22 MED ORDER — ORAL CARE MOUTH RINSE: 15.0000 mL | Freq: Once | OROMUCOSAL | Status: AC

## 2022-05-22 MED ORDER — PHENOL 1.4 % MT LIQD: 1.0000 | OROMUCOSAL | Status: DC | PRN

## 2022-05-22 MED ORDER — EPHEDRINE 5 MG/ML INJ
INTRAVENOUS | Status: AC
  Filled 2022-05-22: qty 5

## 2022-05-22 MED ORDER — VANCOMYCIN HCL 1000 MG IV SOLR
INTRAVENOUS | Status: DC | PRN
  Administered 2022-05-22: 1000 mg

## 2022-05-22 MED ORDER — EPHEDRINE SULFATE-NACL 50-0.9 MG/10ML-% IV SOSY
PREFILLED_SYRINGE | INTRAVENOUS | Status: DC | PRN
  Administered 2022-05-22 (×3): 5 mg via INTRAVENOUS

## 2022-05-22 MED ORDER — OXYCODONE HCL 5 MG PO TABS
5.0000 mg | ORAL_TABLET | Freq: Once | ORAL | Status: AC | PRN
  Administered 2022-05-22: 5 mg via ORAL

## 2022-05-22 MED ORDER — PROPOFOL 10 MG/ML IV BOLUS
INTRAVENOUS | Status: DC | PRN
  Administered 2022-05-22: 30 mg via INTRAVENOUS

## 2022-05-22 MED ORDER — PROPOFOL 500 MG/50ML IV EMUL
INTRAVENOUS | Status: DC | PRN
  Administered 2022-05-22 (×2): 75 ug/kg/min via INTRAVENOUS

## 2022-05-22 MED ORDER — ACETAMINOPHEN 500 MG PO TABS: 1000.0000 mg | ORAL_TABLET | Freq: Once | ORAL | Status: AC

## 2022-05-22 MED ORDER — CHLORTHALIDONE 25 MG PO TABS
12.5000 mg | ORAL_TABLET | Freq: Every day | ORAL | Status: DC
  Administered 2022-05-22 – 2022-05-24 (×2): 12.5 mg via ORAL
  Filled 2022-05-22 (×3): qty 0.5

## 2022-05-22 MED ORDER — ONDANSETRON HCL 4 MG/2ML IJ SOLN
4.0000 mg | Freq: Four times a day (QID) | INTRAMUSCULAR | Status: DC | PRN
  Administered 2022-05-22: 4 mg via INTRAVENOUS
  Filled 2022-05-22 (×2): qty 2

## 2022-05-22 MED ORDER — 0.9 % SODIUM CHLORIDE (POUR BTL) OPTIME
TOPICAL | Status: DC | PRN
  Administered 2022-05-22 (×5): 1000 mL

## 2022-05-22 MED ORDER — CEFAZOLIN SODIUM-DEXTROSE 2-4 GM/100ML-% IV SOLN
2.0000 g | Freq: Four times a day (QID) | INTRAVENOUS | Status: AC
  Administered 2022-05-22 (×2): 2 g via INTRAVENOUS
  Filled 2022-05-22 (×2): qty 100

## 2022-05-22 MED ORDER — PHENYLEPHRINE HCL-NACL 20-0.9 MG/250ML-% IV SOLN
INTRAVENOUS | Status: DC | PRN
  Administered 2022-05-22: 25 ug/min via INTRAVENOUS

## 2022-05-22 MED ORDER — PHENYLEPHRINE 80 MCG/ML (10ML) SYRINGE FOR IV PUSH (FOR BLOOD PRESSURE SUPPORT)
PREFILLED_SYRINGE | INTRAVENOUS | Status: DC | PRN
  Administered 2022-05-22: 80 ug via INTRAVENOUS
  Administered 2022-05-22: 160 ug via INTRAVENOUS
  Administered 2022-05-22: 80 ug via INTRAVENOUS
  Administered 2022-05-22: 160 ug via INTRAVENOUS
  Administered 2022-05-22: 80 ug via INTRAVENOUS

## 2022-05-22 MED ORDER — SODIUM CHLORIDE 0.9 % IV SOLN: INTRAVENOUS | Status: AC

## 2022-05-22 MED ORDER — BUPIVACAINE LIPOSOME 1.3 % IJ SUSP
INTRAMUSCULAR | Status: AC
  Filled 2022-05-22: qty 20

## 2022-05-22 MED ORDER — BUPIVACAINE HCL (PF) 0.25 % IJ SOLN
INTRAMUSCULAR | Status: AC
  Filled 2022-05-22: qty 30

## 2022-05-22 MED ORDER — DOCUSATE SODIUM 100 MG PO CAPS
100.0000 mg | ORAL_CAPSULE | Freq: Two times a day (BID) | ORAL | Status: DC
  Administered 2022-05-22 – 2022-05-24 (×5): 100 mg via ORAL
  Filled 2022-05-22 (×5): qty 1

## 2022-05-22 MED ORDER — MENTHOL 3 MG MT LOZG: 1.0000 | LOZENGE | OROMUCOSAL | Status: DC | PRN

## 2022-05-22 MED ORDER — PROPOFOL 10 MG/ML IV BOLUS
INTRAVENOUS | Status: AC
  Filled 2022-05-22: qty 20

## 2022-05-22 MED ORDER — METHOCARBAMOL 500 MG PO TABS
500.0000 mg | ORAL_TABLET | Freq: Four times a day (QID) | ORAL | Status: DC | PRN
  Administered 2022-05-22 – 2022-05-23 (×2): 500 mg via ORAL
  Filled 2022-05-22 (×2): qty 1

## 2022-05-22 MED ORDER — GABAPENTIN 300 MG PO CAPS
300.0000 mg | ORAL_CAPSULE | Freq: Every day | ORAL | Status: DC
  Administered 2022-05-22 – 2022-05-23 (×2): 300 mg via ORAL
  Filled 2022-05-22 (×2): qty 1

## 2022-05-22 MED ORDER — OXYCODONE HCL 5 MG PO TABS
5.0000 mg | ORAL_TABLET | ORAL | Status: DC | PRN
  Administered 2022-05-22: 5 mg via ORAL
  Filled 2022-05-22: qty 1
  Filled 2022-05-22: qty 2

## 2022-05-22 MED ORDER — MORPHINE SULFATE 4 MG/ML IJ SOLN
INTRAMUSCULAR | Status: DC | PRN
  Administered 2022-05-22: 13 mL

## 2022-05-22 MED ORDER — MORPHINE SULFATE (PF) 4 MG/ML IV SOLN
INTRAVENOUS | Status: AC
  Filled 2022-05-22: qty 2

## 2022-05-22 MED ORDER — METOCLOPRAMIDE HCL 5 MG/ML IJ SOLN
5.0000 mg | Freq: Three times a day (TID) | INTRAMUSCULAR | Status: DC | PRN
  Administered 2022-05-22: 10 mg via INTRAVENOUS
  Filled 2022-05-22: qty 2

## 2022-05-22 MED ORDER — SODIUM CHLORIDE (PF) 0.9 % IJ SOLN
INTRAMUSCULAR | Status: DC | PRN
  Administered 2022-05-22: 60 mL

## 2022-05-22 MED ORDER — ONDANSETRON HCL 4 MG/2ML IJ SOLN: 4.0000 mg | Freq: Once | INTRAMUSCULAR | Status: DC | PRN

## 2022-05-22 MED ORDER — MAGNESIUM GLUCONATE 500 MG PO TABS
500.0000 mg | ORAL_TABLET | Freq: Every evening | ORAL | Status: DC
  Administered 2022-05-22 – 2022-05-23 (×2): 500 mg via ORAL
  Filled 2022-05-22 (×3): qty 1

## 2022-05-22 MED ORDER — LOSARTAN POTASSIUM 50 MG PO TABS
50.0000 mg | ORAL_TABLET | Freq: Every day | ORAL | Status: DC
  Administered 2022-05-22 – 2022-05-24 (×2): 50 mg via ORAL
  Filled 2022-05-22 (×3): qty 1

## 2022-05-22 MED ORDER — TRANEXAMIC ACID-NACL 1000-0.7 MG/100ML-% IV SOLN: 1000.0000 mg | INTRAVENOUS | Status: DC

## 2022-05-22 MED ORDER — ONDANSETRON HCL 4 MG PO TABS: 4.0000 mg | ORAL_TABLET | Freq: Four times a day (QID) | ORAL | Status: DC | PRN

## 2022-05-22 MED ORDER — TRANEXAMIC ACID 1000 MG/10ML IV SOLN: 2000.0000 mg | Freq: Once | INTRAVENOUS | Status: DC

## 2022-05-22 MED ORDER — DEXAMETHASONE SODIUM PHOSPHATE 10 MG/ML IJ SOLN
INTRAMUSCULAR | Status: DC | PRN
  Administered 2022-05-22: 8 mg via INTRAVENOUS

## 2022-05-22 MED ORDER — NALTREXONE HCL 50 MG PO TABS: 25.0000 mg | ORAL_TABLET | Freq: Every day | ORAL | Status: DC

## 2022-05-22 MED ORDER — POVIDONE-IODINE 10 % EX SWAB
2.0000 | Freq: Once | CUTANEOUS | Status: AC
  Administered 2022-05-22: 2 via TOPICAL

## 2022-05-22 MED ORDER — CHLORHEXIDINE GLUCONATE 0.12 % MT SOLN
15.0000 mL | Freq: Once | OROMUCOSAL | Status: AC
  Administered 2022-05-22: 15 mL via OROMUCOSAL
  Filled 2022-05-22: qty 15

## 2022-05-22 MED ORDER — ASPIRIN 81 MG PO CHEW
81.0000 mg | CHEWABLE_TABLET | Freq: Two times a day (BID) | ORAL | Status: DC
  Administered 2022-05-22 – 2022-05-24 (×4): 81 mg via ORAL
  Filled 2022-05-22 (×4): qty 1

## 2022-05-22 MED ORDER — CLONIDINE HCL (ANALGESIA) 100 MCG/ML EP SOLN
EPIDURAL | Status: AC
  Filled 2022-05-22: qty 10

## 2022-05-22 MED ORDER — IRRISEPT - 450ML BOTTLE WITH 0.05% CHG IN STERILE WATER, USP 99.95% OPTIME
TOPICAL | Status: DC | PRN
  Administered 2022-05-22: 450 mL via TOPICAL

## 2022-05-22 MED ORDER — OXYCODONE HCL 5 MG PO TABS
ORAL_TABLET | ORAL | Status: AC
  Filled 2022-05-22: qty 1

## 2022-05-22 MED ORDER — POVIDONE-IODINE 10 % EX SWAB: 2.0000 | Freq: Once | CUTANEOUS | Status: DC

## 2022-05-22 MED ORDER — METHOCARBAMOL 1000 MG/10ML IJ SOLN: 500.0000 mg | Freq: Four times a day (QID) | INTRAVENOUS | Status: DC | PRN

## 2022-05-22 MED ORDER — ATORVASTATIN CALCIUM 80 MG PO TABS
80.0000 mg | ORAL_TABLET | Freq: Every day | ORAL | Status: DC
  Administered 2022-05-23 – 2022-05-24 (×2): 80 mg via ORAL
  Filled 2022-05-22 (×2): qty 1

## 2022-05-22 MED ORDER — FENTANYL CITRATE (PF) 250 MCG/5ML IJ SOLN
INTRAMUSCULAR | Status: AC
  Filled 2022-05-22: qty 5

## 2022-05-22 MED ORDER — ONDANSETRON HCL 4 MG/2ML IJ SOLN
INTRAMUSCULAR | Status: AC
  Filled 2022-05-22: qty 2

## 2022-05-22 MED ORDER — BUPIVACAINE IN DEXTROSE 0.75-8.25 % IT SOLN
INTRATHECAL | Status: DC | PRN
  Administered 2022-05-22: 1.7 mL via INTRATHECAL

## 2022-05-22 MED ORDER — LIDOCAINE 2% (20 MG/ML) 5 ML SYRINGE
INTRAMUSCULAR | Status: AC
  Filled 2022-05-22: qty 5

## 2022-05-22 MED ORDER — SODIUM CHLORIDE 0.9 % IR SOLN
Status: DC | PRN
  Administered 2022-05-22: 3000 mL

## 2022-05-22 MED ORDER — HYDROMORPHONE HCL 1 MG/ML IJ SOLN: 0.5000 mg | INTRAMUSCULAR | Status: DC | PRN

## 2022-05-22 MED ORDER — CEFAZOLIN SODIUM-DEXTROSE 2-4 GM/100ML-% IV SOLN
2.0000 g | INTRAVENOUS | Status: AC
  Administered 2022-05-22: 2 g via INTRAVENOUS
  Filled 2022-05-22: qty 100

## 2022-05-22 MED ORDER — ACETAMINOPHEN 500 MG PO TABS
ORAL_TABLET | ORAL | Status: AC
  Administered 2022-05-22: 1000 mg via ORAL
  Filled 2022-05-22: qty 2

## 2022-05-22 SURGICAL SUPPLY — 89 items
BAG COUNTER SPONGE SURGICOUNT (BAG) ×2 IMPLANT
BAG DECANTER FOR FLEXI CONT (MISCELLANEOUS) ×2 IMPLANT
BAG SPNG CNTER NS LX DISP (BAG) ×1
BANDAGE ESMARK 6X9 LF (GAUZE/BANDAGES/DRESSINGS) ×2 IMPLANT
BLADE SAG 18X100X1.27 (BLADE) ×2 IMPLANT
BLADE SAGITTAL (BLADE) ×1
BLADE SAW THK.89X75X18XSGTL (BLADE) ×2 IMPLANT
BNDG CMPR 9X6 STRL LF SNTH (GAUZE/BANDAGES/DRESSINGS) ×1
BNDG CMPR MED 15X6 ELC VLCR LF (GAUZE/BANDAGES/DRESSINGS) ×1
BNDG COHESIVE 6X5 TAN STRL LF (GAUZE/BANDAGES/DRESSINGS) ×2 IMPLANT
BNDG ELASTIC 6X15 VLCR STRL LF (GAUZE/BANDAGES/DRESSINGS) ×2 IMPLANT
BNDG ESMARK 6X9 LF (GAUZE/BANDAGES/DRESSINGS) ×1
BOWL SMART MIX CTS (DISPOSABLE) IMPLANT
CLSR STERI-STRIP ANTIMIC 1/2X4 (GAUZE/BANDAGES/DRESSINGS) IMPLANT
CNTNR URN SCR LID CUP LEK RST (MISCELLANEOUS) ×2 IMPLANT
COMP FEM KNEE PS STD 5 LT (Joint) ×1 IMPLANT
COMPONENT FEM KNEE PS STD 5 LT (Joint) IMPLANT
CONT SPEC 4OZ STRL OR WHT (MISCELLANEOUS) ×1
COOLER ICEMAN CLASSIC (MISCELLANEOUS) IMPLANT
COVER SURGICAL LIGHT HANDLE (MISCELLANEOUS) ×2 IMPLANT
CUFF TOURN SGL QUICK 34 (TOURNIQUET CUFF) ×1
CUFF TOURN SGL QUICK 42 (TOURNIQUET CUFF) IMPLANT
CUFF TRNQT CYL 34X4.125X (TOURNIQUET CUFF) ×2 IMPLANT
DRAPE INCISE IOBAN 66X45 STRL (DRAPES) IMPLANT
DRAPE ORTHO SPLIT 77X108 STRL (DRAPES) ×3
DRAPE SURG ORHT 6 SPLT 77X108 (DRAPES) ×6 IMPLANT
DRAPE U-SHAPE 47X51 STRL (DRAPES) ×2 IMPLANT
DRSG AQUACEL AG ADV 3.5X14 (GAUZE/BANDAGES/DRESSINGS) IMPLANT
DRSG XEROFORM 1X8 (GAUZE/BANDAGES/DRESSINGS) IMPLANT
DURAPREP 26ML APPLICATOR (WOUND CARE) ×4 IMPLANT
ELECT CAUTERY BLADE 6.4 (BLADE) ×2 IMPLANT
ELECT REM PT RETURN 9FT ADLT (ELECTROSURGICAL) ×1
ELECTRODE REM PT RTRN 9FT ADLT (ELECTROSURGICAL) ×2 IMPLANT
GAUZE SPONGE 4X4 12PLY STRL (GAUZE/BANDAGES/DRESSINGS) ×2 IMPLANT
GLOVE BIOGEL PI IND STRL 7.0 (GLOVE) ×2 IMPLANT
GLOVE BIOGEL PI IND STRL 8 (GLOVE) ×2 IMPLANT
GLOVE ECLIPSE 7.0 STRL STRAW (GLOVE) ×2 IMPLANT
GLOVE ECLIPSE 8.0 STRL XLNG CF (GLOVE) ×2 IMPLANT
GLOVE SURG ENC MOIS LTX SZ6.5 (GLOVE) ×6 IMPLANT
GOWN STRL REUS W/ TWL LRG LVL3 (GOWN DISPOSABLE) ×6 IMPLANT
GOWN STRL REUS W/TWL LRG LVL3 (GOWN DISPOSABLE) ×3
HANDPIECE INTERPULSE COAX TIP (DISPOSABLE) ×1
HDLS TROCR DRIL PIN KNEE 75 (PIN) ×4
HOOD PEEL AWAY FLYTE STAYCOOL (MISCELLANEOUS) ×6 IMPLANT
IMMOBILIZER KNEE 20 (SOFTGOODS)
IMMOBILIZER KNEE 20 THIGH 36 (SOFTGOODS) IMPLANT
IMMOBILIZER KNEE 22 UNIV (SOFTGOODS) IMPLANT
IMMOBILIZER KNEE 24 THIGH 36 (MISCELLANEOUS) IMPLANT
IMMOBILIZER KNEE 24 UNIV (MISCELLANEOUS)
IMPL PATELLA METAL SZ32X10 (Joint) IMPLANT
KIT BASIN OR (CUSTOM PROCEDURE TRAY) ×2 IMPLANT
KIT TURNOVER KIT B (KITS) ×2 IMPLANT
LINER TIB ASF PS EF/4-5 11 LT (Liner) IMPLANT
MANIFOLD NEPTUNE II (INSTRUMENTS) ×2 IMPLANT
NDL 22X1.5 STRL (OR ONLY) (MISCELLANEOUS) ×4 IMPLANT
NDL SPNL 18GX3.5 QUINCKE PK (NEEDLE) ×2 IMPLANT
NEEDLE 22X1.5 STRL (OR ONLY) (MISCELLANEOUS) ×2 IMPLANT
NEEDLE HYPO 22GX1.5 SAFETY (NEEDLE) IMPLANT
NEEDLE SPNL 18GX3.5 QUINCKE PK (NEEDLE) ×1 IMPLANT
NS IRRIG 1000ML POUR BTL (IV SOLUTION) ×4 IMPLANT
PACK TOTAL JOINT (CUSTOM PROCEDURE TRAY) ×2 IMPLANT
PAD ARMBOARD 7.5X6 YLW CONV (MISCELLANEOUS) ×4 IMPLANT
PAD CAST 4YDX4 CTTN HI CHSV (CAST SUPPLIES) ×2 IMPLANT
PAD COLD SHLDR WRAP-ON (PAD) IMPLANT
PADDING CAST COTTON 4X4 STRL (CAST SUPPLIES)
PADDING CAST COTTON 6X4 STRL (CAST SUPPLIES) ×2 IMPLANT
PIN DRILL HDLS TROCAR 75 4PK (PIN) IMPLANT
SCREW FEMALE HEX FIX 25X2.5 (ORTHOPEDIC DISPOSABLE SUPPLIES) IMPLANT
SET HNDPC FAN SPRY TIP SCT (DISPOSABLE) ×2 IMPLANT
SPIKE FLUID TRANSFER (MISCELLANEOUS) ×2 IMPLANT
STEM TIB PERS SZ E 5D LT (Screw) IMPLANT
STRIP CLOSURE SKIN 1/2X4 (GAUZE/BANDAGES/DRESSINGS) ×4 IMPLANT
SUCTION FRAZIER HANDLE 10FR (MISCELLANEOUS) ×1
SUCTION TUBE FRAZIER 10FR DISP (MISCELLANEOUS) ×2 IMPLANT
SUT MNCRL AB 3-0 PS2 18 (SUTURE) ×2 IMPLANT
SUT VIC AB 0 CT1 27 (SUTURE) ×2
SUT VIC AB 0 CT1 27XBRD ANBCTR (SUTURE) ×6 IMPLANT
SUT VIC AB 1 CT1 36 (SUTURE) ×10 IMPLANT
SUT VIC AB 1 CTX 36 (SUTURE) ×2
SUT VIC AB 1 CTX36XBRD ANBCTRL (SUTURE) IMPLANT
SUT VIC AB 2-0 CT1 27 (SUTURE) ×2
SUT VIC AB 2-0 CT1 TAPERPNT 27 (SUTURE) ×8 IMPLANT
SYR 30ML LL (SYRINGE) ×6 IMPLANT
SYR TB 1ML LUER SLIP (SYRINGE) ×2 IMPLANT
TOWEL GREEN STERILE (TOWEL DISPOSABLE) ×4 IMPLANT
TOWEL GREEN STERILE FF (TOWEL DISPOSABLE) ×4 IMPLANT
TRAY CATH 16FR W/PLASTIC CATH (SET/KITS/TRAYS/PACK) IMPLANT
WATER STERILE IRR 1000ML POUR (IV SOLUTION) IMPLANT
YANKAUER SUCT BULB TIP NO VENT (SUCTIONS) ×2 IMPLANT

## 2022-05-22 NOTE — Transfer of Care (Signed)
Immediate Anesthesia Transfer of Care Note  Patient: Veronica Weber  Procedure(s) Performed: LEFT TOTAL KNEE ARTHROPLASTY (Left: Knee)  Patient Location: PACU  Anesthesia Type:General  Level of Consciousness: awake, oriented, and patient cooperative  Airway & Oxygen Therapy: Patient Spontanous Breathing and Patient connected to face mask oxygen  Post-op Assessment: Report given to RN and Post -op Vital signs reviewed and stable  Post vital signs: Reviewed  Last Vitals:  Vitals Value Taken Time  BP    Temp    Pulse    Resp    SpO2      Last Pain:  Vitals:   05/22/22 0652  TempSrc:   PainSc: 4          Complications: No notable events documented.

## 2022-05-22 NOTE — Progress Notes (Signed)
Orthopedic Tech Progress Note Patient Details:  Veronica Weber [birth year] [id number]  CPM Left Knee CPM Left Knee: On Left Knee Flexion (Degrees): 40 Left Knee Extension (Degrees): 10  Post Interventions Patient Tolerated: Well Instructions Provided: Care of device, Adjustment of device  [name] 05/22/2022, 8:54 PM

## 2022-05-22 NOTE — H&P (Signed)
TOTAL KNEE ADMISSION H&P  Patient is being admitted for left total knee arthroplasty.  Subjective:  Chief Complaint:left knee pain.  HPI: Veronica Weber, 66 y.o. female, has a history of pain and functional disability in the left knee due to arthritis and has failed non-surgical conservative treatments for greater than 12 weeks to includeNSAID's and/or analgesics, corticosteriod injections, flexibility and strengthening excercises, use of assistive devices, and activity modification.  Onset of symptoms was gradual, starting 8 years ago with gradually worsening course since that time. The patient noted no past surgery on the left knee(s).  Patient currently rates pain in the left knee(s) at 9 out of 10 with activity. Patient has night pain, worsening of pain with activity and weight bearing, pain that interferes with activities of daily living, pain with passive range of motion, crepitus, and joint swelling.  Patient has evidence of subchondral sclerosis and joint space narrowing by imaging studies. This patient has had  a long history of nonoperative intervention which has not ameliorated her symptoms.  She did have a stroke in March but has been medically optimized in the 6 months since that stroke for this surgery.  No personal or family history of DVT or pulmonary embolism . There is no active infection.  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   Essential hypertension 11/16/2021   Hyperlipidemia 11/16/2021   Tobacco use 11/16/2021   Syncope and collapse 06/26/2021   Past Medical History:  Diagnosis Date   Abortion    x3   Arthritis    Cataract    Emphysema/COPD ([place])    History of  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   Hyperlipidemia    Hypertension    Thyroid disease    Tobacco use     Past Surgical History:  Procedure Laterality Date   APPENDECTOMY  1976   CATARACT EXTRACTION     1997, [place]    Current Facility-Administered Medications  Medication Dose Route Frequency Provider Last Rate Last Admin   0.9 % irrigation (POUR BTL)    PRN Meredith Pel, MD   1,000 mL at 05/22/22 0626   bupivacaine (PF) (MARCAINE) 10 mL, morphine 4 MG/ML 8 mg, cloNIDine (DURACLON) 100 mcg    PRN Meredith Pel, MD   13 mL at 05/22/22 0620   bupivacaine (PF) (MARCAINE) 20 mL, bupivacaine liposome (EXPAREL) 1.3 % 20 mL, sodium chloride (PF) 0.9 % 20 mL    PRN Meredith Pel, MD   60 mL at 05/22/22 0623   ceFAZolin (ANCEF) IVPB 2g/100 mL premix  2 g Intravenous To SS-Surg Magnant, Charles L, PA-C       chlorhexidine (PERIDEX) 0.12 % solution 15 mL  15 mL Mouth/Throat Once Josephine Igo, MD       Or   Oral care mouth rinse  15 mL Mouth Rinse Once Josephine Igo, MD       lactated ringers infusion   Intravenous Continuous Josephine Igo, MD       povidone-iodine (BETADINE) 7.5 % scrub   Topical Once Magnant, Charles L, PA-C       povidone-iodine 10 % swab 2  Application  2 Application Topical Once Magnant, Charles L, PA-C       povidone-iodine 10 % swab 2 Application  2 Application Topical Once Magnant, Charles L, PA-C       sodium chloride irrigation 0.9 %    PRN [name], MD   3,000 mL at 05/22/22 0626   tranexamic acid (CYKLOKAPRON) 2,000 mg in sodium chloride 0.9 % 50 mL Topical Application  2,000 mg Topical To OR [name], MD       tranexamic acid (CYKLOKAPRON) 2,000 mg in sodium chloride 0.9 % 50 mL Topical Application  2,000 mg Topical Once [name], MD       vancomycin [facility]) powder    PRN [name], MD   1,000 mg at 05/22/22 1696   Not on File   Social History   Tobacco Use   Smoking status: Every Day    Packs/day: 0.25    Years: 45.00    Total pack years: 11.25    Types: Cigarettes    Start date: 06/01/1976   Smokeless tobacco: Never  Substance Use Topics   Alcohol use: Not Currently    Family History  Problem Relation Age of Onset   Arthritis Mother    Cancer Maternal Grandmother    Diabetes Maternal Grandmother    Diabetes Maternal Grandfather    Healthy Daughter    Healthy Son      Review of Systems  Musculoskeletal:  Positive for arthralgias.  All other systems reviewed and are negative.   Objective:  Physical Exam Vitals reviewed.  HENT:     Head: Normocephalic.     Nose: Nose normal.     Mouth/Throat:     Mouth: Mucous membranes are moist.  Cardiovascular:     Rate and Rhythm: Normal rate.     Pulses: Normal pulses.  Pulmonary:     Effort: Pulmonary effort is normal.  Abdominal:     General: Abdomen is flat.  Musculoskeletal:     Cervical back: Normal range of motion.  Skin:    General: Skin is warm.     Capillary Refill: Capillary refill takes less than 2 seconds.  Neurological:     General: No focal deficit present.     Mental Status: She is alert.  Psychiatric:        Mood and Affect: Mood normal.   Left knee demonstrates intact ankle dorsiflexion.  Pedal pulses palpable.  Range of motion is about 7 to 100 degrees.  Collaterals are stable.  No groin pain with internal or external rotation of that left leg.  Vital signs in last 24 hours: Temp:  [98.3 F (36.8 C)] 98.3 F (36.8 C) (09/21 0609) Pulse Rate:  [78] 78 (09/21 0609) Resp:  [17] 17 (09/21 0609) BP: (123)/(71) 123/71 (09/21 0609) SpO2:  [96 %] 96 % (09/21 0609) Weight:  [95.3 kg] 95.3 kg (09/21 0609)  Labs:   Estimated body mass index is 37.2 kg/m as calculated from the following:   Height as of this encounter: 5\' 3"  (1.6 m).   Weight as of this encounter: 95.3 kg.   Imaging Review Plain radiographs demonstrate  severe degenerative joint disease of the left knee(s). The overall alignment ismild varus. The bone quality appears to be fair for age and reported activity level.      Assessment/Plan:  End stage arthritis, left knee   The patient history, physical examination, clinical judgment of the provider and imaging studies are consistent with end  stage degenerative joint disease of the left knee(s) and total knee arthroplasty is deemed medically necessary. The treatment options including medical management, injection therapy arthroscopy and arthroplasty were discussed at length. The risks and benefits of total knee arthroplasty were presented and reviewed. The risks due to aseptic loosening, infection, stiffness, patella tracking problems, thromboembolic complications and other imponderables were discussed. The patient acknowledged the explanation, agreed to proceed with the plan and consent was signed. Patient is being admitted for inpatient treatment for surgery, pain control, PT, OT, prophylactic antibiotics, VTE prophylaxis, progressive ambulation and ADL's and discharge planning. The patient is planning to be discharged home with home health services     Patient's anticipated LOS is less than 2 midnights, meeting these requirements: - Younger than 26 - Lives within 1 hour of care - Has a competent adult at home to recover with post-op recover - NO history of  - Chronic pain requiring opiods  - Diabetes  - Coronary Artery Disease  - Heart failure  - Heart attack  - Stroke  - DVT/VTE  - Cardiac arrhythmia  - Respiratory Failure/COPD  - Renal failure  - Anemia  - Advanced Liver disease

## 2022-05-22 NOTE — Progress Notes (Signed)
Pacu RN Report to floor given  Gave report to  [employer]. Room: 1O17   Discussed surgery, meds given in OR and Pacu, VS, IV fluids given, EBL, urine output, pain and other pertinent information. Also discussed if pt had any family or friends here or belongings with them.   Discussed VSS, dressing, pain meds given, ice man. Daughter updated.   Pt exits my care.

## 2022-05-22 NOTE — Op Note (Signed)
NAMECYNDEL, Veronica Weber MEDICAL RECORD NO: [id number] ACCOUNT NO: [id number] DATE OF BIRTH: 11-Nov-1955 FACILITY: [facility] LOCATION: [facility] PHYSICIAN: Graylin Shiver. August Saucer, MD  Operative Report   DATE OF PROCEDURE: 05/22/2022  PREOPERATIVE DIAGNOSIS:  Left knee arthritis.  POSTOPERATIVE DIAGNOSIS:  Left knee arthritis.  PROCEDURE:  Press-fit left total knee replacement using Persona cruciate-retaining size 5 femur, size E press-fit tibia, 11 mm highly cross-linked medial congruent polyethylene liner with 32 mm press-fit patella.  SURGEON:  Graylin Shiver. August Saucer, MD  ASSISTANT:  April Green, RNFA.  INDICATIONS:  This is a 66 year old patient with end-stage left knee arthritis, who presents for operative management after explanation of risks and benefits.  DESCRIPTION OF PROCEDURE:  The patient was brought to the operating room where spinal anesthetic was induced.  Preoperative antibiotics administered.  Timeout was called.  Left leg was pre-scrubbed with alcohol and Betadine, allowed to air dry, then  prepped with DuraPrep solution and draped in sterile manner.  Ioban used to cover the operative field.  The leg was elevated and exsanguinated with the Esmarch wrap.  Tourniquet was inflated.  After calling timeout anterior approach to the knee was made.   Skin and subcutaneous tissue was sharply divided.  Median parapatellar approach was made and marked with #1 Vicryl suture.  IrriSept solution utilized.  Fat pad partially excised.  Anterior horn lateral meniscus released, ACL released.  Osteophytes  were removed.  Soft tissue from the anterior distal femur was removed and the lateral patellofemoral ligament was released.  Medial soft tissue dissection around the proximal tibia was performed proportional to the patient's mild to moderate preoperative  varus deformity.  With the collaterals and posterior neurovascular structures protected intramedullary alignment was used to make initially a cut of 2  mm off the most affected medial tibial plateau, which was later revised 2 more millimeters.   Intramedullary alignment was then used to make a cut off the distal femur in 5 degrees of valgus of 10 mm.  With these cuts made we were able to achieve full extension with a 10 mm spacer.  Next, the femur was sized to a size 5, femur was cut in 3  degrees of external rotation with a collaterals protected.  The anterior, posterior and chamfer cuts were made.  No notching performed.  Tibia was then sized to a size E.  Correct rotational alignment was confirmed and the baseplate was screwed into  position.  At this time, the patella was cut down from 24 to 14 mm.  A 10 mm patellar trial was placed.  A 10 and 11 mm spacer was then used and with the 11 mm spacer the patient achieved full extension, full flexion, excellent patellar tracking using no  thumbs technique and good collateral ligament stability at 0, 30 and 90 degrees of flexion.  The trial tibia and patella component was removed.  Tibia was keel punched.  Bone quality was excellent.  Thorough irrigation with 3 liters of irrigating  solution performed.  Capsule anesthetized using a combination of Marcaine, Exparel and saline.  Next TXA sponge and IrriSept solution was used to sit in the knee for 3 minutes and then that was removed.  Vancomycin powder was then placed in the tibial  canal.  The tibia was then tapped into position with good press fit obtained.  The femur was then tapped into position with good press fit obtained.  Liner was placed.  Patellar button was then placed with good press fit obtained.  At  this time, the  patient had full extension, full flexion, and excellent patellar tracking using no thumbs technique with good stability to varus and valgus stress at 0, 30 and 90 degrees. Tourniquet was released.  Pouring irrigation utilized x4 liters.  Next, the  arthrotomy was closed over a bolster after coagulating bleeding points.  This was done  using #1 Vicryl suture.  Prior to full arthrotomy closure IrriSept solution used to irrigate out the knee and then vancomycin powder placed.  The knee arthrotomy was  closed and the knee joint was injected with the solution of Marcaine, morphine and Clonidine.  Next, irrigating solution was performed superior to the arthrotomy closure.  Vancomycin powder placed and then the skin was closed using 0 Vicryl suture, 2-0  Vicryl suture, and 3-0 Monocryl with Steri-Strips and Aquacel dressing applied followed by Ace wrap, iceman and knee immobilizer.  The patient tolerated the procedure well without immediate complications, transferred to the recovery room in stable  condition.   PUS D: 05/22/2022 10:54:58 am T: 05/22/2022 11:36:00 am  JOB: [id number]/ [id number]

## 2022-05-22 NOTE — Progress Notes (Signed)
Orthopedic Tech Progress Note Patient Details:  Veronica Weber 1955-10-10 [id number]  CPM was put on the pt, bone foam was left at bedside.  CPM Left Knee CPM Left Knee: On Left Knee Flexion (Degrees): 40 Left Knee Extension (Degrees): 10  Post Interventions Patient Tolerated: Well Instructions Provided: Care of device, Adjustment of device Ortho Devices Type of Ortho Device: Bone foam zero knee Ortho Device/Splint Location: LLE Ortho Device/Splint Interventions: Ordered   Post Interventions Patient Tolerated: Well Instructions Provided: Care of device, Adjustment of device  Arville Go 05/22/2022, 2:00 PM

## 2022-05-22 NOTE — Brief Op Note (Signed)
   05/22/2022  10:48 AM  PATIENT:  Antionette Char  66 y.o. female  PRE-OPERATIVE DIAGNOSIS:  left knee osteoarthritis  POST-OPERATIVE DIAGNOSIS:  left knee osteoarthritis  PROCEDURE:  Procedure(s): LEFT TOTAL KNEE ARTHROPLASTY  SURGEON:  Surgeon(s): Meredith Pel, MD  ASSISTANT: green rnfa  ANESTHESIA:   spinal  EBL: 50 ml    Total I/O In: 1900 [I.V.:1800; IV Piggyback:100] Out: 350 [Urine:300; Blood:50]  BLOOD ADMINISTERED: none  DRAINS: none   LOCAL MEDICATIONS USED:  marcaine mso4 clonidine vanco exparel  SPECIMEN:  No Specimen  COUNTS:  YES  TOURNIQUET:   Total Tourniquet Time Documented: Thigh (Left) - 78 minutes Total: Thigh (Left) - 78 minutes   DICTATION: .Other Dictation: Dictation Number [id number]  PLAN OF CARE: Admit for overnight observation  PATIENT DISPOSITION:  PACU - hemodynamically stable

## 2022-05-22 NOTE — Anesthesia Postprocedure Evaluation (Signed)
Anesthesia Post Note  Patient: Veronica Weber  Procedure(s) Performed: LEFT TOTAL KNEE ARTHROPLASTY (Left: Knee)     Patient location during evaluation: PACU Anesthesia Type: Spinal Level of consciousness: oriented and awake and alert Pain management: pain level controlled Vital Signs Assessment: post-procedure vital signs reviewed and stable Respiratory status: spontaneous breathing, respiratory function stable and nonlabored ventilation Cardiovascular status: blood pressure returned to baseline and stable Postop Assessment: no headache, no backache, no apparent nausea or vomiting, spinal receding and patient able to bend at knees Anesthetic complications: no   No notable events documented.  Last Vitals:  Vitals:   05/22/22 1110 05/22/22 1115  BP:  124/78  Pulse: 84 85  Resp: 11 (!) 9  Temp:    SpO2: 96% (!) 88%    Last Pain:  Vitals:   05/22/22 1115  TempSrc:   PainSc: Asleep                 Abryanna Musolino A.

## 2022-05-22 NOTE — Evaluation (Signed)
Physical Therapy Evaluation Patient Details Name: Veronica Weber MRN: [id number] DOB: April 05, 1956 Today's Date: 05/22/2022  History of Present Illness  Pt is a 66 y/o female s/p L TKA. PMH includes HTN, CVA, and tobacco use.  Clinical Impression  Pt admitted secondary to problem above with deficits below. Pt requiring min guard A to stand and transfer to/from [facility] this session. Increased pain which limited further mobility. Educated about knee precautions. Will continue to follow acutely.        Recommendations for follow up therapy are one component of a multi-disciplinary discharge planning process, led by the attending physician.  Recommendations may be updated based on patient status, additional functional criteria and insurance authorization.  Follow Up Recommendations Follow physician's recommendations for discharge plan and follow up therapies      Assistance Recommended at Discharge Intermittent Supervision/Assistance  Patient can return home with the following  A lot of help with bathing/dressing/bathroom;Assistance with cooking/housework;Help with stairs or ramp for entrance;Assist for transportation    Equipment Recommendations None recommended by PT  Recommendations for Other Services       Functional Status Assessment Patient has had a recent decline in their functional status and demonstrates the ability to make significant improvements in function in a reasonable and predictable amount of time.     Precautions / Restrictions Precautions Precautions: Knee Precaution Booklet Issued: No Precaution Comments: Reviewed knee precautions with pt. Restrictions Weight Bearing Restrictions: Yes LLE Weight Bearing: Weight bearing as tolerated      Mobility  Bed Mobility Overal bed mobility: Needs Assistance Bed Mobility: Supine to Sit     Supine to sit: Min assist     General bed mobility comments: Min A for trunk elevation to come to sitting     Transfers Overall transfer level: Needs assistance Equipment used: Rolling walker (2 wheels) Transfers: Sit to/from Stand, Bed to chair/wheelchair/BSC Sit to Stand: Min guard Stand pivot transfers: Min guard         General transfer comment: Min guard for safety to stand and transfer to [facility] and then to chair. Pt with increased pain, so further mobility deferred. Cues for sequencing.    Ambulation/Gait                  Stairs            Wheelchair Mobility    Modified Rankin (Stroke Patients Only)       Balance Overall balance assessment: Needs assistance Sitting-balance support: No upper extremity supported, Feet supported Sitting balance-Leahy Scale: Good     Standing balance support: Bilateral upper extremity supported Standing balance-Leahy Scale: Poor Standing balance comment: Reliant on BUE support                             Pertinent Vitals/Pain Pain Assessment Pain Assessment: Faces Faces Pain Scale: Hurts even more Pain Location: L knee Pain Descriptors / Indicators: Grimacing, Guarding Pain Intervention(s): Limited activity within patient's tolerance, Monitored during session, Repositioned    Home Living Family/patient expects to be discharged to:: Private residence Living Arrangements: Alone Available Help at Discharge: Family;Available 24 hours/day Type of Home: House Home Access: Stairs to enter Entrance Stairs-Rails: Right;Left;Can reach both Entrance Stairs-Number of Steps: 6   Home Layout: One level Home Equipment: Conservation officer, nature (2 wheels);Cane - single point;BSC/3in1      Prior Function Prior Level of Function : Independent/Modified Independent;Driving  Mobility Comments: Occasional use of cane       Hand Dominance        Extremity/Trunk Assessment   Upper Extremity Assessment Upper Extremity Assessment: Overall WFL for tasks assessed    Lower Extremity Assessment Lower Extremity  Assessment: LLE deficits/detail LLE Deficits / Details: Deficits consistent with post op pain and weakness.    Cervical / Trunk Assessment Cervical / Trunk Assessment: Normal  Communication   Communication: No difficulties  Cognition Arousal/Alertness: Awake/alert Behavior During Therapy: WFL for tasks assessed/performed Overall Cognitive Status: Within Functional Limits for tasks assessed                                          General Comments      Exercises     Assessment/Plan    PT Assessment Patient needs continued PT services  PT Problem List Decreased strength;Decreased range of motion;Decreased activity tolerance;Decreased balance;Decreased mobility;Decreased knowledge of use of DME;Decreased knowledge of precautions;Pain       PT Treatment Interventions DME instruction;Functional mobility training;Stair training;Gait training;Therapeutic exercise;Therapeutic activities;Balance training;Patient/family education    PT Goals (Current goals can be found in the Care Plan section)  Acute Rehab PT Goals Patient Stated Goal: to decrease pain PT Goal Formulation: With patient Time For Goal Achievement: 06/05/22 Potential to Achieve Goals: Good    Frequency 7X/week     Co-evaluation               AM-PAC PT "6 Clicks" Mobility  Outcome Measure Help needed turning from your back to your side while in a flat bed without using bedrails?: A Little Help needed moving from lying on your back to sitting on the side of a flat bed without using bedrails?: A Little Help needed moving to and from a bed to a chair (including a wheelchair)?: A Little Help needed standing up from a chair using your arms (e.g., wheelchair or bedside chair)?: A Little Help needed to walk in hospital room?: A Little Help needed climbing 3-5 steps with a railing? : A Lot 6 Click Score: 17    End of Session Equipment Utilized During Treatment: Gait belt Activity Tolerance:  Patient limited by pain Patient left: in chair;with call bell/phone within reach;with family/visitor present Nurse Communication: Mobility status PT Visit Diagnosis: Other abnormalities of gait and mobility (R26.89);Difficulty in walking, not elsewhere classified (R26.2);Pain Pain - Right/Left: Left Pain - part of body: Knee    Time: [id number] PT Time Calculation (min) (ACUTE ONLY): 21 min   Charges:   PT Evaluation $PT Eval Low Complexity: 1 Low          Reuel Derby, PT, DPT  Acute Rehabilitation Services  Office: [phone]   Rudean Hitt 05/22/2022, 4:21 PM

## 2022-05-22 NOTE — Telephone Encounter (Signed)
Completed handicap placard and called pt to advise; lvm that it was in front office ready for pick up at her convenience. Left cb number incase needing anything further.

## 2022-05-22 NOTE — Anesthesia Procedure Notes (Signed)
Spinal  Patient location during procedure: OR Start time: 05/22/2022 7:43 AM End time: 05/22/2022 7:47 AM Reason for block: surgical anesthesia Staffing Anesthesiologist: Josephine Igo, MD Performed by: Josephine Igo, MD Authorized by: Josephine Igo, MD   Preanesthetic Checklist Completed: patient identified, IV checked, site marked, risks and benefits discussed, surgical consent, monitors and equipment checked, pre-op evaluation and timeout performed Spinal Block Patient position: sitting Prep: DuraPrep and site prepped and draped Patient monitoring: heart rate, cardiac monitor, continuous pulse ox and blood pressure Approach: midline Location: L3-4 Injection technique: single-shot Needle Needle type: Pencan  Needle gauge: 24 G Needle length: 9 cm Needle insertion depth: 7 cm Assessment Sensory level: T4 Events: CSF return Additional Notes Patient tolerated procedure well. Adequate sensory level. Technically difficult. Attempt x 3.

## 2022-05-22 NOTE — TOC Initial Note (Signed)
Transition of Care [facility]) - Initial/Assessment Note    Patient Details  Name: CHIYEKO FERRE MRN: [id number] Date of Birth: 1955-10-10  Transition of Care [facility]) CM/SW Contact:    Marilu Favre, RN Phone Number: 05/22/2022, 3:00 PM  Clinical Narrative:                  Dr Randel Pigg office has arranged home health PT with [facility].   Await PT evaluation for DME recommendations etc       Patient Goals and CMS Choice        Expected Discharge Plan and Services                                                Prior Living Arrangements/Services                       Activities of Daily Living      Permission Sought/Granted                  Emotional Assessment              Admission diagnosis:  OA (osteoarthritis) of knee [M17.9] Arthritis of knee [M17.10] Patient Active Problem List   Diagnosis Date Noted   OA (osteoarthritis) of knee 05/22/2022   Arthritis of knee 05/22/2022   Current every day smoker 01/19/2022   Difficulty sleeping 01/19/2022   Obesity, Class III, BMI 40-49.9 (morbid obesity) ([place]) 11/19/2021   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 [place] [address] Phone: [phone] Fax: [phone]  [facility] ([facility]) - [place], [place] [address] Phone: [phone] Fax: [phone]     Social Determinants of Health (SDOH) Interventions    Readmission Risk Interventions     No data to display

## 2022-05-22 NOTE — Anesthesia Procedure Notes (Signed)
Procedure Name: MAC Date/Time: 05/22/2022 7:45 AM  Performed by: Jenne Campus, CRNAPre-anesthesia Checklist: Patient identified, Emergency Drugs available, Suction available and Patient being monitored Oxygen Delivery Method: Simple face mask

## 2022-05-22 NOTE — Anesthesia Procedure Notes (Signed)
Anesthesia Regional Block: Adductor canal block   Pre-Anesthetic Checklist: , timeout performed,  Correct Patient, Correct Site, Correct Laterality,  Correct Procedure, Correct Position, site marked,  Risks and benefits discussed,  Surgical consent,  Pre-op evaluation,  At surgeon's request and post-op pain management  Laterality: Left  Prep: chloraprep       Needles:  Injection technique: Single-shot  Needle Type: Stimiplex     Needle Length: 9cm  Needle Gauge: 21     Additional Needles:   Procedures:,,,, ultrasound used (permanent image in chart),,    Narrative:  Start time: 05/22/2022 7:05 AM End time: 05/22/2022 7:10 AM Injection made incrementally with aspirations every 5 mL.  Performed by: Personally  Anesthesiologist: Lynda Rainwater, MD

## 2022-05-23 ENCOUNTER — Encounter (HOSPITAL_COMMUNITY): Payer: Self-pay | Admitting: Orthopedic Surgery

## 2022-05-23 DIAGNOSIS — M1712 Unilateral primary osteoarthritis, left knee: Secondary | ICD-10-CM | POA: Diagnosis not present

## 2022-05-23 MED ORDER — DOCUSATE SODIUM 100 MG PO CAPS: 100.0000 mg | ORAL_CAPSULE | Freq: Two times a day (BID) | ORAL | 0 refills | Status: DC

## 2022-05-23 MED ORDER — METHOCARBAMOL 500 MG PO TABS: 500.0000 mg | ORAL_TABLET | Freq: Four times a day (QID) | ORAL | 0 refills | Status: DC | PRN

## 2022-05-23 MED ORDER — ONDANSETRON HCL 4 MG PO TABS: 4.0000 mg | ORAL_TABLET | Freq: Four times a day (QID) | ORAL | 0 refills | Status: DC | PRN

## 2022-05-23 MED ORDER — NAPROXEN 250 MG PO TABS: 250.0000 mg | ORAL_TABLET | Freq: Two times a day (BID) | ORAL | 0 refills | Status: DC

## 2022-05-23 MED ORDER — ASPIRIN 81 MG PO CHEW: 81.0000 mg | CHEWABLE_TABLET | Freq: Two times a day (BID) | ORAL | 0 refills | Status: DC

## 2022-05-23 MED ORDER — HYDROCODONE-ACETAMINOPHEN 5-325 MG PO TABS: 1.0000 | ORAL_TABLET | ORAL | 0 refills | Status: DC | PRN

## 2022-05-23 MED ORDER — HYDROCODONE-ACETAMINOPHEN 5-325 MG PO TABS
1.0000 | ORAL_TABLET | ORAL | Status: DC | PRN
  Administered 2022-05-23 (×2): 1 via ORAL
  Administered 2022-05-23 – 2022-05-24 (×3): 2 via ORAL
  Filled 2022-05-23 (×2): qty 2
  Filled 2022-05-23 (×2): qty 1
  Filled 2022-05-23: qty 2

## 2022-05-23 NOTE — Progress Notes (Signed)
  Subjective: Patient stable.  Pain controlled.  Having some nausea with oxycodone   Objective: Vital signs in last 24 hours: Temp:  [97.2 F (36.2 C)-98.2 F (36.8 C)] 97.5 F (36.4 C) (09/22 0606) Pulse Rate:  [56-87] 56 (09/22 0606) Resp:  [9-18] 16 (09/21 1953) BP: (89-126)/(55-87) 89/55 (09/22 0606) SpO2:  [88 %-99 %] 97 % (09/22 0606)  Intake/Output from previous day: 09/21 0701 - 09/22 0700 In: 1900 [I.V.:1800; IV Piggyback:100] Out: 950 [Urine:900; Blood:50] Intake/Output this shift: No intake/output data recorded.  Exam:  Intact pulses distally Dorsiflexion/Plantar flexion intact  Labs: No results for input(s): "HGB" in the last 72 hours. No results for input(s): "WBC", "RBC", "HCT", "PLT" in the last 72 hours. No results for input(s): "NA", "K", "CL", "CO2", "BUN", "CREATININE", "GLUCOSE", "CALCIUM" in the last 72 hours. No results for input(s): "LABPT", "INR" in the last 72 hours.  Assessment/Plan: Plan at this time is physical therapy this morning and this afternoon.  Anticipate discharge either this afternoon or tomorrow morning.  Plan to change oxycodone to hydrocodone.  Discharge paperwork organized for discharge on either date.  Patient has home health physical therapy arranged   Anderson Malta 05/23/2022, 7:16 AM

## 2022-05-23 NOTE — Plan of Care (Signed)
  Problem: Education: Goal: Knowledge of the prescribed therapeutic regimen will improve Outcome: Progressing   Problem: Activity: Goal: Ability to avoid complications of mobility impairment will improve Outcome: Progressing   

## 2022-05-23 NOTE — Progress Notes (Signed)
Physical Therapy Treatment Patient Details Name: Veronica Weber MRN: [id number] DOB: 1955/12/25 Today's Date: 05/23/2022   History of Present Illness Pt is a 66 y/o female s/p L TKA. PMH includes HTN, CVA, and tobacco use.    PT Comments    Pt was seen for mobility on RW with help to move a short walk, then to sequence stairs with respect to L knee weakness and pain.  Pt is in spasm with quads on LLE after moving on steps, and replaced ice and asked for pain meds in response.  Pt is recommended to go to home with HHPT after now having completed mobility ck and clearing her for orthostatics:  supine 121/62 p 62;  Sitting  116/66 p 73;  Standing 120/65 p 79.  Pt is appropriate for HHPT services at dc and will have family to assist her.  Follow goals for PT as outlined in plan of care.  Recommendations for follow up therapy are one component of a multi-disciplinary discharge planning process, led by the attending physician.  Recommendations may be updated based on patient status, additional functional criteria and insurance authorization.  Follow Up Recommendations  Follow physician's recommendations for discharge plan and follow up therapies     Assistance Recommended at Discharge Intermittent Supervision/Assistance  Patient can return home with the following A lot of help with bathing/dressing/bathroom;Assistance with cooking/housework;Help with stairs or ramp for entrance;Assist for transportation;A little help with walking and/or transfers   Equipment Recommendations  None recommended by PT    Recommendations for Other Services       Precautions / Restrictions Precautions Precautions: Knee Precaution Booklet Issued: No Precaution Comments: Reviewed knee precautions with pt. Required Braces or Orthoses:  (polar care) Restrictions Weight Bearing Restrictions: Yes LLE Weight Bearing: Weight bearing as tolerated     Mobility  Bed Mobility Overal bed mobility: Needs  Assistance Bed Mobility: Supine to Sit     Supine to sit: Min assist     General bed mobility comments: min assist to lift LLE and to maneuver trunk    Transfers Overall transfer level: Needs assistance Equipment used: Rolling walker (2 wheels) Transfers: Sit to/from Stand Sit to Stand: Min guard Stand pivot transfers: Min guard              Ambulation/Gait Ambulation/Gait assistance: Min guard Gait Distance (Feet): 8 Feet Assistive device: Rolling walker (2 wheels), 1 person hand held assist Gait Pattern/deviations: Step-through pattern, Step-to pattern, Decreased stride length, Decreased weight shift to left Gait velocity: reduced Gait velocity interpretation: <1.31 ft/sec, indicative of household ambulator Pre-gait activities: standing balance ck General Gait Details: careful loading on LLE, use of RW to support and protect L knee   Stairs Stairs: Yes Stairs assistance: Min guard Stair Management: One rail Right, One rail Left, Forwards, Step to pattern Number of Stairs: 6 (each direction) General stair comments: spasm in thigh with effort to do steps   Wheelchair Mobility    Modified Rankin (Stroke Patients Only)       Balance Overall balance assessment: Needs assistance Sitting-balance support: Feet supported Sitting balance-Leahy Scale: Good     Standing balance support: Bilateral upper extremity supported, During functional activity Standing balance-Leahy Scale: Fair Standing balance comment: less than fair dynamically                            Cognition Arousal/Alertness: Awake/alert Behavior During Therapy: WFL for tasks assessed/performed Overall Cognitive Status: Within Functional Limits  for tasks assessed                                          Exercises      General Comments General comments (skin integrity, edema, etc.): Pt demonstrates good tolerance to move with 70 deg flexion to set up to stand.  Pt  spasms on R thigh quads with stair effort      Pertinent Vitals/Pain Pain Assessment Pain Assessment: Faces Faces Pain Scale: Hurts little more Pain Location: L knee Pain Descriptors / Indicators: Guarding, Spasm Pain Intervention(s): Limited activity within patient's tolerance, Monitored during session, Premedicated before session, Repositioned, Ice applied    Home Living                          Prior Function            PT Goals (current goals can now be found in the care plan section) Acute Rehab PT Goals Patient Stated Goal: to get home and feel better Progress towards PT goals: Progressing toward goals    Frequency    7X/week      PT Plan Current plan remains appropriate    Co-evaluation              AM-PAC PT "6 Clicks" Mobility   Outcome Measure  Help needed turning from your back to your side while in a flat bed without using bedrails?: A Little Help needed moving from lying on your back to sitting on the side of a flat bed without using bedrails?: A Little Help needed moving to and from a bed to a chair (including a wheelchair)?: A Little Help needed standing up from a chair using your arms (e.g., wheelchair or bedside chair)?: A Little Help needed to walk in hospital room?: A Little Help needed climbing 3-5 steps with a railing? : A Little 6 Click Score: 18    End of Session Equipment Utilized During Treatment: Gait belt Activity Tolerance: Patient limited by pain Patient left: in chair;with call bell/phone within reach;with family/visitor present Nurse Communication: Mobility status PT Visit Diagnosis: Other abnormalities of gait and mobility (R26.89);Difficulty in walking, not elsewhere classified (R26.2);Pain Pain - Right/Left: Left Pain - part of body: Knee     Time: [id number] PT Time Calculation (min) (ACUTE ONLY): 32 min  Charges:  $Gait Training: 8-22 mins $Therapeutic Activity: 8-22 mins     Ramond Dial 05/23/2022, 3:02  PM  Mee Hives, PT PhD Acute Rehab Dept. Number: [place] and [place]

## 2022-05-23 NOTE — Progress Notes (Signed)
Physical Therapy Treatment Patient Details Name: Veronica Weber MRN: [id number] DOB: [birth year] Today's Date: 05/23/2022   History of Present Illness Pt is a 66 y/o female s/p L TKA. PMH includes HTN, CVA, and tobacco use.    PT Comments    Pt was seen for PM visit with information for dc conveyed to team, and pt agreeing to home with HHPT.  Pt is in attendance of daughter in PM who was able to observe pt get light headed despite not having been orthostatic.  Recommended to pt that her walks at home be short trips initially and to have chairs nearby.  Follow along with her for goals of acute PT.   Recommendations for follow up therapy are one component of a multi-disciplinary discharge planning process, led by the attending physician.  Recommendations may be updated based on patient status, additional functional criteria and insurance authorization.  Follow Up Recommendations  Follow physician's recommendations for discharge plan and follow up therapies     Assistance Recommended at Discharge Intermittent Supervision/Assistance  Patient can return home with the following A lot of help with bathing/dressing/bathroom;Assistance with cooking/housework;Help with stairs or ramp for entrance;Assist for transportation;A little help with walking and/or transfers   Equipment Recommendations  None recommended by PT    Recommendations for Other Services       Precautions / Restrictions Precautions Precautions: Knee Precaution Booklet Issued: No Precaution Comments: Reviewed knee precautions with pt. Required Braces or Orthoses:  (polar care) Restrictions Weight Bearing Restrictions: Yes LLE Weight Bearing: Weight bearing as tolerated     Mobility  Bed Mobility Overal bed mobility: Needs Assistance Bed Mobility: Supine to Sit     Supine to sit: Min assist     General bed mobility comments: up in chair when PT arrived    Transfers Overall transfer level: Needs  assistance Equipment used: Rolling walker (2 wheels) Transfers: Sit to/from Stand Sit to Stand: Min guard Stand pivot transfers: Min guard              Ambulation/Gait Ambulation/Gait assistance: Min guard Gait Distance (Feet): 60 Feet Assistive device: Rolling walker (2 wheels), 1 person hand held assist Gait Pattern/deviations: Step-through pattern, Step-to pattern, Decreased stride length, Decreased weight shift to left Gait velocity: reduced Gait velocity interpretation: <1.31 ft/sec, indicative of household ambulator Pre-gait activities: standing balance ck General Gait Details: careful loading on LLE, use of RW to support and protect L knee   Stairs Stairs: Yes Stairs assistance: Min guard Stair Management: One rail Right, One rail Left, Forwards, Step to pattern Number of Stairs: 6 (each direction) General stair comments: spasm in thigh with effort to do steps   Wheelchair Mobility    Modified Rankin (Stroke Patients Only)       Balance Overall balance assessment: Needs assistance Sitting-balance support: Feet supported Sitting balance-Leahy Scale: Good     Standing balance support: Bilateral upper extremity supported, During functional activity Standing balance-Leahy Scale: Fair Standing balance comment: less than fair dynamically                            Cognition Arousal/Alertness: Awake/alert Behavior During Therapy: WFL for tasks assessed/performed Overall Cognitive Status: Within Functional Limits for tasks assessed  Exercises      General Comments General comments (skin integrity, edema, etc.): Pt got light headed during walk and sat in a chair then brought her to sit in the recliner.  Pt was not orthostatic this AM but may need to be reassessed every visit      Pertinent Vitals/Pain Pain Assessment Pain Assessment: Faces Faces Pain Scale: Hurts little more Pain  Location: L knee Pain Descriptors / Indicators: Guarding, Spasm Pain Intervention(s): Monitored during session, Repositioned, Ice applied    Home Living                          Prior Function            PT Goals (current goals can now be found in the care plan section) Acute Rehab PT Goals Patient Stated Goal: to get home and feel better Progress towards PT goals: Progressing toward goals    Frequency    7X/week      PT Plan Current plan remains appropriate    Co-evaluation              AM-PAC PT "6 Clicks" Mobility   Outcome Measure  Help needed turning from your back to your side while in a flat bed without using bedrails?: A Little Help needed moving from lying on your back to sitting on the side of a flat bed without using bedrails?: A Little Help needed moving to and from a bed to a chair (including a wheelchair)?: A Little Help needed standing up from a chair using your arms (e.g., wheelchair or bedside chair)?: A Little Help needed to walk in hospital room?: A Little Help needed climbing 3-5 steps with a railing? : A Little 6 Click Score: 18    End of Session Equipment Utilized During Treatment: Gait belt Activity Tolerance: Patient limited by pain Patient left: in chair;with call bell/phone within reach;with family/visitor present Nurse Communication: Mobility status PT Visit Diagnosis: Other abnormalities of gait and mobility (R26.89);Difficulty in walking, not elsewhere classified (R26.2);Pain Pain - Right/Left: Left Pain - part of body: Knee     Time: 6712-4580 PT Time Calculation (min) (ACUTE ONLY): 18 min  Charges:  $Gait Training: 8-22 mins $Therapeutic Activity: 8-22 mins      Ramond Dial 05/23/2022, 3:10 PM  Mee Hives, PT PhD Acute Rehab Dept. Number: [place] and [place]

## 2022-05-23 NOTE — TOC Initial Note (Signed)
Transition of Care [facility]) - Initial/Assessment Note    Patient Details  Name: Veronica Weber MRN: [id number] Date of Birth: July 26, 1956  Transition of Care [facility]) CM/SW Contact:    Marilu Favre, RN Phone Number: 05/23/2022, 7:42 AM  Clinical Narrative:                 Patient from home alone.   Daughter in town and will stay with her until June 14, 2022. After that if she needs assistance  example transportation ,her mother is close by.   Patient already has CPM machine, walker and 3 in1 at home.   Dr Randel Pigg office has arranged HHPT with [facility]. Patient has already received phone call from [place]. NCM has confirmed with Claiborne Billings with [facility] they have accepted referral.     Transition of Care Department (TOC)  will continue to monitor patient advancement through interdisciplinary progression rounds. If new patient transition needs arise, please place a TOC consult.    Expected Discharge Plan: [place] Barriers to Discharge: Continued Medical Work up   Patient Goals and CMS Choice Patient states their goals for this hospitalization and ongoing recovery are:: to return to home CMS Medicare.gov Compare Post Acute Care list provided to:: Patient Choice offered to / list presented to : Patient  Expected Discharge Plan and Services Expected Discharge Plan: [place]   Discharge Planning Services: CM Consult Post Acute Care Choice: [place] arrangements for the past 2 months: Single Family Home Expected Discharge Date: 05/24/22               DME Arranged: N/A DME Agency: NA       HH Arranged: PT HH Agency: [place] Date [place]: 05/23/22 Time [place]: [phone] Representative spoke with at [place]: Claiborne Billings  Prior Living Arrangements/Services Living arrangements for the past 2 months: [place] with:: Self Patient language and need for interpreter  reviewed:: Yes Do you feel safe going back to the place where you live?: Yes      Need for Family Participation in Patient Care: Yes (Comment) Care giver support system in place?: Yes (comment) Current home services: DME Criminal Activity/Legal Involvement Pertinent to Current Situation/Hospitalization: No - Comment as needed  Activities of Daily Living      Permission Sought/Granted   Permission granted to share information with : No              Emotional Assessment Appearance:: Appears stated age Attitude/Demeanor/Rapport: Engaged Affect (typically observed): Accepting Orientation: : Oriented to Situation, Oriented to  Time, Oriented to Place, Oriented to Self Alcohol / Substance Use: Not Applicable Psych Involvement: No (comment)  Admission diagnosis:  OA (osteoarthritis) of knee [M17.9] Arthritis of knee [M17.10] Patient Active Problem List   Diagnosis Date Noted   OA (osteoarthritis) of knee 05/22/2022   Arthritis of knee 05/22/2022   Current every day smoker 01/19/2022   Difficulty sleeping 01/19/2022   Obesity, Class III, BMI 40-49.9 (morbid obesity) ([place]) 11/19/2021   Right middle cerebral artery stroke ([place]) 11/19/2021   Acute CVA (cerebrovascular accident) ([place]) 11/18/2021   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 [place] [address] Phone: [phone]  Fax: [phone]  [facility] ([facility]) - [place], [place] - [address] [place] [address] Phone: [phone] Fax: [phone]     Social Determinants of Health (SDOH) Interventions    Readmission Risk Interventions     No data to display

## 2022-05-23 NOTE — Progress Notes (Signed)
Orthopedic Tech Progress Note Patient Details:  Veronica Weber [birth year] [id number]  CPM Left Knee CPM Left Knee: On Left Knee Flexion (Degrees): 10 Left Knee Extension (Degrees): 45  Post Interventions Patient Tolerated: Well Instructions Provided: Care of device, Adjustment of device  [facility] 05/23/2022, 4:47 PM

## 2022-05-24 ENCOUNTER — Other Ambulatory Visit: Payer: Self-pay | Admitting: Physician Assistant

## 2022-05-24 DIAGNOSIS — M1712 Unilateral primary osteoarthritis, left knee: Secondary | ICD-10-CM | POA: Diagnosis not present

## 2022-05-24 LAB — CBC
HCT: 31.4 % — ABNORMAL LOW (ref 36.0–46.0)
Hemoglobin: 10.7 g/dL — ABNORMAL LOW (ref 12.0–15.0)
MCH: 32.5 pg (ref 26.0–34.0)
MCHC: 34.1 g/dL (ref 30.0–36.0)
MCV: 95.4 fL (ref 80.0–100.0)
Platelets: 233 10*3/uL (ref 150–400)
RBC: 3.29 MIL/uL — ABNORMAL LOW (ref 3.87–5.11)
RDW: 12.7 % (ref 11.5–15.5)
WBC: 8 10*3/uL (ref 4.0–10.5)
nRBC: 0 % (ref 0.0–0.2)

## 2022-05-24 MED ORDER — SODIUM CHLORIDE 0.9 % IV BOLUS
500.0000 mL | Freq: Once | INTRAVENOUS | Status: AC
  Administered 2022-05-24: 500 mL via INTRAVENOUS

## 2022-05-24 NOTE — Progress Notes (Addendum)
Physical Therapy Treatment Patient Details Name: Veronica Weber MRN: [id number] DOB: [birth year] Today's Date: 05/24/2022   History of Present Illness Pt is a 66 y/o female s/p L TKA. PMH includes HTN, CVA, and tobacco use.    PT Comments    Treatment session limited due to orthostatic hypotension and presyncope. Initiated session with pt performing bed level exercises for L knee strengthening and ROM. Once pt took a couple steps away from the bed, pt reporting dizziness, lightheadedness, and mild nausea with noted pallor. Returned to sitting edge of bed. BP measured 56/43 (49). RN present to assist PT with returning pt to supine. BP re-measured to be 107/63 (72). Will continue efforts.    Recommendations for follow up therapy are one component of a multi-disciplinary discharge planning process, led by the attending physician.  Recommendations may be updated based on patient status, additional functional criteria and insurance authorization.  Follow Up Recommendations  Follow physician's recommendations for discharge plan and follow up therapies     Assistance Recommended at Discharge Intermittent Supervision/Assistance  Patient can return home with the following A lot of help with bathing/dressing/bathroom;Assistance with cooking/housework;Help with stairs or ramp for entrance;Assist for transportation;A little help with walking and/or transfers   Equipment Recommendations  None recommended by PT    Recommendations for Other Services       Precautions / Restrictions Precautions Precautions: Knee Precaution Booklet Issued: No Precaution Comments: Orthostatic hypotension Required Braces or Orthoses:  (polar care) Restrictions Weight Bearing Restrictions: Yes LLE Weight Bearing: Weight bearing as tolerated     Mobility  Bed Mobility Overal bed mobility: Needs Assistance Bed Mobility: Supine to Sit, Sit to Supine     Supine to sit: Supervision Sit to supine: Mod  assist, +2 for physical assistance   General bed mobility comments: Pt did not need physical assist to exit towards left side of bed; use of gait belt to manage LLE. ModA + 2 for assist at BLE's and trunk to return to supine due to pre syncope    Transfers Overall transfer level: Needs assistance Equipment used: Rolling walker (2 wheels) Transfers: Sit to/from Stand Sit to Stand: Min guard           General transfer comment: Min guard for safety to rise from elevated surface    Ambulation/Gait Ambulation/Gait assistance: Min guard Gait Distance (Feet): 3 Feet Assistive device: Rolling walker (2 wheels) Gait Pattern/deviations: Step-to pattern, Decreased weight shift to left, Antalgic Gait velocity: decreased Gait velocity interpretation: <1.8 ft/sec, indicate of risk for recurrent falls   General Gait Details: Antalgic gait pattern, cues for sequencing/technique, moderate reliance through arms on walker   Stairs             Wheelchair Mobility    Modified Rankin (Stroke Patients Only)       Balance Overall balance assessment: Needs assistance Sitting-balance support: Feet supported Sitting balance-Leahy Scale: Good     Standing balance support: Bilateral upper extremity supported, During functional activity Standing balance-Leahy Scale: Fair                              Cognition Arousal/Alertness: Awake/alert Behavior During Therapy: WFL for tasks assessed/performed Overall Cognitive Status: Within Functional Limits for tasks assessed  Exercises Total Joint Exercises Quad Sets: Left, 5 reps, Supine Heel Slides: AAROM, Left, 5 reps, Supine Hip ABduction/ADduction: Left, AROM, 5 reps, Supine Straight Leg Raises: AAROM, Left, 5 reps, Supine    General Comments        Pertinent Vitals/Pain Pain Assessment Pain Assessment: Faces Faces Pain Scale: Hurts whole lot Pain Location:  L knee Pain Descriptors / Indicators: Guarding, Operative site guarding, Grimacing, Sore Pain Intervention(s): Limited activity within patient's tolerance, Monitored during session    Home Living                          Prior Function            PT Goals (current goals can now be found in the care plan section) Acute Rehab PT Goals Patient Stated Goal: to get home and feel better Potential to Achieve Goals: Good    Frequency    7X/week      PT Plan Current plan remains appropriate    Co-evaluation              AM-PAC PT "6 Clicks" Mobility   Outcome Measure  Help needed turning from your back to your side while in a flat bed without using bedrails?: A Little Help needed moving from lying on your back to sitting on the side of a flat bed without using bedrails?: A Little Help needed moving to and from a bed to a chair (including a wheelchair)?: A Little Help needed standing up from a chair using your arms (e.g., wheelchair or bedside chair)?: A Little Help needed to walk in hospital room?: A Little Help needed climbing 3-5 steps with a railing? : A Little 6 Click Score: 18    End of Session Equipment Utilized During Treatment: Gait belt Activity Tolerance: Other (comment) (orthostasis) Patient left: with call bell/phone within reach;in bed Nurse Communication: Mobility status;Other (comment) (vitals) PT Visit Diagnosis: Other abnormalities of gait and mobility (R26.89);Difficulty in walking, not elsewhere classified (R26.2);Pain Pain - Right/Left: Left Pain - part of body: Knee     Time: 2683-4196 PT Time Calculation (min) (ACUTE ONLY): 31 min  Charges:  $Therapeutic Exercise: 8-22 mins $Therapeutic Activity: 8-22 mins                     Wyona Almas, PT, DPT Acute Rehabilitation Services Office [phone]    Deno Etienne 05/24/2022, 9:14 AM

## 2022-05-24 NOTE — Progress Notes (Signed)
Orthopedic Tech Progress Note Patient Details:  Veronica Weber June 08, 1956 [id number] Patient placed on a 0-50 CPM  CPM Left Knee CPM Left Knee: On Left Knee Flexion (Degrees): 10 Left Knee Extension (Degrees): 45  Post Interventions Patient Tolerated: Well Instructions Provided: Care of device, Adjustment of device  Veronica Weber 05/24/2022, 8:04 AM

## 2022-05-24 NOTE — Plan of Care (Signed)
  Problem: Nutrition: Goal: Adequate nutrition will be maintained Outcome: Progressing   Problem: Pain Managment: Goal: General experience of comfort will improve Outcome: Progressing   Problem: Safety: Goal: Ability to remain free from injury will improve Outcome: Progressing   

## 2022-05-24 NOTE — Progress Notes (Addendum)
Physical Therapy Treatment Patient Details Name: Veronica Weber MRN: [id number] DOB: 29-Feb-1956 Today's Date: 05/24/2022   History of Present Illness Pt is a 66 y/o female s/p L TKA. PMH includes HTN, CVA, and tobacco use.    PT Comments    Pt with much improved activity tolerance and negative for orthostatic hypotension following fluid bolus. Highly encouraged continued hydration. Pt ambulating 100 ft with a walker, utilizing a step to pattern. Reviewed activity recommendations and HEP with pt and pt daughter. Ok for d/c home from PT perspective.   Orthostatic Vital Signs: Supine: 114/71 (85) Sitting: 116/62 (80) Standing: 116/65 (80)    Recommendations for follow up therapy are one component of a multi-disciplinary discharge planning process, led by the attending physician.  Recommendations may be updated based on patient status, additional functional criteria and insurance authorization.  Follow Up Recommendations  Follow physician's recommendations for discharge plan and follow up therapies     Assistance Recommended at Discharge Intermittent Supervision/Assistance  Patient can return home with the following A lot of help with bathing/dressing/bathroom;Assistance with cooking/housework;Help with stairs or ramp for entrance;Assist for transportation;A little help with walking and/or transfers   Equipment Recommendations  None recommended by PT    Recommendations for Other Services       Precautions / Restrictions Precautions Precautions: Knee Precaution Booklet Issued: No Precaution Comments: Orthostatic hypotension Required Braces or Orthoses:  (polar care) Restrictions Weight Bearing Restrictions: Yes LLE Weight Bearing: Weight bearing as tolerated     Mobility  Bed Mobility Overal bed mobility: Needs Assistance Bed Mobility: Supine to Sit     Supine to sit: Supervision Sit to supine: Mod assist, +2 for physical assistance   General bed mobility  comments: Use of gait belt to manuever LLE off edge of bed    Transfers Overall transfer level: Needs assistance Equipment used: Rolling walker (2 wheels) Transfers: Sit to/from Stand Sit to Stand: Min guard           General transfer comment: Min guard for safety to rise    Ambulation/Gait Ambulation/Gait assistance: Min guard Gait Distance (Feet): 100 Feet Assistive device: Rolling walker (2 wheels) Gait Pattern/deviations: Step-to pattern, Decreased weight shift to left, Antalgic Gait velocity: decreased Gait velocity interpretation: <1.8 ft/sec, indicate of risk for recurrent falls   General Gait Details: Cues for neutral L foot positioning, heel strike at initial contact, walker proximity. Moderate reliance through arms on walker   Stairs             Wheelchair Mobility    Modified Rankin (Stroke Patients Only)       Balance Overall balance assessment: Needs assistance Sitting-balance support: Feet supported Sitting balance-Leahy Scale: Good     Standing balance support: Bilateral upper extremity supported, During functional activity Standing balance-Leahy Scale: Fair                              Cognition Arousal/Alertness: Awake/alert Behavior During Therapy: WFL for tasks assessed/performed Overall Cognitive Status: Within Functional Limits for tasks assessed                                          Exercises Total Joint Exercises Quad Sets: Left, 5 reps, Supine Heel Slides: AAROM, Left, 5 reps, Seated Hip ABduction/ADduction: Left, AROM, 5 reps, Supine Straight Leg Raises: AAROM, Left, 5 reps,  Supine Goniometric ROM: Seated: 0-80 degrees    General Comments        Pertinent Vitals/Pain Pain Assessment Pain Assessment: Faces Faces Pain Scale: Hurts even more Pain Location: L knee Pain Descriptors / Indicators: Guarding, Operative site guarding, Grimacing, Sore Pain Intervention(s): Monitored during  session    Home Living                          Prior Function            PT Goals (current goals can now be found in the care plan section) Acute Rehab PT Goals Patient Stated Goal: to get home and feel better Potential to Achieve Goals: Good Progress towards PT goals: Progressing toward goals    Frequency    7X/week      PT Plan Current plan remains appropriate    Co-evaluation              AM-PAC PT "6 Clicks" Mobility   Outcome Measure  Help needed turning from your back to your side while in a flat bed without using bedrails?: None Help needed moving from lying on your back to sitting on the side of a flat bed without using bedrails?: A Little Help needed moving to and from a bed to a chair (including a wheelchair)?: A Little Help needed standing up from a chair using your arms (e.g., wheelchair or bedside chair)?: A Little Help needed to walk in hospital room?: A Little Help needed climbing 3-5 steps with a railing? : A Little 6 Click Score: 19    End of Session Equipment Utilized During Treatment: Gait belt Activity Tolerance: Patient tolerated treatment well Patient left: with call bell/phone within reach;in chair;with family/visitor present Nurse Communication: Mobility status PT Visit Diagnosis: Other abnormalities of gait and mobility (R26.89);Difficulty in walking, not elsewhere classified (R26.2);Pain Pain - Right/Left: Left Pain - part of body: Knee     Time: 2671-2458 PT Time Calculation (min) (ACUTE ONLY): 33 min  Charges:  $Gait Training: 8-22 mins $Therapeutic Activity: 8-22 mins                     Wyona Almas, PT, DPT Acute Rehabilitation Services Office [phone]    Deno Etienne 05/24/2022, 1:59 PM

## 2022-05-24 NOTE — Progress Notes (Signed)
  Subjective: Patient did well with therapy yesterday.  Pain slightly worse today as the blocks have worn off.  No nausea with hydrocodone   Objective: Vital signs in last 24 hours: Temp:  [97.8 F (36.6 C)-98.5 F (36.9 C)] 98.5 F (36.9 C) (09/23 0448) Pulse Rate:  [60-81] 81 (09/23 0448) Resp:  [16-19] 17 (09/23 0448) BP: (97-113)/(56-75) 113/57 (09/23 0448) SpO2:  [98 %-100 %] 98 % (09/23 0448)  Intake/Output from previous day: 09/22 0701 - 09/23 0700 In: 240 [P.O.:240] Out: -  Intake/Output this shift: No intake/output data recorded.  Exam:  Sensation intact distally Intact pulses distally Dorsiflexion/Plantar flexion intact  Labs: No results for input(s): "HGB" in the last 72 hours. No results for input(s): "WBC", "RBC", "HCT", "PLT" in the last 72 hours. No results for input(s): "NA", "K", "CL", "CO2", "BUN", "CREATININE", "GLUCOSE", "CALCIUM" in the last 72 hours. No results for input(s): "LABPT", "INR" in the last 72 hours.  Assessment/Plan: Plan is discharged to home.  Her daughter is coming to get her around 10:00.  CPM machine at home along with home health physical therapy.  Follow-up in 2 weeks.   Veronica Weber 05/24/2022, 7:35 AM

## 2022-05-24 NOTE — Progress Notes (Signed)
Physician Discharge Summary      Patient ID: Veronica Weber MRN: [id number] DOB/AGE: Jul 09, 1956 66 y.o.  Admit date: 05/22/2022 Discharge date: 05/24/2022  Admission Diagnoses:  Principal Problem:   OA (osteoarthritis) of knee Active Problems:   Arthritis of knee   Discharge Diagnoses:  Same  Surgeries: Procedure(s): LEFT TOTAL KNEE ARTHROPLASTY on 05/22/2022   Consultants:   Discharged Condition: Stable  Hospital Course: Veronica Weber is an 66 y.o. female who was admitted 05/22/2022 with a chief complaint of left knee pain, and found to have a diagnosis of OA (osteoarthritis) of knee.  They were brought to the operating room on 05/22/2022 and underwent the above named procedures.   Patient tolerated the procedure well.  Was able to mobilize well in the hall and on the stairs by postop day #2.  She is discharged home in good condition with aspirin for DVT prophylaxis hydrocodone for pain along with muscle relaxers and Zofran.  Home health physical therapy arranged.  Follow-up in 12 days.  Discharged to home in good condition with daughter at home.  Antibiotics given:  Anti-infectives (From admission, onward)    Start     Dose/Rate Route Frequency Ordered Stop   05/22/22 1400  ceFAZolin (ANCEF) IVPB 2g/100 mL premix        2 g 200 mL/hr over 30 Minutes Intravenous Every 6 hours 05/22/22 1215 05/22/22 2200   05/22/22 0627  vancomycin (VANCOCIN) powder  Status:  Discontinued          As needed 05/22/22 0627 05/22/22 1038   05/22/22 0600  ceFAZolin (ANCEF) IVPB 2g/100 mL premix        2 g 200 mL/hr over 30 Minutes Intravenous To ShortStay Surgical 05/22/22 0549 05/22/22 0813     .  Recent vital signs:  Vitals:   05/23/22 2109 05/24/22 0448  BP: (!) 104/56 (!) 113/57  Pulse: 77 81  Resp: 19 17  Temp: 98.5 F (36.9 C) 98.5 F (36.9 C)  SpO2: 99% 98%    Recent laboratory studies:  Results for orders placed or performed during the hospital encounter  of 05/15/22  Urine Culture   Specimen: Urine, Clean Catch  Result Value Ref Range   Specimen Description URINE, CLEAN CATCH    Special Requests      NONE Performed at [facility], [address], [place], [place] [address]    Culture MULTIPLE SPECIES PRESENT, SUGGEST RECOLLECTION (A)    Report Status 05/16/2022 FINAL   Surgical pcr screen   Specimen: Nasal Mucosa; Nasal Swab  Result Value Ref Range   MRSA, PCR NEGATIVE NEGATIVE   Staphylococcus aureus NEGATIVE NEGATIVE    Discharge Medications:   Allergies as of 05/24/2022   Not on File      Medication List     STOP taking these medications    aspirin EC 81 MG tablet Replaced by: aspirin 81 MG chewable tablet   lidocaine 5 % Commonly known as: LIDODERM   meloxicam 15 MG tablet Commonly known as: MOBIC   naltrexone 50 MG tablet Commonly known as: DEPADE       TAKE these medications    aspirin 81 MG chewable tablet Chew 1 tablet (81 mg total) by mouth 2 (two) times daily. Replaces: aspirin EC 81 MG tablet   atorvastatin 80 MG tablet Commonly known as: LIPITOR Take 1 tablet (80 mg total) by mouth daily.   chlorthalidone 25 MG tablet Commonly known as: HYGROTON Take 1 tablet (25 mg  total) by mouth daily. Take 1/2 tablet by mouth daily. What changed:  how much to take additional instructions   docusate sodium 100 MG capsule Commonly known as: COLACE Take 1 capsule (100 mg total) by mouth 2 (two) times daily.   gabapentin 300 MG capsule Commonly known as: Neurontin Take 1-2 pills nightly at bedtime What changed:  how much to take how to take this when to take this additional instructions   HYDROcodone-acetaminophen 5-325 MG tablet Commonly known as: NORCO/VICODIN Take 1-2 tablets by mouth every 4 (four) hours as needed for moderate pain.   losartan 50 MG tablet Commonly known as: COZAAR Take 1 tablet (50 mg total) by mouth daily.   magnesium gluconate 500 MG tablet Commonly known as:  MAGONATE Take 1 tablet (500 mg total) by mouth every evening.   methocarbamol 500 MG tablet Commonly known as: ROBAXIN Take 1 tablet (500 mg total) by mouth every 6 (six) hours as needed for muscle spasms.   multivitamin with minerals tablet Take 1 tablet by mouth daily.   naproxen 250 MG tablet Commonly known as: NAPROSYN Take 1 tablet (250 mg total) by mouth 2 (two) times daily with a meal.   nicotine 14 mg/24hr patch Commonly known as: NICODERM CQ - dosed in mg/24 hours Place 1 patch (14 mg total) onto the skin daily.   ondansetron 4 MG tablet Commonly known as: ZOFRAN Take 1 tablet (4 mg total) by mouth every 6 (six) hours as needed for nausea.        Diagnostic Studies: XR KNEE 3 VIEW LEFT  Result Date: 05/16/2022 AP, lateral, sunrise views of the left knee reviewed.  Moderate varus deformity noted with severe loss of medial joint space and subchondral sclerosis.  No fracture or dislocation.  Moderate degenerative changes in the lateral and patellofemoral compartments.  No abnormal patellar height.  DG Foot Complete Left  Result Date: 04/28/2022 Please see detailed radiograph report in office note.  DG Foot Complete Right  Result Date: 04/28/2022 Please see detailed radiograph report in office note.   Disposition: Discharge disposition: 01-Home or Self Care       Discharge Instructions     Call MD / Call 911   Complete by: As directed    If you experience chest pain or shortness of breath, CALL 911 and be transported to the hospital emergency room.  If you develope a fever above 101 F, pus (white drainage) or increased drainage or redness at the wound, or calf pain, call your surgeon's office.   Call MD / Call 911   Complete by: As directed    If you experience chest pain or shortness of breath, CALL 911 and be transported to the hospital emergency room.  If you develope a fever above 101 F, pus (white drainage) or increased drainage or redness at the wound,  or calf pain, call your surgeon's office.   Constipation Prevention   Complete by: As directed    Drink plenty of fluids.  Prune juice may be helpful.  You may use a stool softener, such as Colace (over the counter) 100 mg twice a day.  Use MiraLax (over the counter) for constipation as needed.   Constipation Prevention   Complete by: As directed    Drink plenty of fluids.  Prune juice may be helpful.  You may use a stool softener, such as Colace (over the counter) 100 mg twice a day.  Use MiraLax (over the counter) for constipation as needed.  Diet - low sodium heart healthy   Complete by: As directed    Diet - low sodium heart healthy   Complete by: As directed    Discharge instructions   Complete by: As directed    CPM machine 1 to 2 hours 3 times a day increasing the degrees daily Okay to shower dressing is waterproof Weightbearing as tolerated with walker   Increase activity slowly as tolerated   Complete by: As directed    Increase activity slowly as tolerated   Complete by: As directed    Post-operative opioid taper instructions:   Complete by: As directed    POST-OPERATIVE OPIOID TAPER INSTRUCTIONS: It is important to wean off of your opioid medication as soon as possible. If you do not need pain medication after your surgery it is ok to stop day one. Opioids include: Codeine, Hydrocodone(Norco, Vicodin), Oxycodone(Percocet, oxycontin) and hydromorphone amongst others.  Long term and even short term use of opiods can cause: Increased pain response Dependence Constipation Depression Respiratory depression And more.  Withdrawal symptoms can include Flu like symptoms Nausea, vomiting And more Techniques to manage these symptoms Hydrate well Eat regular healthy meals Stay active Use relaxation techniques(deep breathing, meditating, yoga) Do Not substitute Alcohol to help with tapering If you have been on opioids for less than two weeks and do not have pain than it is  ok to stop all together.  Plan to wean off of opioids This plan should start within one week post op of your joint replacement. Maintain the same interval or time between taking each dose and first decrease the dose.  Cut the total daily intake of opioids by one tablet each day Next start to increase the time between doses. The last dose that should be eliminated is the evening dose.      Post-operative opioid taper instructions:   Complete by: As directed    POST-OPERATIVE OPIOID TAPER INSTRUCTIONS: It is important to wean off of your opioid medication as soon as possible. If you do not need pain medication after your surgery it is ok to stop day one. Opioids include: Codeine, Hydrocodone(Norco, Vicodin), Oxycodone(Percocet, oxycontin) and hydromorphone amongst others.  Long term and even short term use of opiods can cause: Increased pain response Dependence Constipation Depression Respiratory depression And more.  Withdrawal symptoms can include Flu like symptoms Nausea, vomiting And more Techniques to manage these symptoms Hydrate well Eat regular healthy meals Stay active Use relaxation techniques(deep breathing, meditating, yoga) Do Not substitute Alcohol to help with tapering If you have been on opioids for less than two weeks and do not have pain than it is ok to stop all together.  Plan to wean off of opioids This plan should start within one week post op of your joint replacement. Maintain the same interval or time between taking each dose and first decrease the dose.  Cut the total daily intake of opioids by one tablet each day Next start to increase the time between doses. The last dose that should be eliminated is the evening dose.           Follow-up Information     [facility] [place] Follow up.   Specialty: [facility] Contact information: [address] [place] [address] [phone]                  Signed: Anderson Malta 05/24/2022, 7:38 AM

## 2022-05-25 ENCOUNTER — Other Ambulatory Visit: Payer: Self-pay | Admitting: Orthopedic Surgery

## 2022-05-25 ENCOUNTER — Other Ambulatory Visit (HOSPITAL_BASED_OUTPATIENT_CLINIC_OR_DEPARTMENT_OTHER): Payer: Self-pay | Admitting: Orthopaedic Surgery

## 2022-05-25 MED ORDER — ASPIRIN 81 MG PO CHEW: 81.0000 mg | CHEWABLE_TABLET | Freq: Two times a day (BID) | ORAL | 0 refills | Status: DC

## 2022-05-25 MED ORDER — ONDANSETRON HCL 4 MG PO TABS: 4.0000 mg | ORAL_TABLET | Freq: Four times a day (QID) | ORAL | 0 refills | Status: DC | PRN

## 2022-05-25 MED ORDER — DOCUSATE SODIUM 100 MG PO CAPS: 100.0000 mg | ORAL_CAPSULE | Freq: Two times a day (BID) | ORAL | 0 refills | Status: DC

## 2022-05-25 MED ORDER — METHOCARBAMOL 500 MG PO TABS: 500.0000 mg | ORAL_TABLET | Freq: Four times a day (QID) | ORAL | 0 refills | Status: DC | PRN

## 2022-05-25 MED ORDER — HYDROCODONE-ACETAMINOPHEN 5-325 MG PO TABS: 1.0000 | ORAL_TABLET | ORAL | 0 refills | Status: DC | PRN

## 2022-05-25 MED ORDER — NAPROXEN 250 MG PO TABS: 250.0000 mg | ORAL_TABLET | Freq: Two times a day (BID) | ORAL | 0 refills | Status: DC

## 2022-05-26 ENCOUNTER — Encounter: Payer: Self-pay | Admitting: *Deleted

## 2022-06-05 NOTE — Discharge Summary (Signed)
Physician Discharge Summary      Patient ID: Veronica Weber MRN: [id number] DOB/AGE: Nov 20, 1955 66 y.o.  Admit date: 05/22/2022 Discharge date: 05/24/2022  Admission Diagnoses:  Principal Problem:   OA (osteoarthritis) of knee Active Problems:   Arthritis of knee   Discharge Diagnoses:  Same  Surgeries: Procedure(s): LEFT TOTAL KNEE ARTHROPLASTY on 05/22/2022   Consultants:   Discharged Condition: Stable  Hospital Course: Veronica Weber is an 66 y.o. female who was admitted 05/22/2022 with a chief complaint of left knee pain, and found to have a diagnosis of OA (osteoarthritis) of knee.  They were brought to the operating room on 05/22/2022 and underwent the above named procedures.  Patient tolerated the procedure well.  She mobilized with physical therapy and was functional with activities of daily living with walker and assist.  She is discharged in good condition.  She will follow-up with [place] in 12 days.  Pain medicine DVT prophylaxis muscle relaxers prescribed.  Antibiotics given:  Anti-infectives (From admission, onward)    Start     Dose/Rate Route Frequency Ordered Stop   05/22/22 1400  ceFAZolin (ANCEF) IVPB 2g/100 mL premix        2 g 200 mL/hr over 30 Minutes Intravenous Every 6 hours 05/22/22 1215 05/22/22 2200   05/22/22 0627  vancomycin (VANCOCIN) powder  Status:  Discontinued          As needed 05/22/22 0627 05/22/22 1038   05/22/22 0600  ceFAZolin (ANCEF) IVPB 2g/100 mL premix        2 g 200 mL/hr over 30 Minutes Intravenous To ShortStay Surgical 05/22/22 0549 05/22/22 0813     .  Recent vital signs:  Vitals:   05/24/22 1327 05/24/22 1658  BP: 114/71 128/63  Pulse:  87  Resp:  16  Temp:  98.2 F (36.8 C)  SpO2:  99%    Recent laboratory studies:  Results for orders placed or performed during the hospital encounter of 05/22/22  CBC  Result Value Ref Range   WBC 8.0 4.0 - 10.5 K/uL   RBC 3.29 (L) 3.87 - 5.11 MIL/uL   Hemoglobin  10.7 (L) 12.0 - 15.0 g/dL   HCT 31.4 (L) 36.0 - 46.0 %   MCV 95.4 80.0 - 100.0 fL   MCH 32.5 26.0 - 34.0 pg   MCHC 34.1 30.0 - 36.0 g/dL   RDW 12.7 11.5 - 15.5 %   Platelets 233 150 - 400 K/uL   nRBC 0.0 0.0 - 0.2 %    Discharge Medications:   Allergies as of 05/24/2022   Not on File      Medication List     STOP taking these medications    aspirin EC 81 MG tablet   lidocaine 5 % Commonly known as: LIDODERM   meloxicam 15 MG tablet Commonly known as: MOBIC   naltrexone 50 MG tablet Commonly known as: DEPADE       TAKE these medications    atorvastatin 80 MG tablet Commonly known as: LIPITOR Take 1 tablet (80 mg total) by mouth daily.   chlorthalidone 25 MG tablet Commonly known as: HYGROTON Take 1 tablet (25 mg total) by mouth daily. Take 1/2 tablet by mouth daily. What changed:  how much to take additional instructions   losartan 50 MG tablet Commonly known as: COZAAR Take 1 tablet (50 mg total) by mouth daily.   multivitamin with minerals tablet Take 1 tablet by mouth daily.   nicotine 14 mg/24hr patch Commonly known  as: NICODERM CQ - dosed in mg/24 hours Place 1 patch (14 mg total) onto the skin daily.       ASK your doctor about these medications    magnesium gluconate 500 MG tablet Commonly known as: MAGONATE Take 1 tablet (500 mg total) by mouth every evening. Ask about: Should I take this medication?        Diagnostic Studies: XR KNEE 3 VIEW LEFT  Result Date: 05/16/2022 AP, lateral, sunrise views of the left knee reviewed.  Moderate varus deformity noted with severe loss of medial joint space and subchondral sclerosis.  No fracture or dislocation.  Moderate degenerative changes in the lateral and patellofemoral compartments.  No abnormal patellar height.   Disposition: Discharge disposition: 01-Home or Self Care       Discharge Instructions     Call MD / Call 911   Complete by: As directed    If you experience chest pain  or shortness of breath, CALL 911 and be transported to the hospital emergency room.  If you develope a fever above 101 F, pus (white drainage) or increased drainage or redness at the wound, or calf pain, call your surgeon's office.   Call MD / Call 911   Complete by: As directed    If you experience chest pain or shortness of breath, CALL 911 and be transported to the hospital emergency room.  If you develope a fever above 101 F, pus (white drainage) or increased drainage or redness at the wound, or calf pain, call your surgeon's office.   Constipation Prevention   Complete by: As directed    Drink plenty of fluids.  Prune juice may be helpful.  You may use a stool softener, such as Colace (over the counter) 100 mg twice a day.  Use MiraLax (over the counter) for constipation as needed.   Constipation Prevention   Complete by: As directed    Drink plenty of fluids.  Prune juice may be helpful.  You may use a stool softener, such as Colace (over the counter) 100 mg twice a day.  Use MiraLax (over the counter) for constipation as needed.   Diet - low sodium heart healthy   Complete by: As directed    Diet - low sodium heart healthy   Complete by: As directed    Discharge instructions   Complete by: As directed    CPM machine 1 to 2 hours 3 times a day increasing the degrees daily Okay to shower dressing is waterproof Weightbearing as tolerated with walker   Increase activity slowly as tolerated   Complete by: As directed    Increase activity slowly as tolerated   Complete by: As directed    Post-operative opioid taper instructions:   Complete by: As directed    POST-OPERATIVE OPIOID TAPER INSTRUCTIONS: It is important to wean off of your opioid medication as soon as possible. If you do not need pain medication after your surgery it is ok to stop day one. Opioids include: Codeine, Hydrocodone(Norco, Vicodin), Oxycodone(Percocet, oxycontin) and hydromorphone amongst others.  Long term and  even short term use of opiods can cause: Increased pain response Dependence Constipation Depression Respiratory depression And more.  Withdrawal symptoms can include Flu like symptoms Nausea, vomiting And more Techniques to manage these symptoms Hydrate well Eat regular healthy meals Stay active Use relaxation techniques(deep breathing, meditating, yoga) Do Not substitute Alcohol to help with tapering If you have been on opioids for less than two weeks and do not  have pain than it is ok to stop all together.  Plan to wean off of opioids This plan should start within one week post op of your joint replacement. Maintain the same interval or time between taking each dose and first decrease the dose.  Cut the total daily intake of opioids by one tablet each day Next start to increase the time between doses. The last dose that should be eliminated is the evening dose.      Post-operative opioid taper instructions:   Complete by: As directed    POST-OPERATIVE OPIOID TAPER INSTRUCTIONS: It is important to wean off of your opioid medication as soon as possible. If you do not need pain medication after your surgery it is ok to stop day one. Opioids include: Codeine, Hydrocodone(Norco, Vicodin), Oxycodone(Percocet, oxycontin) and hydromorphone amongst others.  Long term and even short term use of opiods can cause: Increased pain response Dependence Constipation Depression Respiratory depression And more.  Withdrawal symptoms can include Flu like symptoms Nausea, vomiting And more Techniques to manage these symptoms Hydrate well Eat regular healthy meals Stay active Use relaxation techniques(deep breathing, meditating, yoga) Do Not substitute Alcohol to help with tapering If you have been on opioids for less than two weeks and do not have pain than it is ok to stop all together.  Plan to wean off of opioids This plan should start within one week post op of your joint  replacement. Maintain the same interval or time between taking each dose and first decrease the dose.  Cut the total daily intake of opioids by one tablet each day Next start to increase the time between doses. The last dose that should be eliminated is the evening dose.           Follow-up Information     [facility] [place] Follow up.   Specialty: [facility] Contact information: [place] [address] [phone]                  Signed: Anderson Malta 06/05/2022, 4:52 PM

## 2022-06-06 ENCOUNTER — Ambulatory Visit (INDEPENDENT_AMBULATORY_CARE_PROVIDER_SITE_OTHER): Payer: Medicare Other | Admitting: Orthopedic Surgery

## 2022-06-06 ENCOUNTER — Encounter: Payer: Self-pay | Admitting: Orthopedic Surgery

## 2022-06-06 ENCOUNTER — Ambulatory Visit (INDEPENDENT_AMBULATORY_CARE_PROVIDER_SITE_OTHER): Payer: Medicare Other

## 2022-06-06 DIAGNOSIS — Z96652 Presence of left artificial knee joint: Secondary | ICD-10-CM

## 2022-06-06 NOTE — Progress Notes (Signed)
Post-Op Visit Note   Patient: Veronica Weber           Date of Birth: 04-21-1956           MRN: [id number] Visit Date: 06/06/2022 PCP: Bary Leriche, PA-C   Assessment & Plan:  Chief Complaint:  Chief Complaint  Patient presents with   Left Knee - Routine Post Op    05/22/22 left TKA   Visit Diagnoses:  1. S/P total knee arthroplasty, left     Plan: Veronica Weber is a 66 year old patient who is now 2 weeks out left total knee replacement.  She is 120 on CPM machine.  Therapy note is reviewed from home health shows range of motion 0-1 12.  Her daughter has 8 more days to stay with her.  Using a walker and occasionally a cane.  On examination the incision is intact.  Range of motion is fairly easily past 90 with good extension.  Incision intact.  No calf tenderness negative Homans.  Radiographs look good in terms of knee replacement.  Plan is to start outpatient physical therapy here 2 times a week for 6 weeks plus a home exercise program with handicap sticker for 6 months and 4-week return for clinical recheck.  Follow-Up Instructions: Return in about 4 weeks (around 07/04/2022).   Orders:  Orders Placed This Encounter  Procedures   XR Knee 1-2 Views Left   Ambulatory referral to Physical Therapy   No orders of the defined types were placed in this encounter.   Imaging: No results found.  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   Essential hypertension 11/16/2021   Hyperlipidemia 11/16/2021   Tobacco use 11/16/2021   Syncope and collapse 06/26/2021   Past Medical History:   Diagnosis Date   Abortion    x3   Arthritis    Cataract    Emphysema/COPD (HCC)    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   Hyperlipidemia    Hypertension    Thyroid disease    Tobacco use     Family History  Problem Relation Age of Onset   Arthritis Mother    Cancer Maternal Grandmother    Diabetes Maternal Grandmother    Diabetes Maternal Grandfather    Healthy Daughter    Healthy Son     Past Surgical History:  Procedure Laterality Date   APPENDECTOMY  1976   CATARACT EXTRACTION     1997, 1998   CESAREAN SECTION  1983   CHOLECYSTECTOMY  1998   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   Procedure: LEFT TOTAL KNEE ARTHROPLASTY;  Surgeon: [name], MD;  Location: [facility] OR;  Service: Orthopedics;  Laterality: Left;   TUBAL LIGATION  1994   Social History   Occupational History   Not on file  Tobacco Use   Smoking status: Every Day    Packs/day: 0.25    Years: 45.00    Total pack years: 11.25    Types: Cigarettes    Start date: 06/01/1976   Smokeless tobacco: Never  Vaping Use   Vaping Use: Never used  Substance and Sexual Activity   Alcohol  use: Not Currently   Drug use: Never   Sexual activity: Not Currently

## 2022-06-13 ENCOUNTER — Telehealth: Payer: Self-pay | Admitting: *Deleted

## 2022-06-13 NOTE — Telephone Encounter (Signed)
Received a call from [facility] stating they went to see patient today and she reports getting up and feeling a "tweak" in the knee; having a lot more pain and now can barely get ROM to 90 degrees. She was well past this. Supposed to start OPPT here on Tuesday. Any recommendations? Thanks.

## 2022-06-16 LAB — HM MAMMOGRAPHY

## 2022-06-17 ENCOUNTER — Ambulatory Visit: Payer: Medicare Other | Admitting: Physical Therapy

## 2022-06-17 ENCOUNTER — Other Ambulatory Visit: Payer: Self-pay

## 2022-06-17 DIAGNOSIS — M6281 Muscle weakness (generalized): Secondary | ICD-10-CM

## 2022-06-17 DIAGNOSIS — R600 Localized edema: Secondary | ICD-10-CM

## 2022-06-17 DIAGNOSIS — M25562 Pain in left knee: Secondary | ICD-10-CM

## 2022-06-17 DIAGNOSIS — R2689 Other abnormalities of gait and mobility: Secondary | ICD-10-CM

## 2022-06-17 NOTE — Therapy (Signed)
OUTPATIENT PHYSICAL THERAPY LOWER EXTREMITY EVALUATION   Patient Name: Veronica Weber MRN: [id number] DOB:1956-06-22, 66 y.o., female Today's Date: 06/17/2022   PT End of Session - 06/17/22 1419     Visit Number 1    Number of Visits 12    Date for PT Re-Evaluation 07/29/22    Authorization Type UHC MCR    PT Start Time 6568    PT Stop Time 1420    PT Time Calculation (min) 35 min    Activity Tolerance Patient tolerated treatment well             Past Medical History:  Diagnosis Date   Abortion    x3   Arthritis    Cataract    Emphysema/COPD ([place])    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   Hyperlipidemia    Hypertension    Thyroid disease    Tobacco use    Past Surgical History:  Procedure Laterality Date   [place]   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   Procedure: LEFT TOTAL KNEE ARTHROPLASTY;  Surgeon: Meredith Pel, MD;  Location: [facility];  Service: Orthopedics;  Laterality: Left;   TUBAL LIGATION  1994   Patient Active Problem List   Diagnosis Date Noted   OA (osteoarthritis) of knee 05/22/2022   Arthritis of knee 05/22/2022   Current every day smoker 01/19/2022   Difficulty sleeping 01/19/2022   Obesity, Class III, BMI 40-49.9 (morbid obesity) (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   Acute CVA (cerebrovascular accident) ([place]) 11/18/2021   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   Essential hypertension 11/16/2021   Hyperlipidemia 11/16/2021   Tobacco use 11/16/2021   Syncope and collapse 06/26/2021    LEX:[id number], Randa Evens, PA-C  REFERRING PROVIDER: Meredith Pel, MD  REFERRING DIAG: [id number] (ICD-10-CM) - S/P total knee arthroplasty, left 05/22/22  THERAPY DIAG:  Acute pain of left knee  Muscle weakness (generalized)  Other abnormalities of gait and mobility  Localized edema  Rationale for  Evaluation and Treatment Rehabilitation  ONSET DATE: 05/22/22  SUBJECTIVE:   SUBJECTIVE STATEMENT: She had Lt TKA. She says she was doing great after PT then twisted her knee last Thursday and has more pain and stiffness since.   PERTINENT HISTORY:PMH Lt TKA CVA,HTN, HLD, Hypothyroidism, vasovagal syncope, obesity   PAIN:  Are you having pain? Yes: NPRS scale: 4/10 Pain location: left lateral knee Pain description: ache Aggravating factors: bending her knee back Relieving factors: stretching the knee out  PRECAUTIONS: None  WEIGHT BEARING RESTRICTIONS No  FALLS:  Has patient fallen in last 6 months? 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  PLOF: Independent  PATIENT GOALS reduce pain, return to work as Scientist, water quality   OBJECTIVE:   DIAGNOSTIC FINDINGS:   PATIENT SURVEYS:  Eval: FOTO 52% functional  COGNITION:  Overall cognitive status: Within functional limits for tasks assessed     SENSATION: WFL  EDEMA:  Moderate in left knee at eval  MUSCLE LENGTH:   POSTURE:   PALPATION:   LOWER EXTREMITY ROM:   AROM/PROM Right eval Left eval  Hip flexion    Hip extension    Hip abduction    Hip adduction    Hip internal rotation    Hip external rotation    Knee flexion  93/105  Knee extension  2/0  Ankle dorsiflexion  Ankle plantarflexion    Ankle inversion    Ankle eversion     (Blank rows = not tested)  LOWER EXTREMITY MMT:  MMT Right eval Left eval  Hip flexion  3+  Hip extension    Hip abduction  3+  Hip adduction    Hip internal rotation    Hip external rotation    Knee flexion  4  Knee extension  4  Ankle dorsiflexion    Ankle plantarflexion    Ankle inversion    Ankle eversion     (Blank rows = not tested)  LOWER EXTREMITY SPECIAL TESTS:    FUNCTIONAL TESTS:  5 times sit to stand: 12.93 seconds, uses UE to push up from lap  GAIT: Eval Distance walked: 75 Assistive device utilized: RW Level of assistance: Modified  independence Comments: able to walk 15 feet without AD with close supervision, does not use AD PLOF   TODAY'S TREATMENT:  Eval HEP creation and review with one trial set completed, see below for details Nu step X 6 min UE/LE L5   PATIENT EDUCATION: Education details: HEP, PT plan of care Person educated: Patient Education method: Explanation, Demonstration, Verbal cues, and Handouts Education comprehension: verbalized understanding and needs further education   HOME EXERCISE PROGRAM: Access Code: [email or web address] URL: [email or web address] Date: 06/17/2022 Prepared by: Ivery Quale  Exercises - Seated Straight Leg Raise with Quad Contraction  - 2 x daily - 6 x weekly - 2 sets - 10 reps - Sit to Stand Without Arm Support  - 2 x daily - 6 x weekly - 1 sets - 10 reps - Seated Knee Flexion Stretch  - 2 x daily - 6 x weekly - 1-2 sets - 10 reps - 5 sec hold  ASSESSMENT:  CLINICAL IMPRESSION: Patient referred to PT  S/P total knee arthroplasty, left 05/22/22. Overall doing fairly well up to this point. Patient will benefit from skilled PT to address below impairments, limitations and improve overall function.  OBJECTIVE IMPAIRMENTS: decreased activity tolerance, difficulty walking, decreased balance, decreased endurance, decreased mobility, decreased ROM, decreased strength, impaired flexibility, impaired LE use, postural dysfunction, and pain.  ACTIVITY LIMITATIONS: bending, lifting, carry, locomotion, cleaning, community activity, driving, and or occupation  PERSONAL FACTORS:  PMH Lt TKA CVA,HTN, HLD, Hypothyroidism, vasovagal syncope, obesityare also affecting patient's functional outcome.  REHAB POTENTIAL: Good  CLINICAL DECISION MAKING: Stable/uncomplicated  EVALUATION COMPLEXITY: Low    GOALS: Short term PT Goals Target date: 07/15/2022 Pt will be I and compliant with HEP. Baseline:  Goal status: New Pt will decrease pain by 25% overall Baseline: Goal status:  New  Long term PT goals Target date: 07/29/2022 Pt will improve Lt knee AROM 0-110 to improve functional mobility Baseline: Goal status: New Pt will improve  hip/knee strength to at least 5-/5 MMT to improve functional strength Baseline: Goal status: New Pt will improve FOTO to at least 59% functional to show improved function Baseline: Goal status: New Pt will reduce pain to overall less than 2-3/10 with usual activity and work activity. Baseline: Goal status: New PLAN: PT FREQUENCY: 2 times per week   PT DURATION: 6 weeks  PLANNED INTERVENTIONS (unless contraindicated): aquatic PT, Canalith repositioning, cryotherapy, Electrical stimulation, Iontophoresis with 4 mg/ml dexamethasome, Moist heat, traction, Ultrasound, gait training, Therapeutic exercise, balance training, neuromuscular re-education, patient/family education, prosthetic training, manual techniques, passive ROM, dry needling, taping, vasopnuematic device, vestibular, spinal manipulations, joint manipulations  PLAN FOR NEXT SESSION: Left knee ROM and strength as tolerated, gait with LRAD  Debbe Odea, PT,DPT 06/17/2022, 2:20 PM

## 2022-06-18 ENCOUNTER — Encounter: Payer: Self-pay | Admitting: Physical Therapy

## 2022-06-18 ENCOUNTER — Ambulatory Visit: Payer: Medicare Other | Admitting: Physical Therapy

## 2022-06-18 DIAGNOSIS — R600 Localized edema: Secondary | ICD-10-CM | POA: Diagnosis not present

## 2022-06-18 DIAGNOSIS — M6281 Muscle weakness (generalized): Secondary | ICD-10-CM

## 2022-06-18 DIAGNOSIS — M25562 Pain in left knee: Secondary | ICD-10-CM | POA: Diagnosis not present

## 2022-06-18 DIAGNOSIS — R2689 Other abnormalities of gait and mobility: Secondary | ICD-10-CM

## 2022-06-18 NOTE — Therapy (Signed)
OUTPATIENT PHYSICAL THERAPY TREATMENT NOTE   Patient Name: Veronica Weber MRN: [id number] DOB:05/03/1956, 66 y.o., female Today's Date: 06/18/2022  END OF SESSION:   PT End of Session - 06/18/22 1428     Visit Number 2    Number of Visits 12    Date for PT Re-Evaluation 07/29/22    Authorization Type UHC MCR    PT Start Time [id number]    PT Stop Time 1422    PT Time Calculation (min) 38 min    Activity Tolerance Patient tolerated treatment well             Past Medical History:  Diagnosis Date   Abortion    x3   Arthritis    Cataract    Emphysema/COPD ([place])    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   Hyperlipidemia    Hypertension    Thyroid disease    Tobacco use    Past Surgical History:  Procedure Laterality Date   [place]   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   Procedure: LEFT TOTAL KNEE ARTHROPLASTY;  Surgeon: Meredith Pel, MD;  Location: [facility];  Service: Orthopedics;  Laterality: Left;   TUBAL LIGATION  1994   Patient Active Problem List   Diagnosis Date Noted   OA (osteoarthritis) of knee 05/22/2022   Arthritis of knee 05/22/2022   Current every day smoker 01/19/2022   Difficulty sleeping 01/19/2022   Obesity, Class III, BMI 40-49.9 (morbid obesity) ([place]) 11/19/2021   Right middle cerebral artery stroke ([place]) 11/19/2021   Acute CVA (cerebrovascular accident) ([place]) 11/18/2021   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   Essential hypertension 11/16/2021   Hyperlipidemia 11/16/2021   Tobacco use 11/16/2021   Syncope and collapse 06/26/2021      THERAPY DIAG:  Acute pain of left knee  Muscle weakness (generalized)  Other abnormalities of gait and mobility  Localized edema    [id number], Randa Evens, PA-C   REFERRING PROVIDER: Meredith Pel, MD   REFERRING DIAG: [phone] (ICD-10-CM) - S/P total knee arthroplasty, left 05/22/22    Rationale for Evaluation and Treatment Rehabilitation   ONSET DATE: 05/22/22   SUBJECTIVE:    SUBJECTIVE STATEMENT: She says pain is about 5/10 in her knee   PERTINENT HISTORY:PMH Lt TKA CVA,HTN, HLD, Hypothyroidism, vasovagal syncope, obesity     PAIN:  Are you having pain? Yes: NPRS scale: 5/10 Pain location: left lateral knee Pain description: ache Aggravating factors: bending her knee back Relieving factors: stretching the knee out   PRECAUTIONS: None   WEIGHT BEARING RESTRICTIONS No   FALLS:  Has patient fallen in last 6 months? No   OCCUPATION: Cabin crew   PLOF: Independent   PATIENT GOALS reduce pain, return to work as Scientist, water quality     OBJECTIVE:    DIAGNOSTIC FINDINGS:    PATIENT SURVEYS:  Eval: FOTO 52% functional   COGNITION:           Overall cognitive status: Within functional limits for tasks assessed                          SENSATION: WFL   EDEMA:  Moderate in left knee at eval   MUSCLE LENGTH:     POSTURE:    PALPATION:     LOWER EXTREMITY ROM:    AROM/PROM Right eval Left  eval  Hip flexion      Hip extension      Hip abduction      Hip adduction      Hip internal rotation      Hip external rotation      Knee flexion   93/105  Knee extension   2/0  Ankle dorsiflexion      Ankle plantarflexion      Ankle inversion      Ankle eversion       (Blank rows = not tested)   LOWER EXTREMITY MMT:   MMT Right eval Left eval  Hip flexion   3+  Hip extension      Hip abduction   3+  Hip adduction      Hip internal rotation      Hip external rotation      Knee flexion   4  Knee extension   4  Ankle dorsiflexion      Ankle plantarflexion      Ankle inversion      Ankle eversion       (Blank rows = not tested)   LOWER EXTREMITY SPECIAL TESTS:      FUNCTIONAL TESTS:  5 times sit to stand: 12.93 seconds, uses UE to push up from lap   GAIT: Eval Distance walked: 75 Assistive device utilized: RW Level of  assistance: Modified independence Comments: able to walk 15 feet without AD with close supervision, does not use AD PLOF     TODAY'S TREATMENT:  06/18/22 Nu step seat # 4 for 74min UE/LE L5 Gait 150 feet with SPC with supervision Sit to stands from bar stool X 10 Seated knee flexion stretch 10 sec X 10 Seated LAQ 2# 2X10 on Lt Standing H.S curl 2# 2X10 Standing hip abd 2# 2X10   Lt knee PROM flexion and extension with overpressure to tolerance      PATIENT EDUCATION: Education details: HEP, PT plan of care Person educated: Patient Education method: Explanation, Demonstration, Verbal cues, and Handouts Education comprehension: verbalized understanding and needs further education     HOME EXERCISE PROGRAM: Access Code: [email or web address] URL: [email or web address] Date: 06/17/2022 Prepared by: Elsie Ra   Exercises - Seated Straight Leg Raise with Quad Contraction  - 2 x daily - 6 x weekly - 2 sets - 10 reps - Sit to Stand Without Arm Support  - 2 x daily - 6 x weekly - 1 sets - 10 reps - Seated Knee Flexion Stretch  - 2 x daily - 6 x weekly - 1-2 sets - 10 reps - 5 sec hold   ASSESSMENT:   CLINICAL IMPRESSION: She arrives with SPC vs RW today and is overall supervision for this. She shows good understanding of HEP. Worked to improve knee ROM and strength today as tolerated.    OBJECTIVE IMPAIRMENTS: decreased activity tolerance, difficulty walking, decreased balance, decreased endurance, decreased mobility, decreased ROM, decreased strength, impaired flexibility, impaired LE use, postural dysfunction, and pain.   ACTIVITY LIMITATIONS: bending, lifting, carry, locomotion, cleaning, community activity, driving, and or occupation   PERSONAL FACTORS:  PMH Lt TKA CVA,HTN, HLD, Hypothyroidism, vasovagal syncope, obesityare also affecting patient's functional outcome.   REHAB POTENTIAL: Good   CLINICAL DECISION MAKING: Stable/uncomplicated   EVALUATION COMPLEXITY:  Low       GOALS: Short term PT Goals Target date: 07/15/2022 Pt will be I and compliant with HEP. Baseline:  Goal status: New Pt will decrease pain by 25% overall Baseline: Goal status: New  Long term PT goals Target date: 07/29/2022 Pt will improve Lt knee AROM 0-110 to improve functional mobility Baseline: Goal status: New Pt will improve  hip/knee strength to at least 5-/5 MMT to improve functional strength Baseline: Goal status: New Pt will improve FOTO to at least 59% functional to show improved function Baseline: Goal status: New Pt will reduce pain to overall less than 2-3/10 with usual activity and work activity. Baseline: Goal status: New PLAN: PT FREQUENCY: 2 times per week    PT DURATION: 6 weeks   PLANNED INTERVENTIONS (unless contraindicated): aquatic PT, Canalith repositioning, cryotherapy, Electrical stimulation, Iontophoresis with 4 mg/ml dexamethasome, Moist heat, traction, Ultrasound, gait training, Therapeutic exercise, balance training, neuromuscular re-education, patient/family education, prosthetic training, manual techniques, passive ROM, dry needling, taping, vasopnuematic device, vestibular, spinal manipulations, joint manipulations   PLAN FOR NEXT SESSION: Left knee ROM and strength as tolerated, gait with [place], PT,DPT 06/18/2022, 2:29 PM

## 2022-06-25 ENCOUNTER — Encounter: Payer: Medicare Other | Admitting: Physical Therapy

## 2022-06-26 ENCOUNTER — Ambulatory Visit (INDEPENDENT_AMBULATORY_CARE_PROVIDER_SITE_OTHER): Payer: Medicare Other | Admitting: Rehabilitative and Restorative Service Providers"

## 2022-06-26 ENCOUNTER — Encounter: Payer: Self-pay | Admitting: Rehabilitative and Restorative Service Providers"

## 2022-06-26 DIAGNOSIS — R2689 Other abnormalities of gait and mobility: Secondary | ICD-10-CM

## 2022-06-26 DIAGNOSIS — M25562 Pain in left knee: Secondary | ICD-10-CM

## 2022-06-26 DIAGNOSIS — R600 Localized edema: Secondary | ICD-10-CM

## 2022-06-26 DIAGNOSIS — M6281 Muscle weakness (generalized): Secondary | ICD-10-CM

## 2022-06-26 NOTE — Therapy (Signed)
OUTPATIENT PHYSICAL THERAPY TREATMENT NOTE   Patient Name: Veronica Weber MRN: [id number] DOB:Mar 15, 1956, 66 y.o., female Today's Date: 06/26/2022  DQQ:[id number], Crist Infante, PA-C   REFERRING PROVIDER: [name], MD  END OF SESSION:   PT End of Session - 06/26/22 1520     Visit Number 3    Number of Visits 12    Date for PT Re-Evaluation 07/29/22    Authorization Type UHC MCR    PT Start Time 1513    PT Stop Time 1552    PT Time Calculation (min) 39 min    Activity Tolerance Patient tolerated treatment well    Behavior During Therapy WFL for tasks assessed/performed              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   Hyperlipidemia    Hypertension    Thyroid disease    Tobacco use    Past Surgical History:  Procedure Laterality Date   APPENDECTOMY  1976   CATARACT EXTRACTION     1997, 1998   CESAREAN SECTION  1983   CHOLECYSTECTOMY  1998   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   Procedure: LEFT TOTAL KNEE ARTHROPLASTY;  Surgeon: [name], MD;  Location: [facility];  Service: Orthopedics;  Laterality: Left;   TUBAL LIGATION  1994   Patient Active Problem List   Diagnosis Date Noted   OA (osteoarthritis) of knee 05/22/2022   Arthritis of knee 05/22/2022   Current every day smoker 01/19/2022   Difficulty sleeping 01/19/2022   Obesity, Class III, BMI 40-49.9 (morbid obesity) ([facility]) 11/19/2021   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   Acute CVA (cerebrovascular accident) ([facility]) 11/18/2021   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   Essential hypertension 11/16/2021   Hyperlipidemia 11/16/2021   Tobacco use 11/16/2021   Syncope and collapse 06/26/2021      THERAPY DIAG:  Acute pain of left knee  Muscle weakness (generalized)  Other abnormalities of gait and mobility  Localized edema       REFERRING  DIAG: J19.417 (ICD-10-CM) - S/P total knee arthroplasty, left 05/22/22   Rationale for Evaluation and Treatment Rehabilitation   ONSET DATE: 05/22/22   SUBJECTIVE:    SUBJECTIVE STATEMENT: Pt indicated 3/10 pain upon arrival today.  Pt indicated increased pain after walking prolonged for several hours with SPC.    PERTINENT HISTORY:PMH Lt TKA CVA,HTN, HLD, Hypothyroidism, vasovagal syncope, obesity     PAIN:  NPRS scale: 3/10 Pain location: left lateral knee Pain description: ache Aggravating factors: walking prolonged Relieving factors: stretching the knee out   PRECAUTIONS: None   WEIGHT BEARING RESTRICTIONS No   FALLS:  Has patient fallen in last 6 months? 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   PLOF: Independent   PATIENT GOALS reduce pain, return to work as Conservation officer, nature     OBJECTIVE:     PATIENT SURVEYS:  06/17/2022 Eval: FOTO 52% functional   COGNITION: 06/17/2022           Overall cognitive status: Within functional limits for tasks assessed                          SENSATION: 06/17/2022 [facility]   EDEMA:  06/17/2022 Moderate in left knee at eval     LOWER EXTREMITY ROM:  AROM/PROM Right 06/17/2022 Left 06/17/2022 Left 06/26/2022  Hip flexion       Hip extension       Hip abduction       Hip adduction       Hip internal rotation       Hip external rotation       Knee flexion   93/105 AROM 115 in supine heel slide  Knee extension   2/0   Ankle dorsiflexion       Ankle plantarflexion       Ankle inversion       Ankle eversion        (Blank rows = not tested)   LOWER EXTREMITY MMT:   MMT Right 06/17/2022 Left 06/17/2022  Hip flexion   3+  Hip extension      Hip abduction   3+  Hip adduction      Hip internal rotation      Hip external rotation      Knee flexion   4  Knee extension   4  Ankle dorsiflexion      Ankle plantarflexion      Ankle inversion      Ankle eversion       (Blank rows = not tested)   LOWER EXTREMITY SPECIAL  TESTS:      FUNCTIONAL TESTS:  06/17/2022 5 times sit to stand: 12.93 seconds, uses UE to push up from lap   GAIT: 06/26/2022: Able to perform ambulation independent within clinic.   06/17/2022 Distance walked: 75 Assistive device utilized: RW Level of assistance: Modified independence Comments: able to walk 15 feet without AD with close supervision, does not use AD PLOF     TODAY'S TREATMENT:  06/26/2022: Therex: Nustep Lvl 5 8 mins UE/LE Seated Lt LAQ 3 lbs 2 x 15 c pause in end range each way Supine heel slide x1 Seated SLR Lt 2 x 10  Standing hip abduction x 10 bilateral c finger tip assist on bar Sit to stand to sit 20 inch table height no UE assist, slow lowering focus Incline calf stretch 30 sec x 3 bilateral  Neuro Re-ed Tandem stance 1 min x 1 bilateral Retro step Lt leg posterior x 20    Manual: Seated Lt knee flexion c mobilization c movement Ir/distraction    06/18/22 Nu step seat # 4 for 7min UE/LE L5 Gait 150 feet with SPC with supervision Sit to stands from bar stool X 10 Seated knee flexion stretch 10 sec X 10 Seated LAQ 2# 2X10 on Lt Standing H.S curl 2# 2X10 Standing hip abd 2# 2X10   Lt knee PROM flexion and extension with overpressure to tolerance      PATIENT EDUCATION: Education details: HEP, PT plan of care Person educated: Patient Education method: Explanation, Demonstration, Verbal cues, and Handouts Education comprehension: verbalized understanding and needs further education     HOME EXERCISE PROGRAM: Access Code: [email or web address] URL: [email or web address] Date: 06/17/2022 Prepared by: Elsie Ra   Exercises - Seated Straight Leg Raise with Quad Contraction  - 2 x daily - 6 x weekly - 2 sets - 10 reps - Sit to Stand Without Arm Support  - 2 x daily - 6 x weekly - 1 sets - 10 reps - Seated Knee Flexion Stretch  - 2 x daily - 6 x weekly - 1-2 sets - 10 reps - 5 sec hold   ASSESSMENT:   CLINICAL IMPRESSION:  Good  improvement in active range as noted above.  Continued  benefit to progressive strengthening and balance improvements.     OBJECTIVE IMPAIRMENTS: decreased activity tolerance, difficulty walking, decreased balance, decreased endurance, decreased mobility, decreased ROM, decreased strength, impaired flexibility, impaired LE use, postural dysfunction, and pain.   ACTIVITY LIMITATIONS: bending, lifting, carry, locomotion, cleaning, community activity, driving, and or occupation   PERSONAL FACTORS:  PMH Lt TKA CVA,HTN, HLD, Hypothyroidism, vasovagal syncope, obesityare also affecting patient's functional outcome.   REHAB POTENTIAL: Good   CLINICAL DECISION MAKING: Stable/uncomplicated   EVALUATION COMPLEXITY: Low       GOALS: Short term PT Goals Target date: 07/15/2022 Pt will be I and compliant with HEP. Baseline:  Goal status: on going - assessed 06/26/2022 Pt will decrease pain by 25% overall Baseline: Goal status: on going - assessed 06/26/2022   Long term PT goals Target date: 07/29/2022 Pt will improve Lt knee AROM 0-110 to improve functional mobility Baseline: Goal status: New Pt will improve  hip/knee strength to at least 5-/5 MMT to improve functional strength Baseline: Goal status: New Pt will improve FOTO to at least 59% functional to show improved function Baseline: Goal status: New Pt will reduce pain to overall less than 2-3/10 with usual activity and work activity. Baseline: Goal status: New PLAN: PT FREQUENCY: 2 times per week    PT DURATION: 6 weeks   PLANNED INTERVENTIONS (unless contraindicated): aquatic PT, Canalith repositioning, cryotherapy, Electrical stimulation, Iontophoresis with 4 mg/ml dexamethasome, Moist heat, traction, Ultrasound, gait training, Therapeutic exercise, balance training, neuromuscular re-education, patient/family education, prosthetic training, manual techniques, passive ROM, dry needling, taping, vasopnuematic device, vestibular,  spinal manipulations, joint manipulations   PLAN FOR NEXT SESSION: Progressive strengthening, functional WB training, balance improvements.    Chyrel Masson, PT, DPT, OCS, ATC 06/26/22  3:50 PM

## 2022-06-29 ENCOUNTER — Other Ambulatory Visit: Payer: Self-pay | Admitting: Physical Medicine and Rehabilitation

## 2022-06-30 ENCOUNTER — Telehealth: Payer: Self-pay | Admitting: Pharmacist

## 2022-06-30 ENCOUNTER — Encounter: Payer: Self-pay | Admitting: Physical Therapy

## 2022-06-30 ENCOUNTER — Ambulatory Visit: Payer: Medicare Other | Admitting: Physical Therapy

## 2022-06-30 DIAGNOSIS — M6281 Muscle weakness (generalized): Secondary | ICD-10-CM | POA: Diagnosis not present

## 2022-06-30 DIAGNOSIS — R2689 Other abnormalities of gait and mobility: Secondary | ICD-10-CM | POA: Diagnosis not present

## 2022-06-30 DIAGNOSIS — M25562 Pain in left knee: Secondary | ICD-10-CM

## 2022-06-30 DIAGNOSIS — R600 Localized edema: Secondary | ICD-10-CM | POA: Diagnosis not present

## 2022-06-30 NOTE — Progress Notes (Signed)
    Chronic Care Management Pharmacy Assistant   Name: ANACLARA ACKLIN  MRN: [id number] DOB: [birth year]   Reason for Encounter: Medication Coordination Call    Recent office visits:  03/05/2022 OV (PCP) Allwardt, Randa Evens, PA-C; Nicoderm 14 mg daily patch  Recent consult visits:  06/06/2022 [facility] (Orthopedics) Meredith Pel, MD; no medication changes.  05/20/2022 [facility] (Cardiology) Elgie Collard, PA-C; no medication changes.  05/13/2022 [facility] (Phys Med) Izora Ribas, MD; prescribed naltrexone to help with smoking cessation, discussed risks and benefits of this treatment  05/07/2022 OV (Orthopedics) Magnant, Charles L, PA-C; no medication changes.  04/28/2022 [facility] (Podiatry) Edrick Kins, DPM; Rx for Medrol Dose Pak placed, Rx for Meloxicam ordered for patient.  Hospital visits:  None in previous 6 months  Medications: Outpatient Encounter Medications as of 06/30/2022  Medication Sig   aspirin 81 MG chewable tablet Chew 1 tablet (81 mg total) by mouth 2 (two) times daily.   atorvastatin (LIPITOR) 80 MG tablet Take 1 tablet (80 mg total) by mouth daily.   chlorthalidone (HYGROTON) 25 MG tablet Take 1 tablet (25 mg total) by mouth daily. Take 1/2 tablet by mouth daily. (Patient taking differently: Take 12.5 mg by mouth daily.)   docusate sodium (COLACE) 100 MG capsule Take 1 capsule (100 mg total) by mouth 2 (two) times daily.   gabapentin (NEURONTIN) 300 MG capsule TAKE 1 TO 2 CAPSULES BY MOUTH EVERYDAY AT BEDTIME   HYDROcodone-acetaminophen (NORCO/VICODIN) 5-325 MG tablet Take 1-2 tablets by mouth every 4 (four) hours as needed for moderate pain.   losartan (COZAAR) 50 MG tablet Take 1 tablet (50 mg total) by mouth daily.   methocarbamol (ROBAXIN) 500 MG tablet Take 1 tablet (500 mg total) by mouth every 6 (six) hours as needed for muscle spasms.   Multiple Vitamins-Minerals (MULTIVITAMIN WITH MINERALS) tablet Take 1 tablet by mouth daily.   naproxen (NAPROSYN) 250 MG  tablet Take 1 tablet (250 mg total) by mouth 2 (two) times daily with a meal.   nicotine (NICODERM CQ - DOSED IN MG/24 HOURS) 14 mg/24hr patch Place 1 patch (14 mg total) onto the skin daily.   ondansetron (ZOFRAN) 4 MG tablet Take 1 tablet (4 mg total) by mouth every 6 (six) hours as needed for nausea.   No facility-administered encounter medications on file as of 06/30/2022.    **Unsuccessful attempt at reaching patient to complete this call.   Future Appointments  Date Time Provider [place]  06/30/2022  2:30 PM Debbe Odea, PT [facility] None  07/02/2022  2:30 PM Debbe Odea, PT [facility] None  07/04/2022  9:30 AM Marlou Sa Tonna Corner, MD [facility] None  07/07/2022  2:30 PM Faustino Congress F, PT [facility] None  07/09/2022  1:45 PM Elsie Ra R, PT [facility] None  07/14/2022  2:30 PM Elsie Ra R, PT [facility] None  07/16/2022  2:30 PM Elsie Ra R, PT [facility] None  07/17/2022 12:00 PM [facility] None  07/17/2022  1:00 PM Maryjane Hurter, MD [facility] None  07/21/2022  2:30 PM Elsie Ra R, PT [facility] None  07/23/2022  2:30 PM Debbe Odea, PT [facility] None  07/28/2022  9:00 AM Allwardt, Randa Evens, PA-C [facility]  08/18/2022  2:45 PM [facility] PHARMACIST [facility]   April D Calhoun, [place] Pharmacist Assistant [phone]

## 2022-06-30 NOTE — Therapy (Signed)
OUTPATIENT PHYSICAL THERAPY TREATMENT NOTE   Patient Name: Veronica Weber MRN: [id number] DOB:11/29/1955, 66 y.o., female Today's Date: 06/30/2022  [id number], Randa Evens, PA-C   REFERRING PROVIDER: Meredith Pel, MD  END OF SESSION:   PT End of Session - 06/30/22 1439     Visit Number 4    Number of Visits 12    Date for PT Re-Evaluation 07/29/22    Authorization Type UHC MCR    PT Start Time 8099    PT Stop Time 8338    PT Time Calculation (min) 55 min    Activity Tolerance Patient tolerated treatment well    Behavior During Therapy [facility] for tasks assessed/performed               Past Medical History:  Diagnosis Date   Abortion    x3   Arthritis    Cataract    Emphysema/COPD ([place])    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   Hyperlipidemia    Hypertension    Thyroid disease    Tobacco use    Past Surgical History:  Procedure Laterality Date   [place] ARTHROPLASTY Left 05/22/2022   Procedure: LEFT TOTAL KNEE ARTHROPLASTY;  Surgeon: Meredith Pel, MD;  Location: [place];  Service: Orthopedics;  Laterality: Left;   TUBAL LIGATION  1994   Patient Active Problem List   Diagnosis Date Noted   OA (osteoarthritis) of knee 05/22/2022   Arthritis of knee 05/22/2022   Current every day smoker 01/19/2022   Difficulty sleeping 01/19/2022   Obesity, Class III, BMI 40-49.9 (morbid obesity) ([place]) 11/19/2021   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   Acute CVA (cerebrovascular accident) ([place]) 11/18/2021   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   Essential hypertension 11/16/2021   Hyperlipidemia 11/16/2021   Tobacco use 11/16/2021   Syncope and collapse 06/26/2021      THERAPY DIAG:  Acute pain of left knee  Muscle weakness (generalized)  Other abnormalities of gait and mobility  Localized edema       REFERRING  DIAG: S50.539 (ICD-10-CM) - S/P total knee arthroplasty, left 05/22/22   Rationale for Evaluation and Treatment Rehabilitation   ONSET DATE: 05/22/22   SUBJECTIVE:    SUBJECTIVE STATEMENT: She has been doing exercises but "not as much as should."   PERTINENT HISTORY:PMH Lt TKA CVA,HTN, HLD, Hypothyroidism, vasovagal syncope, obesity     PAIN:  NPRS scale: today 2-3/10 with pain meds prior to PT and since last PT ranging 2/10 - 6/10 Pain location: left lateral knee Pain description: ache Aggravating factors: walking prolonged Relieving factors: stretching the knee out   PRECAUTIONS: None   WEIGHT BEARING RESTRICTIONS No   FALLS:  Has patient fallen in last 6 months? 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   PLOF: Independent   PATIENT GOALS reduce pain, return to work as Scientist, water quality     OBJECTIVE:     PATIENT SURVEYS:  06/17/2022 Eval: FOTO 52% functional   COGNITION: 06/17/2022           Overall cognitive status: Within functional limits for tasks assessed                          SENSATION: 06/17/2022 [facility]   EDEMA:  06/17/2022 Moderate in left knee at eval  LOWER EXTREMITY ROM:    AROM/PROM Right 06/17/2022 Left 06/17/2022 Left 06/26/22 Left 06/30/22  Hip flexion        Hip extension        Hip abduction        Hip adduction        Hip internal rotation        Hip external rotation        Knee flexion   93/105 AROM 115 in supine heel slide Seated P: 118* A: 113*  Knee extension   2/0  Seated LAQ A: -11* P: 0*  Ankle dorsiflexion        Ankle plantarflexion        Ankle inversion        Ankle eversion         (Blank rows = not tested)   LOWER EXTREMITY MMT:   MMT Right 06/17/2022 Left 06/17/2022  Hip flexion   3+  Hip extension      Hip abduction   3+  Hip adduction      Hip internal rotation      Hip external rotation      Knee flexion   4  Knee extension   4  Ankle dorsiflexion      Ankle plantarflexion      Ankle inversion       Ankle eversion       (Blank rows = not tested)   LOWER EXTREMITY SPECIAL TESTS:      FUNCTIONAL TESTS:  06/17/2022 5 times sit to stand: 12.93 seconds, uses UE to push up from lap   GAIT: 06/26/2022: Able to perform ambulation independent within clinic.   06/17/2022 Distance walked: 75 Assistive device utilized: RW Level of assistance: Modified independence Comments: able to walk 15 feet without AD with close supervision, does not use AD PLOF     TODAY'S TREATMENT:  06/30/2022 Therex: Nustep seat 4 for 8 min total Lvl 7 with BUE/BLE for 5 min & level 4 with LEs only 3 min Seated Lt LAQ with strap assist for terminal knee ext 5 sec hold 10 reps.  Gastroc stretch 30 sec x 2 LLE Heel raises 10 reps LUE support reaching RUE overhead Step up & down LLE 4" step BUE support 10 reps  Neuro Re-ed Tandem stance on foam beam 30 sec LLE in front & in back with intermittent touch Sidestepping on foam beam 3 laps right & left with light BUE support LLE SLS on foam tapping cone RLE 10 reps  Manual: Seated Lt knee flexion c mobilization c movement Ir/distraction Seated PROM lt knee extension  Self-Care: PT demo & verbal cues on scar mobs. Pt verbalized understanding.   Vaso:  Left knee 34* medium compression for 10 min.  06/26/2022: Therex: Nustep Lvl 5 8 mins UE/LE Seated Lt LAQ 3 lbs 2 x 15 c pause in end range each way Supine heel slide x1 Seated SLR Lt 2 x 10  Standing hip abduction x 10 bilateral c finger tip assist on bar Sit to stand to sit 20 inch table height no UE assist, slow lowering focus Incline calf stretch 30 sec x 3 bilateral  Neuro Re-ed Tandem stance 1 min x 1 bilateral Retro step Lt leg posterior x 20    Manual: Seated Lt knee flexion c mobilization c movement Ir/distraction    06/18/22 Nu step seat # 4 for UE/LE L5 Gait 150 feet with SPC with supervision Sit to stands from bar stool X  10 Seated knee flexion stretch 10 sec X 10 Seated  LAQ 2# 2X10 on Lt Standing H.S curl 2# 2X10 Standing hip abd 2# 2X10   Lt knee PROM flexion and extension with overpressure to tolerance      PATIENT EDUCATION: Education details: HEP, PT plan of care Person educated: Patient Education method: Explanation, Demonstration, Verbal cues, and Handouts Education comprehension: verbalized understanding and needs further education     HOME EXERCISE PROGRAM: Access Code: [email or web address] URL: [email or web address] Date: 06/17/2022 Prepared by: Ivery Quale   Exercises - Seated Straight Leg Raise with Quad Contraction  - 2 x daily - 6 x weekly - 2 sets - 10 reps - Sit to Stand Without Arm Support  - 2 x daily - 6 x weekly - 1 sets - 10 reps - Seated Knee Flexion Stretch  - 2 x daily - 6 x weekly - 1-2 sets - 10 reps - 5 sec hold   ASSESSMENT:   CLINICAL IMPRESSION:  Patient appears to understand scar mobs for home.  Pt was challenged by standing balance activities and functional activities.     OBJECTIVE IMPAIRMENTS: decreased activity tolerance, difficulty walking, decreased balance, decreased endurance, decreased mobility, decreased ROM, decreased strength, impaired flexibility, impaired LE use, postural dysfunction, and pain.   ACTIVITY LIMITATIONS: bending, lifting, carry, locomotion, cleaning, community activity, driving, and or occupation   PERSONAL FACTORS:  PMH Lt TKA CVA,HTN, HLD, Hypothyroidism, vasovagal syncope, obesityare also affecting patient's functional outcome.   REHAB POTENTIAL: Good   CLINICAL DECISION MAKING: Stable/uncomplicated   EVALUATION COMPLEXITY: Low       GOALS: Short term PT Goals Target date: 07/15/2022 Pt will be I and compliant with HEP. Baseline:  Goal status: on going - assessed 06/26/2022 Pt will decrease pain by 25% overall Baseline: Goal status: on going - assessed 06/26/2022   Long term PT goals Target date: 07/29/2022 Pt will improve Lt knee AROM 0-110 to improve functional  mobility Baseline: Goal status: New Pt will improve  hip/knee strength to at least 5-/5 MMT to improve functional strength Baseline: Goal status: New Pt will improve FOTO to at least 59% functional to show improved function Baseline: Goal status: New Pt will reduce pain to overall less than 2-3/10 with usual activity and work activity. Baseline: Goal status: New PLAN: PT FREQUENCY: 2 times per week    PT DURATION: 6 weeks   PLANNED INTERVENTIONS (unless contraindicated): aquatic PT, Canalith repositioning, cryotherapy, Electrical stimulation, Iontophoresis with 4 mg/ml dexamethasome, Moist heat, traction, Ultrasound, gait training, Therapeutic exercise, balance training, neuromuscular re-education, patient/family education, prosthetic training, manual techniques, passive ROM, dry needling, taping, vasopnuematic device, vestibular, spinal manipulations, joint manipulations   PLAN FOR NEXT SESSION:  send note to Dr. August Saucer prior to appt 11/3.  Progressive strengthening, functional WB training, balance improvements.     Vladimir Faster, PT, DPT 06/30/2022, 4:32 PM

## 2022-07-01 ENCOUNTER — Other Ambulatory Visit: Payer: Self-pay | Admitting: Physician Assistant

## 2022-07-02 ENCOUNTER — Encounter: Payer: Self-pay | Admitting: Physical Therapy

## 2022-07-02 ENCOUNTER — Ambulatory Visit: Payer: Medicare Other | Admitting: Physical Therapy

## 2022-07-02 DIAGNOSIS — M6281 Muscle weakness (generalized): Secondary | ICD-10-CM

## 2022-07-02 DIAGNOSIS — M25562 Pain in left knee: Secondary | ICD-10-CM | POA: Diagnosis not present

## 2022-07-02 DIAGNOSIS — R600 Localized edema: Secondary | ICD-10-CM

## 2022-07-02 DIAGNOSIS — R2689 Other abnormalities of gait and mobility: Secondary | ICD-10-CM

## 2022-07-02 NOTE — Therapy (Signed)
OUTPATIENT PHYSICAL THERAPY TREATMENT NOTE/Progress note Progress Note reporting period 06/17/22 to 07/02/22  See below for objective and subjective measurements relating to patients progress with PT.    Patient Name: Veronica Weber MRN: [id number] DOB:1956/08/21, 66 y.o., female Today's Date: 07/02/2022  [id number], Randa Evens, PA-C   REFERRING PROVIDER: Meredith Pel, MD  END OF SESSION:   PT End of Session - 07/02/22 1444     Visit Number 5    Number of Visits 12    Date for PT Re-Evaluation 07/29/22    Authorization Type UHC MCR    Progress Note Due on Visit 15   first one done visit #5   PT Start Time 1430    PT Stop Time 1515    PT Time Calculation (min) 45 min    Activity Tolerance Patient tolerated treatment well    Behavior During Therapy [facility] for tasks assessed/performed               Past Medical History:  Diagnosis Date   Abortion    x3   Arthritis    Cataract    Emphysema/COPD ([place])    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   Hyperlipidemia    Hypertension    Thyroid disease    Tobacco use    Past Surgical History:  Procedure Laterality Date   [place] ARTHROPLASTY Left 05/22/2022   Procedure: LEFT TOTAL KNEE ARTHROPLASTY;  Surgeon: Meredith Pel, MD;  Location: [place];  Service: Orthopedics;  Laterality: Left;   TUBAL LIGATION  1994   Patient Active Problem List   Diagnosis Date Noted   OA (osteoarthritis) of knee 05/22/2022   Arthritis of knee 05/22/2022   Current every day smoker 01/19/2022   Difficulty sleeping 01/19/2022   Obesity, Class III, BMI 40-49.9 (morbid obesity) ([place]) 11/19/2021   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   Acute CVA (cerebrovascular accident) ([place]) 11/18/2021   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   Essential hypertension 11/16/2021   Hyperlipidemia 11/16/2021   Tobacco  use 11/16/2021   Syncope and collapse 06/26/2021      THERAPY DIAG:  Acute pain of left knee  Muscle weakness (generalized)  Other abnormalities of gait and mobility  Localized edema       REFERRING DIAG: [id number] (ICD-10-CM) - S/P total knee arthroplasty, left 05/22/22   Rationale for Evaluation and Treatment Rehabilitation   ONSET DATE: 05/22/22   SUBJECTIVE:    SUBJECTIVE STATEMENT: She relays the pain is not too bad today in her kne   PERTINENT HISTORY:PMH Lt TKA CVA,HTN, HLD, Hypothyroidism, vasovagal syncope, obesity     PAIN:  NPRS scale: today 2-3/10 with pain meds prior to PT and since last PT ranging 2/10 - 6/10 Pain location: left lateral knee Pain description: ache Aggravating factors: walking prolonged Relieving factors: stretching the knee out   PRECAUTIONS: None   WEIGHT BEARING RESTRICTIONS No   FALLS:  Has patient fallen in last 6 months? 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   PLOF: Independent   PATIENT GOALS reduce pain, return to work as Scientist, water quality     OBJECTIVE:     PATIENT SURVEYS:  06/17/2022 Eval: FOTO 52% functional   COGNITION: 06/17/2022           Overall cognitive status: Within functional limits for tasks assessed  SENSATION: 06/17/2022 WFL   EDEMA:  06/17/2022 Moderate in left knee at eval     LOWER EXTREMITY ROM:    AROM/PROM Right 06/17/2022 Left 06/17/2022 Left 06/26/22 Left 06/30/22  Hip flexion        Hip extension        Hip abduction        Hip adduction        Hip internal rotation        Hip external rotation        Knee flexion   93/105 AROM 115 in supine heel slide Seated P: 118* A: 113*  Knee extension   2/0  Seated LAQ A: -11* P: 0*  Ankle dorsiflexion        Ankle plantarflexion        Ankle inversion        Ankle eversion         (Blank rows = not tested)   LOWER EXTREMITY MMT:   MMT Left 06/17/2022 Left 07/02/22  Hip flexion 3+   Hip extension      Hip abduction 3+   Hip adduction     Hip internal rotation     Hip external rotation     Knee flexion 4 4+  Knee extension 4 4+  Ankle dorsiflexion     Ankle plantarflexion     Ankle inversion     Ankle eversion      (Blank rows = not tested)   LOWER EXTREMITY SPECIAL TESTS:      FUNCTIONAL TESTS:  06/17/2022 5 times sit to stand: 12.93 seconds, uses UE to push up from lap   GAIT: 06/26/2022: Able to perform ambulation independent within clinic.   06/17/2022 Distance walked: 75 Assistive device utilized: RW Level of assistance: Modified independence Comments: able to walk 15 feet without AD with close supervision, does not use AD PLOF     TODAY'S TREATMENT:  07/02/22/2023 Therex: Recumbent bike X 25min L1 Seated Lt LAQ 4# 2X15 Gastroc stretch 30 sec x 2 LLE on slantboard Heel and toe raises X 15 bilat Step ups onto 4 inch step X 10 leading with left Leg press DL 50# X 15, then left leg only 25# X 15 Seated SLR 2X10 Tandem stance on foam beam 30 sec  X2 Sit to stand no UE support X 10   Manual: Seated Lt knee flexion c mobilization c movement Ir/distraction Seated PROM lt knee extension    Vaso:  Left knee 34* medium compression for 10 min.  06/30/2022 Therex: Nustep seat 4 for 8 min total Lvl 7 with BUE/BLE for 5 min & level 4 with LEs only 3 min Seated Lt LAQ with strap assist for terminal knee ext 5 sec hold 10 reps.  Gastroc stretch 30 sec x 2 LLE Heel raises 10 reps LUE support reaching RUE overhead Step up & down LLE 4" step BUE support 10 reps  Neuro Re-ed Tandem stance on foam beam 30 sec LLE in front & in back with intermittent touch Sidestepping on foam beam 3 laps right & left with light BUE support LLE SLS on foam tapping cone RLE 10 reps  Manual: Seated Lt knee flexion c mobilization c movement Ir/distraction Seated PROM lt knee extension  Self-Care: PT demo & verbal cues on scar mobs. Pt verbalized understanding.   Vaso:  Left knee  34* medium compression for 10 min.     PATIENT EDUCATION: Education details: HEP, PT plan of care Person educated: Patient Education  method: Explanation, Demonstration, Verbal cues, and Handouts Education comprehension: verbalized understanding and needs further education     HOME EXERCISE PROGRAM: Access Code: [email or web address] URL: [email or web address] Date: 06/17/2022 Prepared by: Elsie Ra   Exercises - Seated Straight Leg Raise with Quad Contraction  - 2 x daily - 6 x weekly - 2 sets - 10 reps - Sit to Stand Without Arm Support  - 2 x daily - 6 x weekly - 1 sets - 10 reps - Seated Knee Flexion Stretch  - 2 x daily - 6 x weekly - 1-2 sets - 10 reps - 5 sec hold   ASSESSMENT:   CLINICAL IMPRESSION:  Progress note reflects she has made great overall progress with her knee recovery S/P Lt TKA. She is progressing very well toward her PT goals, and will continue to benefit from 2-3 more weeks of PT to maximize her function.     OBJECTIVE IMPAIRMENTS: decreased activity tolerance, difficulty walking, decreased balance, decreased endurance, decreased mobility, decreased ROM, decreased strength, impaired flexibility, impaired LE use, postural dysfunction, and pain.   ACTIVITY LIMITATIONS: bending, lifting, carry, locomotion, cleaning, community activity, driving, and or occupation   PERSONAL FACTORS:  PMH Lt TKA CVA,HTN, HLD, Hypothyroidism, vasovagal syncope, obesityare also affecting patient's functional outcome.   REHAB POTENTIAL: Good   CLINICAL DECISION MAKING: Stable/uncomplicated   EVALUATION COMPLEXITY: Low       GOALS: Short term PT Goals Target date: 07/15/2022 Pt will be I and compliant with HEP. Baseline:  Goal status: on going - assessed 06/26/2022 Pt will decrease pain by 25% overall Baseline: Goal status: on going - assessed 06/26/2022   Long term PT goals Target date: 07/29/2022 Pt will improve Lt knee AROM 0-110 to improve functional  mobility Baseline: Goal status: New Pt will improve  hip/knee strength to at least 5-/5 MMT to improve functional strength Baseline: Goal status: New Pt will improve FOTO to at least 59% functional to show improved function Baseline: Goal status: New Pt will reduce pain to overall less than 2-3/10 with usual activity and work activity. Baseline: Goal status: New PLAN: PT FREQUENCY: 2 times per week    PT DURATION: 6 weeks   PLANNED INTERVENTIONS (unless contraindicated): aquatic PT, Canalith repositioning, cryotherapy, Electrical stimulation, Iontophoresis with 4 mg/ml dexamethasome, Moist heat, traction, Ultrasound, gait training, Therapeutic exercise, balance training, neuromuscular re-education, patient/family education, prosthetic training, manual techniques, passive ROM, dry needling, taping, vasopnuematic device, vestibular, spinal manipulations, joint manipulations   PLAN FOR NEXT SESSION:  Progressive strengthening, functional WB training, balance improvements.     Debbe Odea, PT, DPT 07/02/2022, 3:06 PM

## 2022-07-04 ENCOUNTER — Ambulatory Visit (INDEPENDENT_AMBULATORY_CARE_PROVIDER_SITE_OTHER): Payer: Medicare Other | Admitting: Orthopedic Surgery

## 2022-07-04 ENCOUNTER — Encounter: Payer: Self-pay | Admitting: Orthopedic Surgery

## 2022-07-04 DIAGNOSIS — Z96652 Presence of left artificial knee joint: Secondary | ICD-10-CM

## 2022-07-04 MED ORDER — HYDROCODONE-ACETAMINOPHEN 5-325 MG PO TABS: 1.0000 | ORAL_TABLET | Freq: Every evening | ORAL | 0 refills | Status: DC | PRN

## 2022-07-06 ENCOUNTER — Encounter: Payer: Self-pay | Admitting: Orthopedic Surgery

## 2022-07-06 NOTE — Progress Notes (Signed)
Post-Op Visit Note   Patient: Veronica Weber           Date of Birth: [birth year]           MRN: [id number] Visit Date: 07/04/2022 PCP: Fredirick Lathe, PA-C   Assessment & Plan:  Chief Complaint:  Chief Complaint  Patient presents with   Left Knee - Routine Post Op   Visit Diagnoses:  1. S/P total knee arthroplasty, left     Plan: Patient is a 66 year old female who presents s/p left total knee arthroplasty on 05/22/2022.  Patient states that she is still having continued pain, primarily at night.  This will occasionally keep her up and she does note some muscle spasms in her thigh as well.  She denies any fevers, chills, night sweats, calf pain, chest pain, shortness of breath.  Overall she feels that her daytime pain has been improving but she is more concerned about the nighttime pain that is fairly persistent.  She feels her mobility is improving and her range of motion has progressed well with physical therapy.  She is reached about 120 degrees and PT at most.  On exam, she has 3 degrees of left knee extension and 115 degrees of knee flexion.  Small effusion is present.  Incision is healing well without any evidence of infection or dehiscence.  There is no sinus tract noted.  No calf tenderness.  She has excellent quad strength rated 5/5 and is able to perform straight leg raise without extensor lag.  There is no gross mid flexion instability noted.  Plan is to prescribe Norco to take at night and she will take Tylenol and muscle laxer during the day to help control her symptoms.  She is okay to drive but counseled her to avoid taking the muscle relaxer or hydrocodone when she is planning on driving.  She will work-up from neighborhood driving to eventual highway driving after about a month of regular driving.  Follow-up in 6 weeks for final check with Dr. Marlou Sa unless she is having persistent symptoms.  There is no concern for prosthetic joint infection at this time.  She  understands that it is normal to have some persistent nighttime pain 6 weeks after knee replacement and full recovery from the surgery can take 9 to 12 months.  Follow-Up Instructions: No follow-ups on file.   Orders:  No orders of the defined types were placed in this encounter.  Meds ordered this encounter  Medications   HYDROcodone-acetaminophen (NORCO/VICODIN) 5-325 MG tablet    Sig: Take 1 tablet by mouth at bedtime as needed for moderate pain.    Dispense:  30 tablet    Refill:  0    Imaging: No results found.  PMFS History: Patient Active Problem List   Diagnosis Date Noted   OA (osteoarthritis) of knee 05/22/2022   Arthritis of knee 05/22/2022   Current every day smoker 01/19/2022   Difficulty sleeping 01/19/2022   Obesity, Class III, BMI 40-49.9 (morbid obesity) ([place]) 11/19/2021   Right middle cerebral artery stroke ([place]) 11/19/2021   Acute CVA (cerebrovascular accident) ([place]) 11/18/2021   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   Essential hypertension 11/16/2021   Hyperlipidemia 11/16/2021   Tobacco use 11/16/2021   Syncope and collapse 06/26/2021   Past Medical History:  Diagnosis Date   Abortion    x3   Arthritis    Cataract    Emphysema/COPD ([place])    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   Hyperlipidemia  Hypertension    Thyroid disease    Tobacco use     Family History  Problem Relation Age of Onset   Arthritis Mother    Cancer Maternal Grandmother    Diabetes Maternal Grandmother    Diabetes Maternal Grandfather    Healthy Daughter    Healthy Son     Past Surgical History:  Procedure Laterality Date   APPENDECTOMY  1976   CATARACT EXTRACTION     1997, [place]   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   Procedure: LEFT TOTAL KNEE ARTHROPLASTY;  Surgeon: Meredith Pel, MD;  Location: [place];  Service: Orthopedics;  Laterality: Left;   TUBAL LIGATION  1994   Social History   Occupational  History   Not on file  Tobacco Use   Smoking status: Every Day    Packs/day: 0.25    Years: 45.00    Total pack years: 11.25    Types: Cigarettes    Start date: 06/01/1976   Smokeless tobacco: Never  Vaping Use   Vaping Use: Never used  Substance and Sexual Activity   Alcohol use: Not Currently   Drug use: Never   Sexual activity: Not Currently

## 2022-07-07 ENCOUNTER — Encounter: Payer: Self-pay | Admitting: Physical Therapy

## 2022-07-07 ENCOUNTER — Ambulatory Visit: Payer: Medicare Other | Admitting: Physical Therapy

## 2022-07-07 ENCOUNTER — Encounter: Payer: Self-pay | Admitting: Physician Assistant

## 2022-07-07 DIAGNOSIS — M6281 Muscle weakness (generalized): Secondary | ICD-10-CM | POA: Diagnosis not present

## 2022-07-07 DIAGNOSIS — M25562 Pain in left knee: Secondary | ICD-10-CM

## 2022-07-07 DIAGNOSIS — R2689 Other abnormalities of gait and mobility: Secondary | ICD-10-CM | POA: Diagnosis not present

## 2022-07-07 DIAGNOSIS — R600 Localized edema: Secondary | ICD-10-CM | POA: Diagnosis not present

## 2022-07-07 NOTE — Therapy (Signed)
OUTPATIENT PHYSICAL THERAPY TREATMENT NOTE   Patient Name: Veronica Weber MRN: [id number] DOB:Jan 03, 1956, 66 y.o., female Today's Date: 07/07/2022  WNI:[id number], Crist Infante, PA-C   REFERRING PROVIDER: [name], MD  END OF SESSION:   PT End of Session - 07/07/22 1437     Visit Number 6    Number of Visits 12    Date for PT Re-Evaluation 07/29/22    Authorization Type U[employer] MCR    Progress Note Due on Visit 15   first one done visit #5   PT Start Time 1432    PT Stop Time 1511    PT Time Calculation (min) 39 min    Activity Tolerance Patient tolerated treatment well    Behavior During Therapy [facility] for tasks assessed/performed                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   Hyperlipidemia    Hypertension    Thyroid disease    Tobacco use    Past Surgical History:  Procedure Laterality Date   APPENDECTOMY  1976   CATARACT EXTRACTION     1997, 1998   CESAREAN SECTION  1983   CHOLECYSTECTOMY  1998   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   Procedure: LEFT TOTAL KNEE ARTHROPLASTY;  Surgeon: [name], MD;  Location: [facility];  Service: Orthopedics;  Laterality: Left;   TUBAL LIGATION  1994   Patient Active Problem List   Diagnosis Date Noted   OA (osteoarthritis) of knee 05/22/2022   Arthritis of knee 05/22/2022   Current every day smoker 01/19/2022   Difficulty sleeping 01/19/2022   Obesity, Class III, BMI 40-49.9 (morbid obesity) ([facility]) 11/19/2021   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   Acute CVA (cerebrovascular accident) ([facility]) 11/18/2021   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   Essential hypertension 11/16/2021   Hyperlipidemia 11/16/2021   Tobacco use 11/16/2021   Syncope and collapse 06/26/2021      THERAPY DIAG:  Acute pain of left knee  Muscle weakness (generalized)  Localized  edema  Other abnormalities of gait and mobility       REFERRING DIAG: X38.182 (ICD-10-CM) - S/P total knee arthroplasty, left 05/22/22   Rationale for Evaluation and Treatment Rehabilitation   ONSET DATE: 05/22/22   SUBJECTIVE:    SUBJECTIVE STATEMENT: Doing well, knee is still stiff    PERTINENT HISTORY:PMH Lt TKA CVA,HTN, HLD, Hypothyroidism, vasovagal syncope, obesity     PAIN:  NPRS scale: today 4/10 with pain meds prior to PT and since last PT ranging 2/10 - 6/10 Pain location: left lateral knee Pain description: ache Aggravating factors: walking prolonged Relieving factors: stretching the knee out   PRECAUTIONS: None   WEIGHT BEARING RESTRICTIONS No   FALLS:  Has patient fallen in last 6 months? 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   PLOF: Independent   PATIENT GOALS reduce pain, return to work as Conservation officer, nature     OBJECTIVE:     PATIENT SURVEYS:  06/17/2022 Eval: FOTO 52% functional   COGNITION: 06/17/2022           Overall cognitive status: Within functional limits for tasks assessed                          SENSATION: 06/17/2022 [facility]  EDEMA:  06/17/2022 Moderate in left knee at eval     LOWER EXTREMITY ROM:    AROM/PROM Right 06/17/2022 Left 06/17/2022 Left 06/26/22 Left 06/30/22 Left 07/07/22  Hip flexion         Hip extension         Hip abduction         Hip adduction         Hip internal rotation         Hip external rotation         Knee flexion   93/105 AROM 115 in supine heel slide Seated P: 118* A: 113*   Knee extension   2/0  Seated LAQ A: -11* P: 0* Seated LAQ: -3*  Ankle dorsiflexion         Ankle plantarflexion         Ankle inversion         Ankle eversion          (Blank rows = not tested)   LOWER EXTREMITY MMT:   MMT Left 06/17/2022 Left 07/02/22  Hip flexion 3+   Hip extension     Hip abduction 3+   Hip adduction     Hip internal rotation     Hip external rotation     Knee flexion 4 4+  Knee  extension 4 4+  Ankle dorsiflexion     Ankle plantarflexion     Ankle inversion     Ankle eversion      (Blank rows = not tested)   LOWER EXTREMITY SPECIAL TESTS:      FUNCTIONAL TESTS:  06/17/2022 5 times sit to stand: 12.93 seconds, uses UE to push up from lap   GAIT: 06/26/2022: Able to perform ambulation independent within clinic.   06/17/2022 Distance walked: 75 Assistive device utilized: RW Level of assistance: Modified independence Comments: able to walk 15 feet without AD with close supervision, does not use AD PLOF     TODAY'S TREATMENT:  07/07/22 TherEx Recumbent bike x 8 min; seat 3 (no resistance to L2) Lt LAQ 4# 3x10; 3 sec hold Seated Lt SLR x 10 reps with 3 sec hold Leg Press 50# 3x10; single leg 25# 2x10 performed bil   07/02/22 Therex: Recumbent bike X L1 Seated Lt LAQ 4# 2X15 Gastroc stretch 30 sec x 2 LLE on slantboard Heel and toe raises X 15 bilat Step ups onto 4 inch step X 10 leading with left Leg press DL 40# X 15, then left leg only 25# X 15 Seated SLR 2X10 Tandem stance on foam beam 30 sec  X2 Sit to stand no UE support X 10   Manual: Seated Lt knee flexion c mobilization c movement Ir/distraction Seated PROM lt knee extension    Vaso:  Left knee 34* medium compression for 10 min.  06/30/2022 Therex: Nustep seat 4 for 8 min total Lvl 7 with BUE/BLE for 5 min & level 4 with LEs only 3 min Seated Lt LAQ with strap assist for terminal knee ext 5 sec hold 10 reps.  Gastroc stretch 30 sec x 2 LLE Heel raises 10 reps LUE support reaching RUE overhead Step up & down LLE 4" step BUE support 10 reps  Neuro Re-ed Tandem stance on foam beam 30 sec LLE in front & in back with intermittent touch Sidestepping on foam beam 3 laps right & left with light BUE support LLE SLS on foam tapping cone RLE 10 reps  Manual: Seated Lt knee flexion  c mobilization c movement Ir/distraction Seated PROM lt knee extension  Self-Care: PT demo &  verbal cues on scar mobs. Pt verbalized understanding.   Vaso:  Left knee 34* medium compression for 10 min.     PATIENT EDUCATION: Education details: HEP, PT plan of care Person educated: Patient Education method: Explanation, Demonstration, Verbal cues, and Handouts Education comprehension: verbalized understanding and needs further education     HOME EXERCISE PROGRAM: Access Code: [email or web address] URL: [email or web address] Date: 06/17/2022 Prepared by: Elsie Ra   Exercises - Seated Straight Leg Raise with Quad Contraction  - 2 x daily - 6 x weekly - 2 sets - 10 reps - Sit to Stand Without Arm Support  - 2 x daily - 6 x weekly - 1 sets - 10 reps - Seated Knee Flexion Stretch  - 2 x daily - 6 x weekly - 1-2 sets - 10 reps - 5 sec hold   ASSESSMENT:   CLINICAL IMPRESSION:  Pt tolerated session well today with continued focus on strengthening.  Active extension improved today.  Will continue to benefit from PT to maximize function.   OBJECTIVE IMPAIRMENTS: decreased activity tolerance, difficulty walking, decreased balance, decreased endurance, decreased mobility, decreased ROM, decreased strength, impaired flexibility, impaired LE use, postural dysfunction, and pain.   ACTIVITY LIMITATIONS: bending, lifting, carry, locomotion, cleaning, community activity, driving, and or occupation   PERSONAL FACTORS:  PMH Lt TKA CVA,HTN, HLD, Hypothyroidism, vasovagal syncope, obesityare also affecting patient's functional outcome.   REHAB POTENTIAL: Good   CLINICAL DECISION MAKING: Stable/uncomplicated   EVALUATION COMPLEXITY: Low       GOALS: Short term PT Goals Target date: 07/15/2022 Pt will be I and compliant with HEP. Baseline:  Goal status: on going - assessed 06/26/2022 Pt will decrease pain by 25% overall Baseline: Goal status: on going - assessed 06/26/2022   Long term PT goals Target date: 07/29/2022 Pt will improve Lt knee AROM 0-110 to improve functional  mobility Baseline: Goal status: New Pt will improve  hip/knee strength to at least 5-/5 MMT to improve functional strength Baseline: Goal status: New Pt will improve FOTO to at least 59% functional to show improved function Baseline: Goal status: New Pt will reduce pain to overall less than 2-3/10 with usual activity and work activity. Baseline: Goal status: New PLAN: PT FREQUENCY: 2 times per week    PT DURATION: 6 weeks   PLANNED INTERVENTIONS (unless contraindicated): aquatic PT, Canalith repositioning, cryotherapy, Electrical stimulation, Iontophoresis with 4 mg/ml dexamethasome, Moist heat, traction, Ultrasound, gait training, Therapeutic exercise, balance training, neuromuscular re-education, patient/family education, prosthetic training, manual techniques, passive ROM, dry needling, taping, vasopnuematic device, vestibular, spinal manipulations, joint manipulations   PLAN FOR NEXT SESSION:  Progressive strengthening, functional WB training, balance improvements.     Faustino Congress, PT, DPT 07/07/2022, 3:12 PM

## 2022-07-09 ENCOUNTER — Encounter: Payer: Self-pay | Admitting: Physical Therapy

## 2022-07-09 ENCOUNTER — Ambulatory Visit: Payer: Medicare Other | Admitting: Physical Therapy

## 2022-07-09 DIAGNOSIS — M6281 Muscle weakness (generalized): Secondary | ICD-10-CM | POA: Diagnosis not present

## 2022-07-09 DIAGNOSIS — R2689 Other abnormalities of gait and mobility: Secondary | ICD-10-CM | POA: Diagnosis not present

## 2022-07-09 DIAGNOSIS — R600 Localized edema: Secondary | ICD-10-CM | POA: Diagnosis not present

## 2022-07-09 DIAGNOSIS — M25562 Pain in left knee: Secondary | ICD-10-CM

## 2022-07-09 NOTE — Therapy (Signed)
OUTPATIENT PHYSICAL THERAPY TREATMENT NOTE   Patient Name: Veronica Weber MRN: [id number] DOB:30-Sep-1955, 66 y.o., female Today's Date: 07/09/2022  OEU:[id number], Crist Infante, PA-C   REFERRING PROVIDER: [name], MD  END OF SESSION:   PT End of Session - 07/09/22 1356     Visit Number 7    Number of Visits 12    Date for PT Re-Evaluation 07/29/22    Authorization Type UHC MCR    Progress Note Due on Visit 15   first one done visit #5   PT Start Time 1347    PT Stop Time 1430    PT Time Calculation (min) 43 min    Activity Tolerance Patient tolerated treatment well    Behavior During Therapy [facility] for tasks assessed/performed                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   Hyperlipidemia    Hypertension    Thyroid disease    Tobacco use    Past Surgical History:  Procedure Laterality Date   APPENDECTOMY  1976   CATARACT EXTRACTION     1997, 1998   CESAREAN SECTION  1983   CHOLECYSTECTOMY  1998   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   Procedure: LEFT TOTAL KNEE ARTHROPLASTY;  Surgeon: [name], MD;  Location: [facility];  Service: Orthopedics;  Laterality: Left;   TUBAL LIGATION  1994   Patient Active Problem List   Diagnosis Date Noted   OA (osteoarthritis) of knee 05/22/2022   Arthritis of knee 05/22/2022   Current every day smoker 01/19/2022   Difficulty sleeping 01/19/2022   Obesity, Class III, BMI 40-49.9 (morbid obesity) ([facility]) 11/19/2021   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   Acute CVA (cerebrovascular accident) ([facility]) 11/18/2021   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   Essential hypertension 11/16/2021   Hyperlipidemia 11/16/2021   Tobacco use 11/16/2021   Syncope and collapse 06/26/2021      THERAPY DIAG:  Acute pain of left knee  Muscle weakness (generalized)  Localized  edema  Other abnormalities of gait and mobility       REFERRING DIAG: R15.400 (ICD-10-CM) - S/P total knee arthroplasty, left 05/22/22   Rationale for Evaluation and Treatment Rehabilitation   ONSET DATE: 05/22/22   SUBJECTIVE:    SUBJECTIVE STATEMENT: Doing well, now driving herself. Denies pain.    PERTINENT HISTORY:PMH Lt TKA CVA,HTN, HLD, Hypothyroidism, vasovagal syncope, obesity     PAIN:  NPRS scale: 0/10 today upon arrival Pain location: left lateral knee Pain description: ache Aggravating factors: walking prolonged Relieving factors: stretching the knee out   PRECAUTIONS: None   WEIGHT BEARING RESTRICTIONS No   FALLS:  Has patient fallen in last 6 months? 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   PLOF: Independent   PATIENT GOALS reduce pain, return to work as Conservation officer, nature     OBJECTIVE:     PATIENT SURVEYS:  06/17/2022 Eval: FOTO 52% functional   COGNITION: 06/17/2022           Overall cognitive status: Within functional limits for tasks assessed                          SENSATION: 06/17/2022 [facility]   EDEMA:  06/17/2022 Moderate in left knee at eval  LOWER EXTREMITY ROM:    AROM/PROM Right 06/17/2022 Left 06/17/2022 Left 06/26/22 Left 06/30/22 Left 07/07/22  Hip flexion         Hip extension         Hip abduction         Hip adduction         Hip internal rotation         Hip external rotation         Knee flexion   93/105 AROM 115 in supine heel slide Seated P: 118* A: 113*   Knee extension   2/0  Seated LAQ A: -11* P: 0* Seated LAQ: -3*  Ankle dorsiflexion         Ankle plantarflexion         Ankle inversion         Ankle eversion          (Blank rows = not tested)   LOWER EXTREMITY MMT:   MMT Left 06/17/2022 Left 07/02/22  Hip flexion 3+   Hip extension     Hip abduction 3+   Hip adduction     Hip internal rotation     Hip external rotation     Knee flexion 4 4+  Knee extension 4 4+  Ankle dorsiflexion      Ankle plantarflexion     Ankle inversion     Ankle eversion      (Blank rows = not tested)   LOWER EXTREMITY SPECIAL TESTS:      FUNCTIONAL TESTS:  06/17/2022 5 times sit to stand: 12.93 seconds, uses UE to push up from lap   GAIT: 06/26/2022: Able to perform ambulation independent within clinic.   06/17/2022 Distance walked: 75 Assistive device utilized: RW Level of assistance: Modified independence Comments: able to walk 15 feet without AD with close supervision, does not use AD PLOF     TODAY'S TREATMENT:  07/09/22 Therex: Nu step L6 X 8 min UE/LE Seated Lt LAQ 4# 2X10 holding 3 sec Seated knee flexion AAROM stretch 5 sec X 10 Gastroc stretch 30 sec x 2 LLE on slantboard Step ups onto 6 inch step X 10 leading with left with one UE support Leg press DL 19#5 X 10, then left leg only 25# 2 X10 Seated SLR 2X10 Sit to stand no UE support X 10   07/07/22 TherEx Recumbent bike x 8 min; seat 3 (no resistance to L2) Lt LAQ 4# 3x10; 3 sec hold Seated Lt SLR x 10 reps with 3 sec hold Leg Press 50# 3x10; single leg 25# 2x10 performed bil      PATIENT EDUCATION: Education details: HEP, PT plan of care Person educated: Patient Education method: Explanation, Demonstration, Verbal cues, and Handouts Education comprehension: verbalized understanding and needs further education     HOME EXERCISE PROGRAM: Access Code: [email or web address] URL: [email or web address] Date: 06/17/2022 Prepared by: Ivery Quale   Exercises - Seated Straight Leg Raise with Quad Contraction  - 2 x daily - 6 x weekly - 2 sets - 10 reps - Sit to Stand Without Arm Support  - 2 x daily - 6 x weekly - 1 sets - 10 reps - Seated Knee Flexion Stretch  - 2 x daily - 6 x weekly - 1-2 sets - 10 reps - 5 sec hold   ASSESSMENT:   CLINICAL IMPRESSION:  She is doing well post op and had good tolerance to strength progressions today. PT recommending to continue with current PT plan.   OBJECTIVE  IMPAIRMENTS: decreased activity tolerance, difficulty walking, decreased balance, decreased endurance, decreased mobility, decreased ROM, decreased strength, impaired flexibility, impaired LE use, postural dysfunction, and pain.   ACTIVITY LIMITATIONS: bending, lifting, carry, locomotion, cleaning, community activity, driving, and or occupation   PERSONAL FACTORS:  PMH Lt TKA CVA,HTN, HLD, Hypothyroidism, vasovagal syncope, obesityare also affecting patient's functional outcome.   REHAB POTENTIAL: Good   CLINICAL DECISION MAKING: Stable/uncomplicated   EVALUATION COMPLEXITY: Low       GOALS: Short term PT Goals Target date: 07/15/2022 Pt will be I and compliant with HEP. Baseline:  Goal status: on going - assessed 06/26/2022 Pt will decrease pain by 25% overall Baseline: Goal status: on going - assessed 06/26/2022   Long term PT goals Target date: 07/29/2022 Pt will improve Lt knee AROM 0-110 to improve functional mobility Baseline: Goal status: New Pt will improve  hip/knee strength to at least 5-/5 MMT to improve functional strength Baseline: Goal status: New Pt will improve FOTO to at least 59% functional to show improved function Baseline: Goal status: New Pt will reduce pain to overall less than 2-3/10 with usual activity and work activity. Baseline: Goal status: New PLAN: PT FREQUENCY: 2 times per week    PT DURATION: 6 weeks   PLANNED INTERVENTIONS (unless contraindicated): aquatic PT, Canalith repositioning, cryotherapy, Electrical stimulation, Iontophoresis with 4 mg/ml dexamethasome, Moist heat, traction, Ultrasound, gait training, Therapeutic exercise, balance training, neuromuscular re-education, patient/family education, prosthetic training, manual techniques, passive ROM, dry needling, taping, vasopnuematic device, vestibular, spinal manipulations, joint manipulations   PLAN FOR NEXT SESSION:  Progressive strengthening, functional WB training, balance  improvements.     April Manson, PT, DPT 07/09/2022, 1:58 PM

## 2022-07-14 ENCOUNTER — Encounter: Payer: Self-pay | Admitting: Physical Therapy

## 2022-07-14 ENCOUNTER — Ambulatory Visit: Payer: Medicare Other | Admitting: Physical Therapy

## 2022-07-14 DIAGNOSIS — R600 Localized edema: Secondary | ICD-10-CM

## 2022-07-14 DIAGNOSIS — R2689 Other abnormalities of gait and mobility: Secondary | ICD-10-CM | POA: Diagnosis not present

## 2022-07-14 DIAGNOSIS — M6281 Muscle weakness (generalized): Secondary | ICD-10-CM | POA: Diagnosis not present

## 2022-07-14 DIAGNOSIS — M25562 Pain in left knee: Secondary | ICD-10-CM | POA: Diagnosis not present

## 2022-07-14 NOTE — Therapy (Signed)
OUTPATIENT PHYSICAL THERAPY TREATMENT NOTE   Patient Name: Veronica Weber MRN: [id number] DOB:[birth year], 66 y.o., female Today's Date: 07/14/2022  OHY:[id number], Randa Evens, PA-C   REFERRING PROVIDER: Meredith Pel, MD  END OF SESSION:   PT End of Session - 07/14/22 1445     Visit Number 8    Number of Visits 12    Date for PT Re-Evaluation 07/29/22    Authorization Type UHC MCR    Progress Note Due on Visit 15   first one done visit #5   PT Start Time 6948    PT Stop Time 1515    PT Time Calculation (min) 38 min    Activity Tolerance Patient tolerated treatment well    Behavior During Therapy [facility] for tasks assessed/performed                Past Medical History:  Diagnosis Date   Abortion    x3   Arthritis    Cataract    Emphysema/COPD ([place])    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   Hyperlipidemia    Hypertension    Thyroid disease    Tobacco use    Past Surgical History:  Procedure Laterality Date   [place]   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   Procedure: LEFT TOTAL KNEE ARTHROPLASTY;  Surgeon: Meredith Pel, MD;  Location: [place];  Service: Orthopedics;  Laterality: Left;   TUBAL LIGATION  1994   Patient Active Problem List   Diagnosis Date Noted   OA (osteoarthritis) of knee 05/22/2022   Arthritis of knee 05/22/2022   Current every day smoker 01/19/2022   Difficulty sleeping 01/19/2022   Obesity, Class III, BMI 40-49.9 (morbid obesity) ([place]) 11/19/2021   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   Acute CVA (cerebrovascular accident) ([place]) 11/18/2021   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   Essential hypertension 11/16/2021   Hyperlipidemia 11/16/2021   Tobacco use 11/16/2021   Syncope and collapse 06/26/2021      THERAPY DIAG:  Acute pain of left knee  Muscle weakness (generalized)  Localized  edema  Other abnormalities of gait and mobility       REFERRING DIAG: N46.270 (ICD-10-CM) - S/P total knee arthroplasty, left 05/22/22   Rationale for Evaluation and Treatment Rehabilitation   ONSET DATE: 05/22/22   SUBJECTIVE:    SUBJECTIVE STATEMENT: 2/10 pain overall in left knee, her Rt knee is in more pain now than left knee    PERTINENT HISTORY:PMH Lt TKA CVA,HTN, HLD, Hypothyroidism, vasovagal syncope, obesity     PAIN:  NPRS scale: 2/10 today upon arrival Pain location: left lateral knee Pain description: ache Aggravating factors: walking prolonged Relieving factors: stretching the knee out   PRECAUTIONS: None   WEIGHT BEARING RESTRICTIONS No   FALLS:  Has patient fallen in last 6 months? 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   PLOF: Independent   PATIENT GOALS reduce pain, return to work as Scientist, water quality     OBJECTIVE:     PATIENT SURVEYS:  06/17/2022 Eval: FOTO 52% functional   COGNITION: 06/17/2022           Overall cognitive status: Within functional limits for tasks assessed                          SENSATION: 06/17/2022 [facility]   EDEMA:  06/17/2022 Moderate in left knee at eval     LOWER EXTREMITY ROM:    AROM/PROM Right 06/17/2022 Left 06/17/2022 Left 06/26/22 Left 06/30/22 Left 07/07/22  Hip flexion         Hip extension         Hip abduction         Hip adduction         Hip internal rotation         Hip external rotation         Knee flexion   93/105 AROM 115 in supine heel slide Seated P: 118* A: 113*   Knee extension   2/0  Seated LAQ A: -11* P: 0* Seated LAQ: -3*  Ankle dorsiflexion         Ankle plantarflexion         Ankle inversion         Ankle eversion          (Blank rows = not tested)   LOWER EXTREMITY MMT:   MMT Left 06/17/2022 Left 07/02/22  Hip flexion 3+   Hip extension     Hip abduction 3+   Hip adduction     Hip internal rotation     Hip external rotation     Knee flexion 4 4+  Knee extension 4  4+  Ankle dorsiflexion     Ankle plantarflexion     Ankle inversion     Ankle eversion      (Blank rows = not tested)   LOWER EXTREMITY SPECIAL TESTS:      FUNCTIONAL TESTS:  06/17/2022 5 times sit to stand: 12.93 seconds, uses UE to push up from lap   GAIT: 06/26/2022: Able to perform ambulation independent within clinic.   06/17/2022 Distance walked: 75 Assistive device utilized: RW Level of assistance: Modified independence Comments: able to walk 15 feet without AD with close supervision, does not use AD PLOF     TODAY'S TREATMENT:  07/14/22 Therex: Nu step L6 X 8.5 min UE/LE Seated knee extension machine DL 5# 2X10 Seated hamstring curl machine DL 20# 2X10 Seated knee flexion AAROM stretch 5 sec X 10 Gastroc stretch 30 sec x 2 LLE on slantboard Step ups onto 6 inch step fwd and lateral X 10 leading with left with one UE support Leg press DL 68 #2 X 10, then left leg only 31# 2 X10 Seated SLR 2X10 Sit to stand no UE support X 10  07/09/22 Therex: Nu step L6 X 8 min UE/LE Seated Lt LAQ 4# 2X10 holding 3 sec Seated knee flexion AAROM stretch 5 sec X 10 Gastroc stretch 30 sec x 2 LLE on slantboard Step ups onto 6 inch step X 10 leading with left with one UE support Leg press DL 62#2 X 10, then left leg only 25# 2 X10 Seated SLR 2X10 Sit to stand no UE support X 10      PATIENT EDUCATION: Education details: HEP, PT plan of care Person educated: Patient Education method: Explanation, Demonstration, Verbal cues, and Handouts Education comprehension: verbalized understanding and needs further education     HOME EXERCISE PROGRAM: Access Code: [email or web address] URL: [email or web address] Date: 06/17/2022 Prepared by: Elsie Ra   Exercises - Seated Straight Leg Raise with Quad Contraction  - 2 x daily - 6 x weekly - 2 sets - 10 reps - Sit to Stand Without Arm Support  - 2 x daily - 6 x weekly - 1 sets - 10 reps - Seated Knee  Flexion Stretch  - 2 x  daily - 6 x weekly - 1-2 sets - 10 reps - 5 sec hold   ASSESSMENT:   CLINICAL IMPRESSION:  Progressed strength program with good overall tolerance but did have some pain with lateral step ups.  She has progressed her ambulation to no longer needing SPC. She has now met her STG's.   OBJECTIVE IMPAIRMENTS: decreased activity tolerance, difficulty walking, decreased balance, decreased endurance, decreased mobility, decreased ROM, decreased strength, impaired flexibility, impaired LE use, postural dysfunction, and pain.   ACTIVITY LIMITATIONS: bending, lifting, carry, locomotion, cleaning, community activity, driving, and or occupation   PERSONAL FACTORS:  PMH Lt TKA CVA,HTN, HLD, Hypothyroidism, vasovagal syncope, obesityare also affecting patient's functional outcome.   REHAB POTENTIAL: Good   CLINICAL DECISION MAKING: Stable/uncomplicated   EVALUATION COMPLEXITY: Low       GOALS: Short term PT Goals Target date: 07/15/2022 Pt will be I and compliant with HEP. Baseline:  Goal status: MET 07/14/22 Pt will decrease pain by 25% overall Baseline: Goal status: MET 07/14/22   Long term PT goals Target date: 07/29/2022 Pt will improve Lt knee AROM 0-110 to improve functional mobility Baseline: Goal status: ongoing Pt will improve  hip/knee strength to at least 5-/5 MMT to improve functional strength Baseline: Goal status: ongoing Pt will improve FOTO to at least 59% functional to show improved function Baseline: Goal status: ongoing Pt will reduce pain to overall less than 2-3/10 with usual activity and work activity. Baseline: Goal status: ongoing PLAN: PT FREQUENCY: 2 times per week    PT DURATION: 6 weeks   PLANNED INTERVENTIONS (unless contraindicated): aquatic PT, Canalith repositioning, cryotherapy, Electrical stimulation, Iontophoresis with 4 mg/ml dexamethasome, Moist heat, traction, Ultrasound, gait training, Therapeutic exercise, balance training, neuromuscular  re-education, patient/family education, prosthetic training, manual techniques, passive ROM, dry needling, taping, vasopnuematic device, vestibular, spinal manipulations, joint manipulations   PLAN FOR NEXT SESSION:  Progressive strengthening, functional WB training, balance improvements.     Debbe Odea, PT, DPT 07/14/2022, 2:46 PM

## 2022-07-16 ENCOUNTER — Ambulatory Visit (INDEPENDENT_AMBULATORY_CARE_PROVIDER_SITE_OTHER): Payer: Medicare Other | Admitting: Physical Therapy

## 2022-07-16 ENCOUNTER — Encounter: Payer: Self-pay | Admitting: Physical Therapy

## 2022-07-16 DIAGNOSIS — M25562 Pain in left knee: Secondary | ICD-10-CM

## 2022-07-16 DIAGNOSIS — R600 Localized edema: Secondary | ICD-10-CM

## 2022-07-16 DIAGNOSIS — R2689 Other abnormalities of gait and mobility: Secondary | ICD-10-CM | POA: Diagnosis not present

## 2022-07-16 DIAGNOSIS — M6281 Muscle weakness (generalized): Secondary | ICD-10-CM | POA: Diagnosis not present

## 2022-07-16 NOTE — Progress Notes (Unsigned)
Synopsis: Referred for lung nodules by [place], Randa Evens, PA-C  Subjective:   PATIENT ID: Veronica Weber GENDER: female DOB: 12/06/1955, MRN: [id number]  No chief complaint on file.   66yF with history of smoking, thyroid disease who is referred for nodular opacities on lung cancer screening CT Chest.  Quit smoking when she was pregnant with children - cold [place]. Tried patches a long time ago, has never tried chantix.   She doesn't think she has increased DOE relative to peers who are healthy and don't smoke. Has never had course of steroids for breathing/cough. Thinks she's getting over a bout of bronchitis, still has a productive cough in AM and at night.  No direct family history of lung disease.   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 She lived in [place] for 13.5 years, moved back to [place] in 1992. No recent travel. She has 2 dogs.  Interval HPI: Last seen 08/2021. Chantix ordered. CT Chest with similar LLL nodular consolidation.   PFTs   Otherwise pertinent review of systems is negative.  Past Medical History:  Diagnosis Date   Abortion    x3   Arthritis    Cataract    Emphysema/COPD ([place])    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   Hyperlipidemia    Hypertension    Thyroid disease    Tobacco use      Family History  Problem Relation Age of Onset   Arthritis Mother    Cancer Maternal Grandmother    Diabetes Maternal Grandmother    Diabetes Maternal Grandfather    Healthy Daughter    Healthy Son      Past Surgical History:  Procedure Laterality Date   APPENDECTOMY  1976   CATARACT EXTRACTION     1997, [place] ARTHROPLASTY Left 05/22/2022   Procedure: LEFT TOTAL KNEE ARTHROPLASTY;  Surgeon: Meredith Pel, MD;  Location: [place];  Service: Orthopedics;  Laterality: Left;   TUBAL LIGATION  1994    Social History   Socioeconomic History   Marital status: Divorced    Spouse  name: Not on file   Number of children: 2   Years of education: Not on file   Highest education level: Some college, no degree  Occupational History   Not on file  Tobacco Use   Smoking status: Every Day    Packs/day: 0.25    Years: 45.00    Total pack years: 11.25    Types: Cigarettes    Start date: 06/01/1976   Smokeless tobacco: Never  Vaping Use   Vaping Use: Never used  Substance and Sexual Activity   Alcohol use: Not Currently   Drug use: Never   Sexual activity: Not Currently  Other Topics Concern   Not on file  Social History Narrative   Lives alone   Caffeine- coffee 1 -2 c daily   Social Determinants of Health   Financial Resource Strain: Low Risk  (02/19/2022)   Overall Financial Resource Strain (CARDIA)    Difficulty of Paying Living Expenses: Not very hard  Food Insecurity: No Food Insecurity (02/19/2022)   Hunger Vital Sign    Worried About Running Out of Food in the Last Year: Never true    Ran Out of Food in the Last Year: Never true  Transportation Needs: Not on file  Physical Activity: Not on file  Stress: Not on file  Social Connections:  Not on file  Intimate Partner Violence: Not on file     Not on File   Outpatient Medications Prior to Visit  Medication Sig Dispense Refill   aspirin 81 MG chewable tablet Chew 1 tablet (81 mg total) by mouth 2 (two) times daily. 60 tablet 0   atorvastatin (LIPITOR) 80 MG tablet TAKE ONE TABLET BY MOUTH EVERY MORNING 90 tablet 1   chlorthalidone (HYGROTON) 25 MG tablet Take 1 tablet (25 mg total) by mouth daily. Take 1/2 tablet by mouth daily. (Patient taking differently: Take 12.5 mg by mouth daily.) 90 tablet 0   docusate sodium (COLACE) 100 MG capsule Take 1 capsule (100 mg total) by mouth 2 (two) times daily. 10 capsule 0   gabapentin (NEURONTIN) 300 MG capsule TAKE 1 TO 2 CAPSULES BY MOUTH EVERYDAY AT BEDTIME 90 capsule 1   HYDROcodone-acetaminophen (NORCO/VICODIN) 5-325 MG tablet Take 1 tablet by mouth at  bedtime as needed for moderate pain. 30 tablet 0   losartan (COZAAR) 50 MG tablet TAKE 1 TABLET BY MOUTH DAILY 100 tablet 2   methocarbamol (ROBAXIN) 500 MG tablet Take 1 tablet (500 mg total) by mouth every 6 (six) hours as needed for muscle spasms. 30 tablet 0   Multiple Vitamins-Minerals (MULTIVITAMIN WITH MINERALS) tablet Take 1 tablet by mouth daily.     naproxen (NAPROSYN) 250 MG tablet Take 1 tablet (250 mg total) by mouth 2 (two) times daily with a meal. 60 tablet 0   nicotine (NICODERM CQ - DOSED IN MG/24 HOURS) 14 mg/24hr patch Place 1 patch (14 mg total) onto the skin daily. 28 patch 0   ondansetron (ZOFRAN) 4 MG tablet Take 1 tablet (4 mg total) by mouth every 6 (six) hours as needed for nausea. 20 tablet 0   No facility-administered medications prior to visit.       Objective:   Physical Exam:  General appearance: 66 y.o., female, NAD, conversant  Eyes: anicteric sclerae, moist conjunctivae; no lid-lag; PERRL, tracking appropriately HENT: NCAT; oropharynx, MMM, no mucosal ulcerations; normal hard and soft palate Neck: Trachea midline; no lymphadenopathy, no JVD Lungs: CTAB, no crackles, no wheeze, with normal respiratory effort CV: RRR, no MRGs  Abdomen: Soft, non-tender; non-distended, BS present  Extremities: No peripheral edema, radial and DP pulses present bilaterally  Skin: Normal temperature, turgor and texture; no rash Psych: Appropriate affect Neuro: Alert and oriented to person and place, no focal deficit    There were no vitals filed for this visit.    on  RA BMI Readings from Last 3 Encounters:  05/22/22 37.20 kg/m  05/15/22 37.41 kg/m  05/13/22 41.09 kg/m   Wt Readings from Last 3 Encounters:  05/22/22 210 lb (95.3 kg)  05/15/22 211 lb 3.2 oz (95.8 kg)  05/13/22 210 lb 6.4 oz (95.4 kg)     CBC    Component Value Date/Time   WBC 8.0 05/24/2022 1243   RBC 3.29 (L) 05/24/2022 1243   HGB 10.7 (L) 05/24/2022 1243   HCT 31.4 (L) 05/24/2022  1243   PLT 233 05/24/2022 1243   MCV 95.4 05/24/2022 1243   MCH 32.5 05/24/2022 1243   MCHC 34.1 05/24/2022 1243   RDW 12.7 05/24/2022 1243   LYMPHSABS 3.3 05/06/2022 0913   MONOABS 0.7 05/06/2022 0913   EOSABS 0.3 05/06/2022 0913   BASOSABS 0.1 05/06/2022 0913    Eos 200  Chest Imaging: CT Chest 06/28/21 reviewed by me with bronchial wall thickening, nodular foci posterior basal LLL and another area  of possible scar in anterior lingula. No suspicous LAD.  CT Chest 10/2021 with similar nodular consolidation LLL, stable nodules otherwise, bronchial wall thickening, subtle emphysema  Pulmonary Functions Testing Results:     No data to display           Echocardiogram:   TTE 11/17/21  1. Left ventricular ejection fraction, by estimation, is 60 to 65%. The  left ventricle has normal function. The left ventricle has no regional  wall motion abnormalities. Left ventricular diastolic parameters are  consistent with Grade I diastolic  dysfunction (impaired relaxation).   2. Right ventricular systolic function is normal. The right ventricular  size is normal.   3. The mitral valve is normal in structure. No evidence of mitral valve  regurgitation. No evidence of mitral stenosis.   4. The aortic valve is normal in structure. Aortic valve regurgitation is  not visualized. No aortic stenosis is present.   5. The inferior vena cava is normal in size with greater than 50%  respiratory variability, suggesting right atrial pressure of 3 mmHg.       Assessment & Plan:   # 68mm ?LLL nodule: posterior basal LLL and another area of possible scar in anterior lingula. No suspicous LAD. 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  # Smoking # ?Emphysema - very subtle on imagine # Bronchial wall thickening  Plan: - CT chest 3 months - PFTs on same day as next clinic appointment - smoking cessation strongly encouraged. We will try chantix.     Maryjane Hurter, MD [facility] 07/16/2022 9:20 AM

## 2022-07-16 NOTE — Therapy (Signed)
OUTPATIENT PHYSICAL THERAPY TREATMENT NOTE   Patient Name: Veronica Weber MRN: [id number] DOB:09/10/1955, 66 y.o., female Today's Date: 07/16/2022  PRF:[id number], Randa Evens, PA-C   REFERRING PROVIDER: Meredith Pel, MD  END OF SESSION:   PT End of Session - 07/16/22 1427     Visit Number 9    Number of Visits 12    Date for PT Re-Evaluation 07/29/22    Authorization Type UHC MCR    Progress Note Due on Visit 15   first one done visit #5   PT Start Time 1425    PT Stop Time 1503    PT Time Calculation (min) 38 min    Activity Tolerance Patient tolerated treatment well    Behavior During Therapy [facility] for tasks assessed/performed                Past Medical History:  Diagnosis Date   Abortion    x3   Arthritis    Cataract    Emphysema/COPD ([place])    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   Hyperlipidemia    Hypertension    Thyroid disease    Tobacco use    Past Surgical History:  Procedure Laterality Date   [place] ARTHROPLASTY Left 05/22/2022   Procedure: LEFT TOTAL KNEE ARTHROPLASTY;  Surgeon: Meredith Pel, MD;  Location: [place];  Service: Orthopedics;  Laterality: Left;   TUBAL LIGATION  1994   Patient Active Problem List   Diagnosis Date Noted   OA (osteoarthritis) of knee 05/22/2022   Arthritis of knee 05/22/2022   Current every day smoker 01/19/2022   Difficulty sleeping 01/19/2022   Obesity, Class III, BMI 40-49.9 (morbid obesity) ([place]) 11/19/2021   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   Acute CVA (cerebrovascular accident) ([place]) 11/18/2021   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   Essential hypertension 11/16/2021   Hyperlipidemia 11/16/2021   Tobacco use 11/16/2021   Syncope and collapse 06/26/2021      THERAPY DIAG:  Acute pain of left knee  Muscle weakness (generalized)  Localized  edema  Other abnormalities of gait and mobility       REFERRING DIAG: L93.570 (ICD-10-CM) - S/P total knee arthroplasty, left 05/22/22   Rationale for Evaluation and Treatment Rehabilitation   ONSET DATE: 05/22/22   SUBJECTIVE:    SUBJECTIVE STATEMENT: 3/10 pain today in her knee    PERTINENT HISTORY:PMH Lt TKA CVA,HTN, HLD, Hypothyroidism, vasovagal syncope, obesity     PAIN:  NPRS scale: 2/10 today upon arrival Pain location: left lateral knee Pain description: ache Aggravating factors: walking prolonged Relieving factors: stretching the knee out   PRECAUTIONS: None   WEIGHT BEARING RESTRICTIONS No   FALLS:  Has patient fallen in last 6 months? 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   EDEMA:  06/17/2022 Moderate in left knee at eval  LOWER EXTREMITY ROM:    AROM/PROM Right 06/17/2022 Left 06/17/2022 Left 06/26/22 Left 06/30/22 Left 07/07/22  Hip flexion         Hip extension         Hip abduction         Hip adduction         Hip internal rotation         Hip external rotation         Knee flexion   93/105 AROM 115 in supine heel slide Seated P: 118* A: 113*   Knee extension   2/0  Seated LAQ A: -11* P: 0* Seated LAQ: -3*  Ankle dorsiflexion         Ankle plantarflexion         Ankle inversion         Ankle eversion          (Blank rows = not tested)   LOWER EXTREMITY MMT:   MMT Left 06/17/2022 Left 07/02/22 Left 07/16/22  Hip flexion 3+    Hip extension      Hip abduction 3+    Hip adduction      Hip internal rotation      Hip external rotation      Knee flexion 4 4+ 5-  Knee extension 4 4+ 5-  Ankle dorsiflexion       Ankle plantarflexion      Ankle inversion      Ankle eversion       (Blank rows = not tested)   LOWER EXTREMITY SPECIAL TESTS:      FUNCTIONAL TESTS:  06/17/2022 5 times sit to stand: 12.93 seconds, uses UE to push up from lap   GAIT: 06/26/2022: Able to perform ambulation independent within clinic.   06/17/2022 Distance walked: 75 Assistive device utilized: RW Level of assistance: Modified independence Comments: able to walk 15 feet without AD with close supervision, does not use AD PLOF     TODAY'S TREATMENT:  07/16/22 Therex: Nu step L6-5 X 9 min LE only Seated knee extension machine DL 5# 2X12 Seated hamstring curl machine DL 20# 2X12 Seated knee flexion AAROM stretch 5 sec X 10 Gastroc stretch 30 sec x 3 LLE on slantboard Step ups onto 6 inch step fwd and lateral X 10 leading with left with one UE support Leg press DL 75#2 X 10, then left leg only 37# 2 X10 Seated SLR 2X10 Sit to stand no UE support X 10  07/14/22 Therex: Nu step L6 X 8.5 min UE/LE Seated knee extension machine DL 5# 2X10 Seated hamstring curl machine DL 20# 2X10 Seated knee flexion AAROM stretch 5 sec X 10 Gastroc stretch 30 sec x 2 LLE on slantboard Step ups onto 6 inch step fwd and lateral X 10 leading with left with one UE support Leg press DL 68 #2 X 10, then left leg only 31# 2 X10 Seated SLR 2X10 Sit to stand no UE support X 10  07/09/22 Therex: Nu step L6 X 8 min UE/LE Seated Lt LAQ 4# 2X10 holding 3 sec Seated knee flexion AAROM stretch 5 sec X 10 Gastroc stretch 30 sec x 2 LLE on slantboard Step ups onto 6 inch step X 10 leading with left with one UE support Leg press DL 62#2 X 10, then left leg only 25# 2 X10 Seated SLR 2X10 Sit to stand no UE support X 10      PATIENT EDUCATION: Education details: HEP, PT plan of care  Person educated: Patient Education method: Explanation, Demonstration, Verbal cues, and Handouts Education comprehension: verbalized understanding  and needs further education     HOME EXERCISE PROGRAM: Access Code: [email or web address] URL: [email or web address] Date: 06/17/2022 Prepared by: Elsie Ra   Exercises - Seated Straight Leg Raise with Quad Contraction  - 2 x daily - 6 x weekly - 2 sets - 10 reps - Sit to Stand Without Arm Support  - 2 x daily - 6 x weekly - 1 sets - 10 reps - Seated Knee Flexion Stretch  - 2 x daily - 6 x weekly - 1-2 sets - 10 reps - 5 sec hold   ASSESSMENT:   CLINICAL IMPRESSION:  Knee ROM doing well, strength is improving and able to perform more weight or reps on her strengthening exercises today. Continued current PT plan.   OBJECTIVE IMPAIRMENTS: decreased activity tolerance, difficulty walking, decreased balance, decreased endurance, decreased mobility, decreased ROM, decreased strength, impaired flexibility, impaired LE use, postural dysfunction, and pain.   ACTIVITY LIMITATIONS: bending, lifting, carry, locomotion, cleaning, community activity, driving, and or occupation   PERSONAL FACTORS:  PMH Lt TKA CVA,HTN, HLD, Hypothyroidism, vasovagal syncope, obesityare also affecting patient's functional outcome.   REHAB POTENTIAL: Good   CLINICAL DECISION MAKING: Stable/uncomplicated   EVALUATION COMPLEXITY: Low       GOALS: Short term PT Goals Target date: 07/15/2022 Pt will be I and compliant with HEP. Baseline:  Goal status: MET 07/14/22 Pt will decrease pain by 25% overall Baseline: Goal status: MET 07/14/22   Long term PT goals Target date: 07/29/2022 Pt will improve Lt knee AROM 0-110 to improve functional mobility Baseline: Goal status: ongoing Pt will improve  hip/knee strength to at least 5-/5 MMT to improve functional strength Baseline: Goal status: ongoing Pt will improve FOTO to at least 59% functional to show improved function Baseline: Goal status: ongoing Pt will reduce pain to overall less than 2-3/10 with usual activity and work activity. Baseline: Goal  status: ongoing PLAN: PT FREQUENCY: 2 times per week    PT DURATION: 6 weeks   PLANNED INTERVENTIONS (unless contraindicated): aquatic PT, Canalith repositioning, cryotherapy, Electrical stimulation, Iontophoresis with 4 mg/ml dexamethasome, Moist heat, traction, Ultrasound, gait training, Therapeutic exercise, balance training, neuromuscular re-education, patient/family education, prosthetic training, manual techniques, passive ROM, dry needling, taping, vasopnuematic device, vestibular, spinal manipulations, joint manipulations   PLAN FOR NEXT SESSION:  Progressive strengthening, functional WB training, balance improvements.     Debbe Odea, PT, DPT 07/16/2022, 2:28 PM

## 2022-07-17 ENCOUNTER — Encounter: Payer: Self-pay | Admitting: Student

## 2022-07-17 ENCOUNTER — Ambulatory Visit (INDEPENDENT_AMBULATORY_CARE_PROVIDER_SITE_OTHER): Payer: Medicare Other | Admitting: Student

## 2022-07-17 ENCOUNTER — Ambulatory Visit: Payer: Medicare Other | Admitting: Student

## 2022-07-17 VITALS — BP 112/60 | HR 90 | Temp 98.1°F | Ht 61.0 in | Wt 217.8 lb

## 2022-07-17 DIAGNOSIS — F17200 Nicotine dependence, unspecified, uncomplicated: Secondary | ICD-10-CM

## 2022-07-17 DIAGNOSIS — R918 Other nonspecific abnormal finding of lung field: Secondary | ICD-10-CM

## 2022-07-17 LAB — PULMONARY FUNCTION TEST
DL/VA % pred: 108 %
DL/VA: 4.64 ml/min/mmHg/L
DLCO cor % pred: 95 %
DLCO cor: 17.06 ml/min/mmHg
DLCO unc % pred: 95 %
DLCO unc: 17.06 ml/min/mmHg
FEF 25-75 Post: 1.88 L/sec
FEF 25-75 Pre: 1.55 L/sec
FEF2575-%Change-Post: 21 %
FEF2575-%Pred-Post: 98 %
FEF2575-%Pred-Pre: 80 %
FEV1-%Change-Post: 5 %
FEV1-%Pred-Post: 75 %
FEV1-%Pred-Pre: 72 %
FEV1-Post: 1.6 L
FEV1-Pre: 1.52 L
FEV1FVC-%Change-Post: 0 %
FEV1FVC-%Pred-Pre: 107 %
FEV6-%Change-Post: 5 %
FEV6-%Pred-Post: 73 %
FEV6-%Pred-Pre: 69 %
FEV6-Post: 1.94 L
FEV6-Pre: 1.85 L
FEV6FVC-%Change-Post: 0 %
FEV6FVC-%Pred-Post: 104 %
FEV6FVC-%Pred-Pre: 104 %
FVC-%Change-Post: 5 %
FVC-%Pred-Post: 70 %
FVC-%Pred-Pre: 66 %
FVC-Post: 1.94 L
FVC-Pre: 1.85 L
Post FEV1/FVC ratio: 82 %
Post FEV6/FVC ratio: 100 %
Pre FEV1/FVC ratio: 82 %
Pre FEV6/FVC Ratio: 100 %
RV % pred: 82 %
RV: 1.61 L
TLC % pred: 68 %
TLC: 3.15 L

## 2022-07-17 NOTE — Patient Instructions (Signed)
-   You will be called to schedule CT chest  - We'll set up clinic follow up in 3-4 weeks to review results

## 2022-07-17 NOTE — Patient Instructions (Signed)
Full PFT Performed Today  

## 2022-07-17 NOTE — Progress Notes (Signed)
Full PFT Performed Today  

## 2022-07-21 ENCOUNTER — Encounter: Payer: Self-pay | Admitting: Physical Therapy

## 2022-07-21 ENCOUNTER — Ambulatory Visit (INDEPENDENT_AMBULATORY_CARE_PROVIDER_SITE_OTHER): Payer: Medicare Other | Admitting: Physical Therapy

## 2022-07-21 DIAGNOSIS — R600 Localized edema: Secondary | ICD-10-CM | POA: Diagnosis not present

## 2022-07-21 DIAGNOSIS — M6281 Muscle weakness (generalized): Secondary | ICD-10-CM

## 2022-07-21 DIAGNOSIS — M25562 Pain in left knee: Secondary | ICD-10-CM | POA: Diagnosis not present

## 2022-07-21 DIAGNOSIS — R2689 Other abnormalities of gait and mobility: Secondary | ICD-10-CM | POA: Diagnosis not present

## 2022-07-21 NOTE — Therapy (Signed)
OUTPATIENT PHYSICAL THERAPY TREATMENT NOTE   Patient Name: Veronica Weber MRN: [id number] DOB:08-Dec-1955, 66 y.o., female Today's Date: 07/21/2022  IRW:[id number], Randa Evens, PA-C   REFERRING PROVIDER: Meredith Pel, MD  END OF SESSION:   PT End of Session - 07/21/22 1440     Visit Number 10    Number of Visits 12    Date for PT Re-Evaluation 07/29/22    Authorization Type U[employer] MCR    Progress Note Due on Visit 15   first one done visit #5   PT Start Time 1430    PT Stop Time 1508    PT Time Calculation (min) 38 min    Activity Tolerance Patient tolerated treatment well    Behavior During Therapy [facility] for tasks assessed/performed                Past Medical History:  Diagnosis Date   Abortion    x3   Arthritis    Cataract    Emphysema/COPD ([place])    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   Hyperlipidemia    Hypertension    Thyroid disease    Tobacco use    Past Surgical History:  Procedure Laterality Date   [place]   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   Procedure: LEFT TOTAL KNEE ARTHROPLASTY;  Surgeon: Meredith Pel, MD;  Location: [place];  Service: Orthopedics;  Laterality: Left;   TUBAL LIGATION  1994   Patient Active Problem List   Diagnosis Date Noted   OA (osteoarthritis) of knee 05/22/2022   Arthritis of knee 05/22/2022   Current every day smoker 01/19/2022   Difficulty sleeping 01/19/2022   Obesity, Class III, BMI 40-49.9 (morbid obesity) ([place]) 11/19/2021   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     PERTINENT HISTORY:PMH Lt TKA CVA,HTN, HLD, Hypothyroidism, vasovagal syncope, obesity     PAIN:  NPRS scale: 2/10 today upon arrival Pain location: left lateral knee Pain description: ache Aggravating factors: walking prolonged Relieving factors: stretching the knee out   PRECAUTIONS: None   WEIGHT BEARING RESTRICTIONS No   FALLS:  Has patient fallen in last 6 months? 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   PLOF: Independent   PATIENT GOALS reduce pain, return to work as Scientist, water quality     OBJECTIVE:     PATIENT SURVEYS:  06/17/2022 Eval: FOTO 52% functional   COGNITION: 06/17/2022           Overall cognitive status: Within functional limits for tasks assessed                          SENSATION: 06/17/2022 [facility]   EDEMA:  06/17/2022 Moderate in left knee at eval  LOWER EXTREMITY ROM:    AROM/PROM Right 06/17/2022 Left 06/17/2022 Left 06/26/22 Left 06/30/22 Left 07/07/22  Hip flexion         Hip extension         Hip abduction         Hip adduction         Hip internal rotation         Hip external rotation         Knee flexion   93/105 AROM 115 in supine heel slide Seated P: 118* A: 113*   Knee extension   2/0  Seated LAQ A: -11* P: 0* Seated LAQ: -3*  Ankle dorsiflexion         Ankle plantarflexion         Ankle inversion         Ankle eversion          (Blank rows = not tested)   LOWER EXTREMITY MMT:   MMT Left 06/17/2022 Left 07/02/22 Left 07/16/22 Left 07/21/22  Hip flexion 3+     Hip extension       Hip abduction 3+     Hip adduction       Hip internal rotation       Hip external rotation       Knee flexion 4 4+ 5- 5-  Knee  extension 4 4+ 5- 5-  Ankle dorsiflexion       Ankle plantarflexion       Ankle inversion       Ankle eversion        (Blank rows = not tested)   LOWER EXTREMITY SPECIAL TESTS:      FUNCTIONAL TESTS:  06/17/2022 5 times sit to stand: 12.93 seconds, uses UE to push up from lap   GAIT: 06/26/2022: Able to perform ambulation independent within clinic.   06/17/2022 Distance walked: 75 Assistive device utilized: RW Level of assistance: Modified independence Comments: able to walk 15 feet without AD with close supervision, does not use AD PLOF     TODAY'S TREATMENT:  07/21/22 Therex: Nu step L6-5 X 9 min LE only Seated knee extension machine DL 5# 3 X 10 Seated hamstring curl machine DL 20# 3X10 Seated knee flexion AAROM stretch 5 sec X 10 Gastroc stretch 30 sec x 3 LLE on slantboard Step ups onto 6 inch step fwd 2 X 10 leading with left with one UE support Leg press DL 75# 3 X 10, then left leg only 37# 3 X10 Seated SLR 2X10 Sit to stand no UE support X 10     PATIENT EDUCATION: Education details: HEP, PT plan of care Person educated: Patient Education method: Explanation, Demonstration, Verbal cues, and Handouts Education comprehension: verbalized understanding and needs further education     HOME EXERCISE PROGRAM: Access Code: [email or web address] URL: [email or web address] Date: 06/17/2022 Prepared by: Elsie Ra   Exercises - Seated Straight Leg Raise with Quad Contraction  - 2 x daily - 6 x weekly - 2 sets - 10 reps - Sit to Stand Without Arm Support  - 2 x daily - 6 x weekly - 1 sets - 10 reps - Seated Knee Flexion Stretch  - 2 x daily - 6 x weekly - 1-2 sets - 10 reps - 5 sec hold   ASSESSMENT:   CLINICAL IMPRESSION:  Knee pain and ROM doing well. She had some back pain today of note. She has 2 more visits left and we will work to transition her to  independent program over last 2 visits.    OBJECTIVE IMPAIRMENTS: decreased activity tolerance,  difficulty walking, decreased balance, decreased endurance, decreased mobility, decreased ROM, decreased strength, impaired flexibility, impaired LE use, postural dysfunction, and pain.   ACTIVITY LIMITATIONS: bending, lifting, carry, locomotion, cleaning, community activity, driving, and or occupation   PERSONAL FACTORS:  PMH Lt TKA CVA,HTN, HLD, Hypothyroidism, vasovagal syncope, obesityare also affecting patient's functional outcome.   REHAB POTENTIAL: Good   CLINICAL DECISION MAKING: Stable/uncomplicated   EVALUATION COMPLEXITY: Low       GOALS: Short term PT Goals Target date: 07/15/2022 Pt will be I and compliant with HEP. Baseline:  Goal status: MET 07/14/22 Pt will decrease pain by 25% overall Baseline: Goal status: MET 07/14/22   Long term PT goals Target date: 07/29/2022 Pt will improve Lt knee AROM 0-110 to improve functional mobility Baseline: Goal status: ongoing Pt will improve  hip/knee strength to at least 5-/5 MMT to improve functional strength Baseline: Goal status: ongoing Pt will improve FOTO to at least 59% functional to show improved function Baseline: Goal status: ongoing Pt will reduce pain to overall less than 2-3/10 with usual activity and work activity. Baseline: Goal status: ongoing PLAN: PT FREQUENCY: 2 times per week    PT DURATION: 6 weeks   PLANNED INTERVENTIONS (unless contraindicated): aquatic PT, Canalith repositioning, cryotherapy, Electrical stimulation, Iontophoresis with 4 mg/ml dexamethasome, Moist heat, traction, Ultrasound, gait training, Therapeutic exercise, balance training, neuromuscular re-education, patient/family education, prosthetic training, manual techniques, passive ROM, dry needling, taping, vasopnuematic device, vestibular, spinal manipulations, joint manipulations   PLAN FOR NEXT SESSION:  transition to independent program over remaining 2 visits   Debbe Odea, PT, DPT 07/21/2022, 2:40 PM

## 2022-07-23 ENCOUNTER — Encounter: Payer: Medicare Other | Admitting: Physical Therapy

## 2022-07-28 ENCOUNTER — Ambulatory Visit: Payer: Medicare Other | Admitting: Physician Assistant

## 2022-07-29 ENCOUNTER — Ambulatory Visit: Payer: Medicare Other | Admitting: Physical Therapy

## 2022-07-29 ENCOUNTER — Encounter: Payer: Self-pay | Admitting: Physical Therapy

## 2022-07-29 DIAGNOSIS — R600 Localized edema: Secondary | ICD-10-CM | POA: Diagnosis not present

## 2022-07-29 DIAGNOSIS — M6281 Muscle weakness (generalized): Secondary | ICD-10-CM

## 2022-07-29 DIAGNOSIS — R2689 Other abnormalities of gait and mobility: Secondary | ICD-10-CM | POA: Diagnosis not present

## 2022-07-29 DIAGNOSIS — M25562 Pain in left knee: Secondary | ICD-10-CM | POA: Diagnosis not present

## 2022-07-29 NOTE — Therapy (Signed)
OUTPATIENT PHYSICAL THERAPY TREATMENT NOTE/Discharge PHYSICAL THERAPY DISCHARGE SUMMARY  Visits from Start of Care: 11  Current functional level related to goals / functional outcomes: See below   Remaining deficits: See below   Education / Equipment: HEP  Plan:  Patient goals were  met. Patient is being discharged due to meeting her PT goals      Patient Name: Veronica Weber MRN: [id number] DOB:08/07/1956, 66 y.o., female Today's Date: 07/29/2022  PCP:Allwardt, Alyssa M, PA-C   REFERRING PROVIDER: Dean, Gregory Scott, MD  END OF SESSION:   PT End of Session - 07/29/22 1351     Visit Number 11    Number of Visits 12    Date for PT Re-Evaluation 07/29/22    Authorization Type UHC MCR    Progress Note Due on Visit 15   first one done visit #5   PT Start Time 1347    PT Stop Time 1425    PT Time Calculation (min) 38 min    Activity Tolerance Patient tolerated treatment well    Behavior During Therapy WFL for tasks assessed/performed                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   Hyperlipidemia    Hypertension    Thyroid disease    Tobacco use    Past Surgical History:  Procedure Laterality Date   APPENDECTOMY  1976   CATARACT EXTRACTION     1997, 1998   CESAREAN SECTION  1983   CHOLECYSTECTOMY  1998   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   Procedure: LEFT TOTAL KNEE ARTHROPLASTY;  Surgeon: Dean, Gregory Scott, MD;  Location: [facility];  Service: Orthopedics;  Laterality: Left;   TUBAL LIGATION  1994   Patient Active Problem List   Diagnosis Date Noted   OA (osteoarthritis) of knee 05/22/2022   Arthritis of knee 05/22/2022   Current every day smoker 01/19/2022   Difficulty sleeping 01/19/2022   Obesity, Class III, BMI 40-49.9 (morbid obesity) ([facility]) 11/19/2021   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    Acute CVA (cerebrovascular accident) ([facility]) 11/18/2021   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 She agrees to DC today as her goals have been meet.     PERTINENT HISTORY:PMH Lt TKA CVA,HTN, HLD, Hypothyroidism, vasovagal syncope, obesity     PAIN:  NPRS scale: 2/10 today upon arrival Pain location: left lateral knee Pain description: ache Aggravating factors: walking prolonged Relieving factors: stretching the knee out   PRECAUTIONS: None   WEIGHT BEARING RESTRICTIONS No   FALLS:  Has patient fallen in last 6 months? 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   PLOF: Independent   PATIENT GOALS reduce pain, return to work as cashier     OBJECTIVE:     PATIENT SURVEYS:    06/17/2022 Eval: FOTO 52% functional 07/29/22: FOTO improved to 67%   COGNITION: 06/17/2022           Overall cognitive status: Within functional limits for tasks assessed                          SENSATION: 06/17/2022 WFL   EDEMA:  06/17/2022 Moderate in left knee at eval     LOWER EXTREMITY ROM:    AROM/PROM Right 06/17/2022 Left 06/17/2022 Left 06/26/22 Left 06/30/22 Left 07/07/22 Left 07/29/22  Hip flexion          Hip extension          Hip abduction          Hip adduction          Hip internal rotation          Hip external rotation          Knee flexion   93/105 AROM 115 in supine heel slide Seated P: 118* A: 113*  Seated  P: 120  Knee extension   2/0   Seated LAQ A: -11* P: 0* Seated LAQ: -3* Seated  A: 0  Ankle dorsiflexion          Ankle plantarflexion          Ankle inversion          Ankle eversion           (Blank rows = not tested)   LOWER EXTREMITY MMT:   MMT Left 06/17/2022 Left 07/02/22 Left 07/16/22 Left 07/21/22  Hip flexion 3+   5  Hip extension       Hip abduction 3+   5  Hip adduction       Hip internal rotation       Hip external rotation       Knee flexion 4 4+ 5- 5  Knee extension 4 4+ 5- 5-  Ankle dorsiflexion       Ankle plantarflexion       Ankle inversion       Ankle eversion        (Blank rows = not tested)   LOWER EXTREMITY SPECIAL TESTS:      FUNCTIONAL TESTS:  06/17/2022 5 times sit to stand: 12.93 seconds, uses UE to push up from lap   GAIT: 06/26/2022: Able to perform ambulation independent within clinic.   06/17/2022 Distance walked: 75 Assistive device utilized: RW Level of assistance: Modified independence Comments: able to walk 15 feet without AD with close supervision, does not use AD PLOF     TODAY'S TREATMENT:  07/29/22 Therex: Nu step L5 X 10 min LE only Seated knee extension with green band 2X10 Standing hamstring curl green band 3X5 Standing hip abd green band 2X10 Sit to stand no UE support X 10 Additional time for FOTO update, updated measurements, and updated HEP creation and review     PATIENT EDUCATION: Education details: HEP, PT plan of care Person educated: Patient Education method: Explanation, Demonstration, Verbal cues, and Handouts Education comprehension: verbalized understanding and needs further education     HOME EXERCISE PROGRAM: Access Code: [email or web address] URL: [email or web address] Date: 07/29/2022 Prepared by:    Exercises - Seated Straight Leg Raise with Quad Contraction  - 2 x daily - 6 x weekly - 2 sets - 10 reps - Sit to Stand Without Arm Support  - 2 x daily - 6 x weekly - 1 sets - 10 reps -   Seated Knee Flexion  Stretch  - 2 x daily - 6 x weekly - 1-2 sets - 10 reps - 5 sec hold - Seated Knee Extension with Resistance  - 2 x daily - 6 x weekly - 2-3 sets - 10 reps - Standing Hamstring Curl with Resistance  - 2 x daily - 6 x weekly - 2-3 sets - 5 reps - Standing Hip Abduction with Resistance at Ankles and Counter Support  - 2 x daily - 6 x weekly - 2 sets - 10 reps - Supine Lower Trunk Rotation  - 2 x daily - 6 x weekly - 1 sets - 10 reps - 5 sec hold - Seated Table Piriformis Stretch  - 2 x daily - 6 x weekly - 5 sets - 10 hold - Supine Bridge  - 2 x daily - 6 x weekly - 1-2 sets - 10 reps - 5 hold - Standing Lumbar Extension  - 2 x daily - 6 x weekly - 1-2 sets - 10 reps - 5 hold ASSESSMENT:   CLINICAL IMPRESSION:  She has done well with PT for left knee TKA. She has now met her PT goals and will be discharged to HEP which was progressed today and also updated to include some back exercises as she has been having back pain.    OBJECTIVE IMPAIRMENTS: decreased activity tolerance, difficulty walking, decreased balance, decreased endurance, decreased mobility, decreased ROM, decreased strength, impaired flexibility, impaired LE use, postural dysfunction, and pain.   ACTIVITY LIMITATIONS: bending, lifting, carry, locomotion, cleaning, community activity, driving, and or occupation   PERSONAL FACTORS:  PMH Lt TKA CVA,HTN, HLD, Hypothyroidism, vasovagal syncope, obesityare also affecting patient's functional outcome.   REHAB POTENTIAL: Good   CLINICAL DECISION MAKING: Stable/uncomplicated   EVALUATION COMPLEXITY: Low       GOALS: Short term PT Goals Target date: 07/15/2022 Pt will be I and compliant with HEP. Baseline:  Goal status: MET 07/14/22 Pt will decrease pain by 25% overall Baseline: Goal status: MET 07/14/22   Long term PT goals Target date: 07/29/2022 Pt will improve Lt knee AROM 0-110 to improve functional mobility Baseline: Goal status: MET Pt will improve  hip/knee strength to  at least 5-/5 MMT to improve functional strength Baseline: Goal status: MET Pt will improve FOTO to at least 59% functional to show improved function Baseline: Goal status: MET, improved to 67% Pt will reduce pain to overall less than 2-3/10 with usual activity and work activity. Baseline: Goal status: MET PLAN: PT FREQUENCY: 2 times per week    PT DURATION: 6 weeks   PLANNED INTERVENTIONS (unless contraindicated): aquatic PT, Canalith repositioning, cryotherapy, Electrical stimulation, Iontophoresis with 4 mg/ml dexamethasome, Moist heat, traction, Ultrasound, gait training, Therapeutic exercise, balance training, neuromuscular re-education, patient/family education, prosthetic training, manual techniques, passive ROM, dry needling, taping, vasopnuematic device, vestibular, spinal manipulations, joint manipulations   PLAN FOR NEXT SESSION:  DC today   R , PT, DPT 07/29/2022, 1:52 PM 

## 2022-08-05 ENCOUNTER — Encounter: Payer: Medicare Other | Admitting: Physical Therapy

## 2022-08-08 NOTE — Progress Notes (Signed)
Chronic Care Management Pharmacy Note  08/22/2022 Name:  Veronica Weber MRN:  [id number] DOB:  [birth year]  Summary: PharmD FU visit.  Doing well currently on improving her LDL and controlling CV risk factors.  Ldl improved on increased dose of statin - stressed adherence.  Recommendations/Changes made from today's visit: Recheck lipids at next OV   Plan: FU 6 months   Subjective: Veronica Weber is an 66 y.o. year old female who is a primary patient of Allwardt, Alyssa M, PA-C.  The CCM team was consulted for assistance with disease management and care coordination needs.    Engaged with patient by telephone for follow up visit in response to provider referral for pharmacy case management and/or care coordination services.   Consent to Services:  The patient was given the following information about Chronic Care Management services today, agreed to services, and gave verbal consent: 1. CCM service includes personalized support from designated clinical staff supervised by the primary care provider, including individualized plan of care and coordination with other care providers 2. 24/7 contact phone numbers for assistance for urgent and routine care needs. 3. Service will only be billed when office clinical staff spend 20 minutes or more in a month to coordinate care. 4. Only one practitioner may furnish and bill the service in a calendar month. 5.The patient may stop CCM services at any time (effective at the end of the month) by phone call to the office staff. 6. The patient will be responsible for cost sharing (co-pay) of up to 20% of the service fee (after annual deductible is met). Patient agreed to services and consent obtained.  Patient Care Team: Allwardt, Randa Evens, PA-C as PCP - General (Physician Assistant) Janina Mayo, MD as PCP - Cardiology (Cardiology) Edythe Clarity, [facility] as Pharmacist (Pharmacist)  Recent office visits:  02/13/2022 OV (PCP)  Allwardt, Randa Evens, PA-C;  no medication changes indicated.   12/16/2021 OV (PCP) Allwardt, Alyssa M, PA-C;  no medication changes indicated.   12/12/2021 OV (PCP) Allwardt, Alyssa M, PA-C;  no medication changes indicated.   12/05/2021 OV (PCP) Allwardt, Alyssa M, PA-C;  no medication changes indicated.   Recent consult visits:  01/24/2022 OV (Orthopedics) Marlou Sa Tonna Corner, MD; no medication changes indicated.   12/31/2021 OV (Neurology) Genia Harold, MD; Stop Abilify, Start gabapentin 300-600 mg QHS for headaches and sleep   11/25/2021 OV (Orthopedics) Magnant, Charles L, PA-C; no medication changes indicated.   10/15/2021 OV (Cardiology) Janina Mayo, MD; START: CHLORTHALIDONE 38m ONCE DAILY    10/07/2021 OV (Orthopedics) DMeredith Pel MD; no medication changes indicated.   09/23/2021 OV (Sports Medicine) EBeacher May MD; no medication changes indicated.   09/16/2021 OV (Sports Medicine) EBeacher May MD; no medication changes indicated.   09/09/2021 OV (Sports Medicine) EBeacher May MD; no medication changes indicated.   Hospital visits:  11/16/2021 ED [facility] To reduce the chance of another stroke: Take the medicines aspirin and plavix. These "grease" platelets, so they don't stick together and cause blockages in the arteries. Take low dose aspirin 81 PLUS clopidogrel/Plavix 75 for 3 weeks  After three weeks, take aspirin 81 mg alone from now on   Objective:  Lab Results  Component Value Date   CREATININE 0.87 05/06/2022   BUN 21 05/06/2022   GFR 69.50 05/06/2022   EGFR 79 11/06/2021   GFRNONAA >60 11/20/2021   NA  141 05/06/2022   K 3.8 05/06/2022   CALCIUM 10.0 05/06/2022   CO2 31 05/06/2022   GLUCOSE 83 05/06/2022    Lab Results  Component Value Date/Time   HGBA1C 6.1 05/06/2022 09:13 AM   HGBA1C 5.8 (H) 11/17/2021 02:01 AM   GFR 69.50 05/06/2022 09:13 AM    GFR 91.53 10/15/2020 11:42 AM    Last diabetic Eye exam: No results found for: "HMDIABEYEEXA"  Last diabetic Foot exam: No results found for: "HMDIABFOOTEX"   Lab Results  Component Value Date   CHOL 163 05/06/2022   HDL 64.10 05/06/2022   LDLCALC 82 05/06/2022   TRIG 84.0 05/06/2022   CHOLHDL 3 05/06/2022       Latest Ref Rng & Units 05/06/2022    9:13 AM 11/20/2021    6:24 AM 11/16/2021    2:39 PM  Hepatic Function  Total Protein 6.0 - 8.3 g/dL 6.7  6.1  6.7   Albumin 3.5 - 5.2 g/dL 4.0  3.5  3.8   AST 0 - 37 U/L 16  35  19   ALT 0 - 35 U/L _0 Alk Phosphatase 39 - 117 U/L 99  86  105   Total Bilirubin 0.2 - 1.2 mg/dL 0.6  0.7  0.5     Lab Results  Component Value Date/Time   TSH 3.21 10/15/2020 11:42 AM       Latest Ref Rng & Units 05/24/2022   12:43 PM 05/06/2022    9:13 AM 11/20/2021    6:24 AM  CBC  WBC 4.0 - 10.5 K/uL 8.0  10.0  6.0   Hemoglobin 12.0 - 15.0 g/dL 10.7  13.7  13.3   Hematocrit 36.0 - 46.0 % 31.4  40.1  39.8   Platelets 150 - 400 K/uL 233  316.0  270     No results found for: "VD25OH"  Clinical ASCVD: Yes  The ASCVD Risk score (Arnett DK, et al., 2019) failed to calculate for the following reasons:   The patient has a prior MI or stroke diagnosis       05/13/2022   11:35 AM 12/16/2021   11:06 AM 12/05/2021    1:19 PM  Depression screen PHQ 2/9  Decreased Interest 0 0 0  Down, Depressed, Hopeless 0 0 0  PHQ - 2 Score 0 0 0  Altered sleeping 0    Tired, decreased energy 1    Change in appetite 2    Feeling bad or failure about yourself  0    Trouble concentrating 0    Moving slowly or fidgety/restless 0    Suicidal thoughts 0    PHQ-9 Score 3    Difficult doing work/chores Not difficult at all        Social History   Tobacco Use  Smoking Status Former   Packs/day: 1.00   Years: 45.00   Total pack years: 45.00   Types: Cigarettes   Start date: 06/01/1976   Quit date: 05/15/2022   Years since quitting: 0.2  Smokeless  Tobacco Never   BP Readings from Last 3 Encounters:  07/17/22 112/60  05/24/22 128/63  05/15/22 137/71   Pulse Readings from Last 3 Encounters:  07/17/22 90  05/24/22 87  05/13/22 95   Wt Readings from Last 3 Encounters:  07/17/22 217 lb 12.8 oz (98.8 kg)  05/22/22 210 lb (95.3 kg)  05/15/22 211 lb 3.2 oz (95.8 kg)   BMI Readings from Last 3 Encounters:  07/17/22  41.15 kg/m  05/22/22 37.20 kg/m  05/15/22 37.41 kg/m    Assessment/Interventions: Review of patient past medical history, allergies, medications, health status, including review of consultants reports, laboratory and other test data, was performed as part of comprehensive evaluation and provision of chronic care management services.   SDOH:  (Social Determinants of Health) assessments and interventions performed: Yes  Financial Resource Strain: Low Risk  (02/19/2022)   Overall Financial Resource Strain (CARDIA)    Difficulty of Paying Living Expenses: Not very hard   Food Insecurity: No Food Insecurity (02/19/2022)   Hunger Vital Sign    Worried About Running Out of Food in the Last Year: Never true    Ran Out of Food in the Last Year: Never true     [place]: No Food Insecurity (02/19/2022)  Depression (PHQ2-9): Low Risk  (05/13/2022)  Financial Resource Strain: Low Risk  (02/19/2022)  Tobacco Use: Medium Risk (08/17/2022)    Veronica Weber  Not on File  Medications Reviewed Today     Reviewed by Meredith Pel, MD (Physician) on 08/17/22 at [place] List Status: <None>   Medication Order Taking? Sig Documenting Provider Last Dose Status Informant  aspirin 81 MG chewable tablet [id number] No Chew 1 tablet (81 mg total) by mouth 2 (two) times daily. Vanetta Mulders, MD Taking Active   atorvastatin (LIPITOR) 80 MG tablet [id number] No TAKE ONE TABLET BY MOUTH EVERY MORNING Allwardt, Alyssa M, PA-C Taking Active   chlorthalidone (HYGROTON) 25 MG tablet [id number] No Take 1  tablet (25 mg total) by mouth daily. Take 1/2 tablet by mouth daily.  Patient taking differently: Take 12.5 mg by mouth daily.   Allwardt, Randa Evens, PA-C Taking Active Self  gabapentin (NEURONTIN) 300 MG capsule [id number] No TAKE 1 TO 2 CAPSULES BY MOUTH EVERYDAY AT BEDTIME Allwardt, Alyssa M, PA-C Taking Active   HYDROcodone-acetaminophen (NORCO/VICODIN) 5-325 MG tablet [id number] No Take 1 tablet by mouth at bedtime as needed for moderate pain. Magnant, Gerrianne Scale, PA-C Taking Active   losartan (COZAAR) 50 MG tablet [id number] No TAKE 1 TABLET BY MOUTH DAILY Allwardt, Alyssa M, PA-C Taking Active   methocarbamol (ROBAXIN) 500 MG tablet [id number] No Take 1 tablet (500 mg total) by mouth every 6 (six) hours as needed for muscle spasms. Vanetta Mulders, MD Taking Active   Multiple Vitamins-Minerals (MULTIVITAMIN WITH MINERALS) tablet [id number] No Take 1 tablet by mouth daily. [provider] Taking Active Self  ondansetron (ZOFRAN) 4 MG tablet [id number] No Take 1 tablet (4 mg total) by mouth every 6 (six) hours as needed for nausea. 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   Essential hypertension 11/16/2021   Hyperlipidemia 11/16/2021   Tobacco use 11/16/2021   Syncope and collapse 06/26/2021    Immunization History  Administered Date(s) Administered   Fluad Quad(high Dose 65+) 06/01/2022   Influenza-Unspecified 05/13/2021   Moderna Sars-Covid-2 Vaccination 11/14/2019, 12/12/2019   PNEUMOCOCCAL CONJUGATE-20 11/17/2021    Conditions to be addressed/monitored:  HTN,  Hx of CVA/Stroke, HLD, Tobacco  Use  Care Plan : General Pharmacy (Adult)  Updates made by Edythe Clarity, RPH since 08/22/2022 12:00 AM     Problem: HTN, HLD, Tobacco use   Priority: High  Onset Date: 02/19/2022     Long-Range Goal: Patient-Specific Goal   Start Date: 02/19/2022  Expected End Date: 08/21/2022  Recent Progress: On track  Priority: High  Note:   Current Barriers:  Unable to achieve control of LDL   Pharmacist Clinical Goal(s):  Patient will achieve control of LDL as evidenced by lans through collaboration with PharmD and provider.   Interventions: 1:1 collaboration with Allwardt, Randa Evens, PA-C regarding development and update of comprehensive plan of care as evidenced by provider attestation and co-signature Inter-disciplinary care team collaboration (see longitudinal plan of care) Comprehensive medication review performed; medication list updated in electronic medical record  Hypertension (BP goal <130/80) -Controlled, not assessed -Current treatment: Losartan 73m Appropriate, Effective, Safe, Accessible Chlorthalidone 12.5106mdaily Appropriate, Effective, Safe, Accessible -Medications previously tried: none noted  -Current home readings: 120/75, 132/76 -Current dietary habits: trying to watch what she eats, hard right now because of her loose fitting dentures.  Eating more jello, potatoes, etc.  Also mentioned TV dinner last night for dinner -Current exercise habits: minimal due to knee pain -Denies hypotensive/hypertensive symptoms -Educated on BP goals and benefits of medications for prevention of heart attack, stroke and kidney damage; Exercise goal of 150 minutes per week; Importance of home blood pressure monitoring; Proper BP monitoring technique; Symptoms of hypotension and importance of maintaining adequate hydration; -Counseled to monitor BP at home a few times per week, document, and provide log at future appointments -Recommended to continue  current medication Continue to work on lifestyle mods, limit salt intake  Hyperlipidemia: (LDL goal < 70) 08/18/22 -Uncontrolled, most recently 82 -Current treatment: Atorvastatin 8058mppropriate, Query effective, ,  -Medications previously tried: atorvastatin 20m83mCurrent dietary patterns: see HTN -Current exercise habits: see HTN -Educated on Cholesterol goals;  Benefits of statin for ASCVD risk reduction; Importance of limiting foods high in cholesterol; -Recommended to continue current medication Cholesterol doing much better on 80mg67mrvastatin  Congratulated her on efforts. Continue to work on dietary mods and exercise. Stressed adherence No changes at this time, continue routine screenings.  Hx of CVA (Goal: Reduce risk) 08/18/22 -Controlled, working on controlling risk factors -Current treatment  None -Medications previously tried: none noted -Supposed to switch to ASA 81mg 18mr this refill of Plavix is completed.  Denies any abnormal bleeding. -Trying to control risk factors, LDL above goal, BP controlled, she is working on smoking cessation  -Recommended to continue current medication -Patient stopped her clopidogrel and switched to plavix.   Important to control BP and other risk facors like LDL. Currently controlled. NO changes at this time.  Tobacco use (Goal Smoking Cessation) -Not ideally controlled -Previous quit attempts: yes -Current treatment  Nicotine 14mg/253mpatch Appropriate, Effective, Safe, Accessible -Patient smokes After 30 minutes of waking -Patient triggers include: walking the dog, after dinner  -Counseled on patch placement, side effects, and option to remove at night if they experience trouble sleeping or bad dreams.  Counseled to allow lozenge to dissolve and absorb in cheek pocket, rather than swallow, to reduce GI side effects. -Recommended continue current efforts, call me if she needs support.  She has cut back substantially and  reports only smoking 3-4 cigs per day.  Still motivated to quit.  Patient Goals/Self-Care Activities Patient will:  - focus on medication adherence by  pill packs check blood pressure a few times per week, document, and provide at future appointments  Follow Up Plan: The care management team will reach out to the patient again over the next 180 days.           Medication Assistance: None required.  Patient affirms current coverage meets needs.  Compliance/Adherence/Medication fill history: Care Gaps: Tdap  Star-Rating Drugs: Atorvastatin 16m 07/03/22 Losartan 535m11/28/23 90d  Patient's preferred pharmacy is:  [facility] [place] [address] [place]: [phone]: [phone] [address]hone: [phone]: [id number] [place] [place]  [id number]: [phone]: [phone] 8255[facility] [address]one: [phone]: [phone] pill box? Yes Pt endorses 100% compliance  We discussed: Benefits of medication synchronization, packaging and delivery as well as enhanced pharmacist oversight with [facility]. Patient decided to: Continue current medication management strategy  Care Plan and Follow Up Patient Decision:  Patient agrees to Care Plan and Follow-up.  Plan: The care management team will reach out to the patient again over the next 180 days.  ChrBeverly MilchharmD Clinical Pharmacist  [phone]

## 2022-08-11 ENCOUNTER — Ambulatory Visit
Admission: RE | Admit: 2022-08-11 | Discharge: 2022-08-11 | Disposition: A | Discharge status: Home / Self Care | Payer: Medicare Other | Source: Ambulatory Visit | Attending: Student | Admitting: Student

## 2022-08-11 ENCOUNTER — Other Ambulatory Visit: Payer: Self-pay | Admitting: Student

## 2022-08-11 DIAGNOSIS — R918 Other nonspecific abnormal finding of lung field: Secondary | ICD-10-CM

## 2022-08-15 ENCOUNTER — Ambulatory Visit: Payer: Medicare Other | Admitting: Orthopedic Surgery

## 2022-08-15 DIAGNOSIS — Z96652 Presence of left artificial knee joint: Secondary | ICD-10-CM

## 2022-08-17 ENCOUNTER — Encounter: Payer: Self-pay | Admitting: Orthopedic Surgery

## 2022-08-17 NOTE — Progress Notes (Signed)
Office Visit Note   Patient: Veronica Weber           Date of Birth: 07-Sep-1955           MRN: [id number] Visit Date: 08/15/2022 Requested by: Allwardt, Crist Infante, PA-C [address] [place],  [place] [address] PCP: Bary Leriche, PA-C  Subjective: Chief Complaint  Patient presents with   Left Knee - Follow-up    HPI: Veronica Weber is a 66 y.o. female who presents to the office reporting for follow-up after left total knee replacement 05/22/2022.  She works at [employer].  Overall she is doing well with her left knee replacement..                ROS: All systems reviewed are negative as they relate to the chief complaint within the history of present illness.  Patient denies fevers or chills.  Assessment & Plan: Visit Diagnoses:  1. S/P total knee arthroplasty, left     Plan: Impression is doing well following left knee replacement.  Follow-up in June to potentially schedule right knee replacement at that time.  Follow-Up Instructions: No follow-ups on file.   Orders:  No orders of the defined types were placed in this encounter.  No orders of the defined types were placed in this encounter.     Procedures: No procedures performed   Clinical Data: No additional findings.  Objective: Vital Signs: There were no vitals taken for this visit.  Physical Exam:  Constitutional: Patient appears well-developed HEENT:  Head: Normocephalic Eyes:EOM are normal Neck: Normal range of motion Cardiovascular: Normal rate Pulmonary/chest: Effort normal Neurologic: Patient is alert Skin: Skin is warm Psychiatric: Patient has normal mood and affect  Ortho Exam: Ortho exam demonstrates excellent range of motion of that left knee from 0 to about 1 10-1 15.  Extensor mechanism intact collaterals are stable to varus stress at 0 30 and 90 degrees incision intact with no effusion.  Specialty Comments:  No specialty comments available.  Imaging: No  results found.   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   Essential hypertension 11/16/2021   Hyperlipidemia 11/16/2021   Tobacco use 11/16/2021   Syncope and collapse 06/26/2021   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   Hyperlipidemia    Hypertension    Thyroid disease    Tobacco use     Family History  Problem Relation Age of Onset   Arthritis Mother    Cancer Maternal Grandmother    Diabetes Maternal Grandmother    Diabetes Maternal Grandfather    Healthy Daughter    Healthy Son     Past Surgical History:  Procedure Laterality Date   APPENDECTOMY  1976   CATARACT EXTRACTION     1997, 1998   CESAREAN SECTION  1983   CHOLECYSTECTOMY  1998   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   Procedure: LEFT TOTAL KNEE ARTHROPLASTY;  Surgeon: [name], MD;  Location: [facility]  OR;  Service: Orthopedics;  Laterality: Left;   TUBAL LIGATION  1994   Social History   Occupational History   Not on file  Tobacco Use   Smoking status: Former    Packs/day: 1.00    Years: 45.00    Total pack years: 45.00    Types: Cigarettes    Start date: 06/01/1976    Quit date: 05/15/2022    Years since quitting: 0.2   Smokeless tobacco: Never  Vaping Use   Vaping Use: Never used  Substance and Sexual Activity   Alcohol use: Not Currently   Drug use: Never   Sexual activity: Not Currently

## 2022-08-18 ENCOUNTER — Ambulatory Visit: Payer: Medicare Other | Admitting: Pharmacist

## 2022-08-18 DIAGNOSIS — E782 Mixed hyperlipidemia: Secondary | ICD-10-CM

## 2022-08-18 DIAGNOSIS — I63511 Cerebral infarction due to unspecified occlusion or stenosis of right middle cerebral artery: Secondary | ICD-10-CM

## 2022-08-22 NOTE — Patient Instructions (Addendum)
Visit Information   Goals Addressed             This Visit's Progress    LDL < 100   On track    Timeframe:  Long-Range Goal Priority:  High Start Date: 02/18/22                            Expected End Date:   08/20/22                    Follow Up Date 05/21/22    Take atorvastatin 80mg  - repeat LDL next OV    Why is this important?   These steps will help you keep on track with your medicines.   Notes: Working on dietary/lifestyle changes       Patient Care Plan: General Pharmacy (Adult)     Problem Identified: HTN, HLD, Tobacco use   Priority: High  Onset Date: 02/19/2022     Long-Range Goal: Patient-Specific Goal   Start Date: 02/19/2022  Expected End Date: 08/21/2022  Recent Progress: On track  Priority: High  Note:   Current Barriers:  Unable to achieve control of LDL   Pharmacist Clinical Goal(s):  Patient will achieve control of LDL as evidenced by lans through collaboration with PharmD and provider.   Interventions: 1:1 collaboration with Allwardt, 08/23/2022, PA-C regarding development and update of comprehensive plan of care as evidenced by provider attestation and co-signature Inter-disciplinary care team collaboration (see longitudinal plan of care) Comprehensive medication review performed; medication list updated in electronic medical record  Hypertension (BP goal <130/80) -Controlled, not assessed -Current treatment: Losartan 50mg  Appropriate, Effective, Safe, Accessible Chlorthalidone 12.5mg  daily Appropriate, Effective, Safe, Accessible -Medications previously tried: none noted  -Current home readings: 120/75, 132/76 -Current dietary habits: trying to watch what she eats, hard right now because of her loose fitting dentures.  Eating more jello, potatoes, etc.  Also mentioned TV dinner last night for dinner -Current exercise habits: minimal due to knee pain -Denies hypotensive/hypertensive symptoms -Educated on BP goals and benefits of  medications for prevention of heart attack, stroke and kidney damage; Exercise goal of 150 minutes per week; Importance of home blood pressure monitoring; Proper BP monitoring technique; Symptoms of hypotension and importance of maintaining adequate hydration; -Counseled to monitor BP at home a few times per week, document, and provide log at future appointments -Recommended to continue current medication Continue to work on lifestyle mods, limit salt intake  Hyperlipidemia: (LDL goal < 70) 08/18/22 -Uncontrolled, most recently 82 -Current treatment: Atorvastatin 80mg  Appropriate, Query effective, ,  -Medications previously tried: atorvastatin 40mg   -Current dietary patterns: see HTN -Current exercise habits: see HTN -Educated on Cholesterol goals;  Benefits of statin for ASCVD risk reduction; Importance of limiting foods high in cholesterol; -Recommended to continue current medication Cholesterol doing much better on 80mg  atorvastatin  Congratulated her on efforts. Continue to work on dietary mods and exercise. Stressed adherence No changes at this time, continue routine screenings.  Hx of CVA (Goal: Reduce risk) 08/18/22 -Controlled, working on controlling risk factors -Current treatment  None -Medications previously tried: none noted -Supposed to switch to ASA 81mg  after this refill of Plavix is completed.  Denies any abnormal bleeding. -Trying to control risk factors, LDL above goal, BP controlled, she is working on smoking cessation  -Recommended to continue current medication -Patient stopped her clopidogrel and switched to plavix.   Important to control BP and other risk facors  like LDL. Currently controlled. NO changes at this time.  Tobacco use (Goal Smoking Cessation) -Not ideally controlled -Previous quit attempts: yes -Current treatment  Nicotine 14mg /24hr patch Appropriate, Effective, Safe, Accessible -Patient smokes After 30 minutes of waking -Patient  triggers include: walking the dog, after dinner  -Counseled on patch placement, side effects, and option to remove at night if they experience trouble sleeping or bad dreams.  Counseled to allow lozenge to dissolve and absorb in cheek pocket, rather than swallow, to reduce GI side effects. -Recommended continue current efforts, call me if she needs support.  She has cut back substantially and reports only smoking 3-4 cigs per day.  Still motivated to quit.  Patient Goals/Self-Care Activities Patient will:  - focus on medication adherence by pill packs check blood pressure a few times per week, document, and provide at future appointments  Follow Up Plan: The care management team will reach out to the patient again over the next 180 days.          The patient verbalized understanding of instructions, educational materials, and care plan provided today and DECLINED offer to receive copy of patient instructions, educational materials, and care plan.  Telephone follow up appointment with pharmacy team member scheduled for: 6 months  , [facility]  [facility], PharmD Clinical Pharmacist  [facility] [phone]

## 2022-08-26 ENCOUNTER — Ambulatory Visit (INDEPENDENT_AMBULATORY_CARE_PROVIDER_SITE_OTHER): Payer: Medicare Other | Admitting: Physician Assistant

## 2022-08-26 ENCOUNTER — Encounter: Payer: Self-pay | Admitting: Physician Assistant

## 2022-08-26 VITALS — BP 112/70 | HR 66 | Temp 98.1°F | Ht 61.0 in | Wt 227.8 lb

## 2022-08-26 DIAGNOSIS — M79671 Pain in right foot: Secondary | ICD-10-CM | POA: Diagnosis not present

## 2022-08-26 DIAGNOSIS — N3281 Overactive bladder: Secondary | ICD-10-CM | POA: Diagnosis not present

## 2022-08-26 DIAGNOSIS — Z87891 Personal history of nicotine dependence: Secondary | ICD-10-CM

## 2022-08-26 DIAGNOSIS — M170 Bilateral primary osteoarthritis of knee: Secondary | ICD-10-CM

## 2022-08-26 DIAGNOSIS — I10 Essential (primary) hypertension: Secondary | ICD-10-CM

## 2022-08-26 DIAGNOSIS — M79672 Pain in left foot: Secondary | ICD-10-CM

## 2022-08-26 MED ORDER — OXYBUTYNIN CHLORIDE 5 MG PO TABS: 2.5000 mg | ORAL_TABLET | Freq: Two times a day (BID) | ORAL | 2 refills | Status: DC

## 2022-08-26 NOTE — Patient Instructions (Signed)
GREAT JOB with smoking cessation!  Try the Ditropan as directed for overactive bladder - let me know if this is working or if any concerns.  Keep follow up with ortho specialists.  Blood pressure looks great, continue current regimen.  Please call sooner if any concerns.

## 2022-08-26 NOTE — Progress Notes (Unsigned)
Subjective:    Patient ID: Veronica Weber, female    DOB: 11-30-1955, 66 y.o.   MRN: [id number]  Chief Complaint  Patient presents with   Hypertension    Pt is here to f/u on BP.    HPI Patient is in today for regular recheck chronic conditions.  HTN has been well controlled per patient.   Left total knee 05/22/22; states her pain has been much better since replacement. Therapy ended in November. Pt states she isn't back to work yet & hoping to go back in January.  Still not walking as much because having intermittent pain in right knee, which also needs to be replaced. Also has pain from bone spurs in her feet.   Also reports overactive bladder concerns. Sometimes not making it to the bathroom in time when feeling urge to go, has been going on several months.   Stopped smoking 3 months ago after her surgery.   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   Procedure: LEFT TOTAL KNEE ARTHROPLASTY;  Surgeon: [name], MD;  Location: [facility] OR;  Service: Orthopedics;  Laterality: Left;   TUBAL LIGATION  1994    Family History  Problem Relation Age of Onset   Arthritis Mother    Cancer Maternal Grandmother    Diabetes Maternal Grandmother    Diabetes Maternal Grandfather    Healthy Daughter    Healthy Son     Social History   Tobacco Use   Smoking status: Former    Packs/day: 1.00    Years: 45.00    Total pack years: 45.00    Types: Cigarettes    Start date: 06/01/1976    Quit date: 05/15/2022    Years since quitting: 0.2   Smokeless tobacco: Never  Vaping Use   Vaping  Use: Never used  Substance Use Topics   Alcohol use: Not Currently   Drug use: Never     Not on File  Review of Systems NEGATIVE UNLESS OTHERWISE INDICATED IN HPI      Objective:     BP 112/70   Pulse 66   Temp 98.1 F (36.7 C)   Ht 5\' 1"  (1.549 m)   Wt 227 lb 12.8 oz (103.3 kg)   BMI 43.04 kg/m   Wt Readings from Last 3 Encounters:  08/26/22 227 lb 12.8 oz (103.3 kg)  07/17/22 217 lb 12.8 oz (98.8 kg)  05/22/22 210 lb (95.3 kg)    BP Readings from Last 3 Encounters:  08/26/22 112/70  07/17/22 112/60  05/24/22 128/63     Physical Exam Vitals and nursing note reviewed.  Constitutional:      Appearance: Normal appearance. She is obese. She is not toxic-appearing.  HENT:     Head: Normocephalic and atraumatic.  Eyes:     Extraocular Movements: Extraocular movements intact.     Conjunctiva/sclera: Conjunctivae normal.     Pupils: Pupils are equal, round, and reactive to light.  Cardiovascular:  Rate and Rhythm: Normal rate and regular rhythm.     Pulses: Normal pulses.     Heart sounds: Normal heart sounds.  Pulmonary:     Effort: Pulmonary effort is normal.     Breath sounds: Normal breath sounds.  Musculoskeletal:        General: Normal range of motion.     Cervical back: Normal range of motion and neck supple.  Skin:    General: Skin is warm and dry.  Neurological:     General: No focal deficit present.     Mental Status: She is alert and oriented to person, place, and time.  Psychiatric:        Mood and Affect: Mood normal.        Behavior: Behavior normal.        Assessment & Plan:  Essential hypertension Assessment & Plan: Normotensive Cont Losartan 50 mg and chlorthalidone 12.5 mg daily Monitor readings at home    Osteoarthritis of both knees, unspecified osteoarthritis type Assessment & Plan: Currently recovering from knee replacement; hoping to have her R knee done next year   Bilateral foot pain Assessment & Plan: Spurs in  her feet per patient; will follow with podiatry; limits her exercise    Overactive bladder Assessment & Plan: Start on ditropan 2.5 mg BID Pt aware of risks vs benefits and possible adverse reactions She will let me know how she's doing with this  Consider urogyn referral    Former smoker  Other orders -     oxyBUTYnin Chloride; Take 0.5 tablets (2.5 mg total) by mouth 2 (two) times daily.  Dispense: 30 tablet; Refill: 2        Return in about 6 months (around 02/25/2023) for Regular 6 month follow-up .  This note was prepared with assistance of Conservation officer, historic buildings. Occasional wrong-word or sound-a-like substitutions may have occurred due to the inherent limitations of voice recognition software.     Jazzmon Prindle M Izaiyah Kleinman, PA-C

## 2022-08-27 NOTE — Assessment & Plan Note (Signed)
Start on ditropan 2.5 mg BID Pt aware of risks vs benefits and possible adverse reactions She will let me know how she's doing with this  Consider urogyn referral

## 2022-08-27 NOTE — Assessment & Plan Note (Signed)
Currently recovering from knee replacement; hoping to have her R knee done next year

## 2022-08-27 NOTE — Assessment & Plan Note (Signed)
Normotensive Cont Losartan 50 mg and chlorthalidone 12.5 mg daily Monitor readings at home

## 2022-08-27 NOTE — Assessment & Plan Note (Signed)
Spurs in her feet per patient; will follow with podiatry; limits her exercise

## 2022-09-01 ENCOUNTER — Other Ambulatory Visit: Payer: Self-pay | Admitting: Internal Medicine

## 2022-09-30 ENCOUNTER — Other Ambulatory Visit: Payer: Self-pay | Admitting: Physician Assistant

## 2022-10-15 ENCOUNTER — Ambulatory Visit: Payer: Medicare Other | Admitting: Podiatry

## 2022-10-15 DIAGNOSIS — M7661 Achilles tendinitis, right leg: Secondary | ICD-10-CM

## 2022-10-15 MED ORDER — BETAMETHASONE SOD PHOS & ACET 6 (3-3) MG/ML IJ SUSP
3.0000 mg | Freq: Once | INTRAMUSCULAR | Status: AC
  Administered 2022-10-15: 3 mg via INTRA_ARTICULAR

## 2022-10-15 NOTE — Progress Notes (Signed)
[place] [id number] both    Chief Complaint  Patient presents with   Foot Pain    Bilateral heel pain, patient would like injections today, left foot is worse than the right    Subjective: 67 y.o. female presenting today for follow-up evaluation of bilateral heel pain right greater than the left.  Patient states that her Achilles tendon to the right lower extremity is continuing to cause pain and tenderness.  She is about to return to work and she would like to have it evaluated prior to returning.  She just recovered from the surgery left.   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  Dermatology: Skin is warm, dry and supple bilateral lower extremities. Negative for open lesions or macerations bilateral.   Vascular: Dorsalis Pedis and Posterior Tibial pulses palpable bilateral.  Capillary fill time is immediate to all digits.  Neurological: Epicritic and protective threshold intact bilateral.   Musculoskeletal: Tenderness to palpation to the plantar aspect of the left heel along the plantar fascia as well as the posterior tubercle of the calcaneus right. All other joints range of motion within normal limits bilateral. Strength 5/5 in all groups bilateral.   Radiographic exam B/L feet 04/23/2022: Normal osseous mineralization.  Joint spaces preserved. No fracture/dislocation/boney destruction. No other soft tissue abnormalities or radiopaque foreign bodies.  Posterior and plantar heel spurs are noted bilateral on lateral view  Assessment: 1. Plantar fasciitis left foot; currently asymptomatic 2.  Insertional Achilles tendinitis right 3.  Posterior and plantar heel spurs bilateral  Plan of Care:  1. Patient evaluated. Xrays reviewed.   2. Injection of 0.5cc Celestone soluspan injected into the posterior tubercle of the right calcaneus 3.  Continue meloxicam 15 mg daily as needed 4.  Ultimately the patient may need surgery since she has been dealing with this pain now for over 1 year despite conservative treatment.  She needs to go back to work at the moment.  She just was released by her orthopedist after knee replacement surgery to the left knee. 5.  Return to clinic as needed   Edrick Kins, DPM [facility]  Dr. Edrick Kins, DPM    [address],  [address]                Office [phone]  Fax [phone]

## 2022-12-21 ENCOUNTER — Other Ambulatory Visit: Payer: Self-pay | Admitting: Physician Assistant

## 2023-01-21 ENCOUNTER — Encounter: Payer: Self-pay | Admitting: Physician Assistant

## 2023-01-21 ENCOUNTER — Ambulatory Visit (INDEPENDENT_AMBULATORY_CARE_PROVIDER_SITE_OTHER): Payer: Medicare Other | Admitting: Physician Assistant

## 2023-01-21 VITALS — BP 128/84 | HR 92 | Temp 98.2°F | Ht 61.0 in | Wt 215.8 lb

## 2023-01-21 DIAGNOSIS — S0501XA Injury of conjunctiva and corneal abrasion without foreign body, right eye, initial encounter: Secondary | ICD-10-CM | POA: Diagnosis not present

## 2023-01-21 DIAGNOSIS — H18831 Recurrent erosion of cornea, right eye: Secondary | ICD-10-CM | POA: Diagnosis not present

## 2023-01-21 NOTE — Patient Instructions (Signed)
Fluorescein stain of your Right eye looks consistent with corneal abrasion.  Please go to the following address now:  [address] [place], [address] [facility]

## 2023-01-21 NOTE — Progress Notes (Signed)
Subjective:    Patient ID: Veronica Weber, female    DOB: 03/12/1956, 67 y.o.   MRN: [id number]  Chief Complaint  Patient presents with   Eye Pain    Pt c/o something in her right eye; Pt woke up Tuesday morning feeling as something in the right eye, when rubbing it and washed, flushed it and nothing seems to helping, feels like something is there. Pt eye is very red and irritated, and draining;     Eye Pain    Patient is in today for R eye pain and redness. States she woke up yesterday feeling like something was in it and it was red and irritated. Tried to wash it out in the shower. Also tried OTC eye redness drops and saline flushes with no relief. Pain feels worse today. Sensitive to the light. No drainage from eye. Feels blurry in vision, but still able to see. Doesn't wear contacts. Hx of cataracts. No current eye Dr.  No other symptoms.   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   Procedure: LEFT TOTAL KNEE ARTHROPLASTY;  Surgeon: [name], MD;  Location: [facility] OR;  Service: Orthopedics;  Laterality: Left;   TUBAL LIGATION  1994    Family History  Problem Relation Age of Onset   Arthritis Mother    Cancer Maternal Grandmother    Diabetes Maternal Grandmother    Diabetes Maternal Grandfather    Healthy Daughter    Healthy Son     Social History   Tobacco Use   Smoking status: Former    Packs/day: 1.00    Years: 45.00    Additional pack years: 0.00    Total pack years: 45.00    Types: Cigarettes    Start date: 06/01/1976    Quit date: 05/15/2022    Years since  quitting: 0.6   Smokeless tobacco: Never  Vaping Use   Vaping Use: Never used  Substance Use Topics   Alcohol use: Not Currently   Drug use: Never     Not on File  Review of Systems  Eyes:  Positive for pain.   NEGATIVE UNLESS OTHERWISE INDICATED IN HPI      Objective:     BP 128/84 (BP Location: Left Arm)   Pulse 92   Temp 98.2 F (36.8 C) (Temporal)   Ht 5\' 1"  (1.549 m)   Wt 215 lb 12.8 oz (97.9 kg)   SpO2 96%   BMI 40.78 kg/m   Wt Readings from Last 3 Encounters:  01/21/23 215 lb 12.8 oz (97.9 kg)  08/26/22 227 lb 12.8 oz (103.3 kg)  07/17/22 217 lb 12.8 oz (98.8 kg)    BP Readings from Last 3 Encounters:  01/21/23 128/84  08/26/22 112/70  07/17/22 112/60     Physical Exam Eyes:     General: Lids are normal.        Right eye: No foreign body, discharge or hordeolum.     Extraocular Movements:  Right eye: Normal extraocular motion.     Conjunctiva/sclera:     Right eye: Right conjunctiva is injected.         Assessment & Plan:  Abrasion of right cornea, initial encounter  --Fluorescein stain in office today reveals green uptake over central part of eye c/w concern for corneal abrasion. --Called on-call ophthalmology, Dr. Burgess Estelle. Pt will go to their office now to be seen. Her daughter will drive her to the address provided in AVS.      Fransico Sciandra M Winter Jocelyn, PA-C

## 2023-01-23 ENCOUNTER — Encounter: Payer: Self-pay | Admitting: Physician Assistant

## 2023-01-26 ENCOUNTER — Other Ambulatory Visit: Payer: Self-pay | Admitting: Physician Assistant

## 2023-01-27 NOTE — Telephone Encounter (Signed)
Last OV: 01/21/23  Next OV: 02/24/23  Last Filled: 05/26/22  Quantity: 90 w/ 1 refill

## 2023-02-09 ENCOUNTER — Ambulatory Visit: Payer: Medicare Other | Admitting: Orthopedic Surgery

## 2023-02-09 ENCOUNTER — Encounter: Payer: Self-pay | Admitting: Orthopedic Surgery

## 2023-02-09 VITALS — Ht 60.75 in | Wt 214.8 lb

## 2023-02-09 DIAGNOSIS — Z96652 Presence of left artificial knee joint: Secondary | ICD-10-CM | POA: Diagnosis not present

## 2023-02-09 NOTE — Progress Notes (Signed)
Office Visit Note   Patient: Veronica Weber           Date of Birth: 1955/10/29           MRN: [id number] Visit Date: 02/09/2023 Requested by: Allwardt, Crist Infante, PA-C [address] [place],  [place] [address] PCP: Bary Leriche, PA-C  Subjective: Chief Complaint  Patient presents with   Left Knee - Follow-up    HPI: Veronica Weber is a 67 y.o. female who presents to the office reporting right knee pain.  She is doing well from her left total knee replacement.  Was considering getting the right knee done sometime this year.  However she does have foot surgery pending which I would like for her to do first before any type of knee replacement.  She states she has "bone spurs" in the ankle.  This would be a 33-month recovery process for what ever procedure she has planned.  She has not been back to work yet secondary primarily to the right knee and right ankle..                ROS: All systems reviewed are negative as they relate to the chief complaint within the history of present illness.  Patient denies fevers or chills.  Assessment & Plan: Visit Diagnoses:  1. S/P total knee arthroplasty, left     Plan: Impression is doing well from left total knee replacement.  Right knee also has some arthritis but not severe enough yet that she wants to undergo replacement.  I do recommend that she undergo all foot procedures necessary and have those heel before we undertake right knee replacement.  Could consider injections later this year if she is trying to buy time following her impending foot surgery.  Follow-Up Instructions: No follow-ups on file.   Orders:  No orders of the defined types were placed in this encounter.  No orders of the defined types were placed in this encounter.     Procedures: No procedures performed   Clinical Data: No additional findings.  Objective: Vital Signs: Ht 5' 0.75" (1.543 m)   Wt 214 lb 12.8 oz (97.4 kg)   BMI 40.92  kg/m   Physical Exam:  Constitutional: Patient appears well-developed HEENT:  Head: Normocephalic Eyes:EOM are normal Neck: Normal range of motion Cardiovascular: Normal rate Pulmonary/chest: Effort normal Neurologic: Patient is alert Skin: Skin is warm Psychiatric: Patient has normal mood and affect  Ortho Exam: Ortho exam demonstrates slight varus alignment in the right lower extremity.  Left knee has excellent range of motion from full extension to 115 of flexion.  Gets collateral ligament stability.  No effusion in the right knee.  Range of motion there is about 5-1 10.  Collateral cruciate ligaments are stable.  Specialty Comments:  No specialty comments available.  Imaging: No results found.   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   Essential hypertension 11/16/2021   Hyperlipidemia 11/16/2021   Tobacco use 11/16/2021   Syncope and collapse 06/26/2021   Past  Medical History:  Diagnosis Date   Abortion    x3   Arthritis    Cataract    Emphysema/COPD (HCC)    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   Hyperlipidemia    Hypertension    Thyroid disease    Tobacco use     Family History  Problem Relation Age of Onset   Arthritis Mother    Cancer Maternal Grandmother    Diabetes Maternal Grandmother    Diabetes Maternal Grandfather    Healthy Daughter    Healthy Son     Past Surgical History:  Procedure Laterality Date   APPENDECTOMY  1976   CATARACT EXTRACTION     1997, 1998    CESAREAN SECTION  1983   CHOLECYSTECTOMY  1998   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   Procedure: LEFT TOTAL KNEE ARTHROPLASTY;  Surgeon: [name], MD;  Location: [facility] OR;  Service: Orthopedics;  Laterality: Left;   TUBAL LIGATION  1994   Social History   Occupational History   Not on file  Tobacco Use   Smoking status: Former    Packs/day: 1.00    Years: 45.00    Additional pack years: 0.00    Total pack years: 45.00    Types: Cigarettes    Start date: 06/01/1976    Quit date: 05/15/2022    Years since quitting: 0.7   Smokeless tobacco: Never  Vaping Use   Vaping Use: Never used  Substance and Sexual Activity   Alcohol use: Not Currently   Drug use: Never   Sexual activity: Not Currently

## 2023-02-12 ENCOUNTER — Ambulatory Visit (INDEPENDENT_AMBULATORY_CARE_PROVIDER_SITE_OTHER): Payer: Medicare Other

## 2023-02-12 VITALS — Wt 214.0 lb

## 2023-02-12 DIAGNOSIS — Z0000 Encounter for general adult medical examination without abnormal findings: Secondary | ICD-10-CM | POA: Diagnosis not present

## 2023-02-12 NOTE — Patient Instructions (Signed)
Veronica Weber , Thank you for taking time to come for your Medicare Wellness Visit. I appreciate your ongoing commitment to your health goals. Please review the following plan we discussed and let me know if I can assist you in the future.   These are the goals we discussed:  Goals      LDL < 100     Timeframe:  Long-Range Goal Priority:  High Start Date: 02/18/22                            Expected End Date:   08/20/22                    Follow Up Date 05/21/22    Take atorvastatin 80mg  - repeat LDL next OV    Why is this important?   These steps will help you keep on track with your medicines.   Notes: Working on dietary/lifestyle changes     Patient Stated     Get better mobility in knee         This is a list of the screening recommended for you and due dates:  Health Maintenance  Topic Date Due   DTaP/Tdap/Td vaccine (1 - Tdap) Never done   Zoster (Shingles) Vaccine (1 of 2) Never done   Colon Cancer Screening  Never done   Hepatitis C Screening  01/21/2024*   Flu Shot  04/02/2023   Mammogram  06/17/2023   Screening for Lung Cancer  08/12/2023   Medicare Annual Wellness Visit  02/12/2024   Pneumonia Vaccine  Completed   DEXA scan (bone density measurement)  Completed   HPV Vaccine  Aged Out   COVID-19 Vaccine  Discontinued  *Topic was postponed. The date shown is not the original due date.    Advanced directives: Advance directive discussed with you today. Even though you declined this today please call our office should you change your mind and we can give you the proper paperwork for you to fill out.  Conditions/risks identified: get better mobility in knees   Next appointment: Follow up in one year for your annual wellness visit    Preventive Care 65 Years and Older, Female Preventive care refers to lifestyle choices and visits with your health care provider that can promote health and wellness. What does preventive care include? A yearly physical  exam. This is also called an annual well check. Dental exams once or twice a year. Routine eye exams. Ask your health care provider how often you should have your eyes checked. Personal lifestyle choices, including: Daily care of your teeth and gums. Regular physical activity. Eating a healthy diet. Avoiding tobacco and drug use. Limiting alcohol use. Practicing safe sex. Taking low-dose aspirin every day. Taking vitamin and mineral supplements as recommended by your health care provider. What happens during an annual well check? The services and screenings done by your health care provider during your annual well check will depend on your age, overall health, lifestyle risk factors, and family history of disease. Counseling  Your health care provider may ask you questions about your: Alcohol use. Tobacco use. Drug use. Emotional well-being. Home and relationship well-being. Sexual activity. Eating habits. History of falls. Memory and ability to understand (cognition). Work and work Astronomer. Reproductive health. Screening  You may have the following tests or measurements: Height, weight, and BMI. Blood pressure. Lipid and cholesterol levels. These may be checked every 5 years, or more  frequently if you are over 14 years old. Skin check. Lung cancer screening. You may have this screening every year starting at age 50 if you have a 30-pack-year history of smoking and currently smoke or have quit within the past 15 years. Fecal occult blood test (FOBT) of the stool. You may have this test every year starting at age 2. Flexible sigmoidoscopy or colonoscopy. You may have a sigmoidoscopy every 5 years or a colonoscopy every 10 years starting at age 77. Hepatitis C blood test. Hepatitis B blood test. Sexually transmitted disease (STD) testing. Diabetes screening. This is done by checking your blood sugar (glucose) after you have not eaten for a while (fasting). You may have this  done every 1-3 years. Bone density scan. This is done to screen for osteoporosis. You may have this done starting at age 49. Mammogram. This may be done every 1-2 years. Talk to your health care provider about how often you should have regular mammograms. Talk with your health care provider about your test results, treatment options, and if necessary, the need for more tests. Vaccines  Your health care provider may recommend certain vaccines, such as: Influenza vaccine. This is recommended every year. Tetanus, diphtheria, and acellular pertussis (Tdap, Td) vaccine. You may need a Td booster every 10 years. Zoster vaccine. You may need this after age 38. Pneumococcal 13-valent conjugate (PCV13) vaccine. One dose is recommended after age 48. Pneumococcal polysaccharide (PPSV23) vaccine. One dose is recommended after age 6. Talk to your health care provider about which screenings and vaccines you need and how often you need them. This information is not intended to replace advice given to you by your health care provider. Make sure you discuss any questions you have with your health care provider. Document Released: 09/14/2015 Document Revised: 05/07/2016 Document Reviewed: 06/19/2015 Elsevier Interactive Patient Education  2017 [employer].  Fall Prevention in the Home Falls can cause injuries. They can happen to people of all ages. There are many things you can do to make your home safe and to help prevent falls. What can I do on the outside of my home? Regularly fix the edges of walkways and driveways and fix any cracks. Remove anything that might make you trip as you walk through a door, such as a raised step or threshold. Trim any bushes or trees on the path to your home. Use bright outdoor lighting. Clear any walking paths of anything that might make someone trip, such as rocks or tools. Regularly check to see if handrails are loose or broken. Make sure that both sides of any steps have  handrails. Any raised decks and porches should have guardrails on the edges. Have any leaves, snow, or ice cleared regularly. Use sand or salt on walking paths during winter. Clean up any spills in your garage right away. This includes oil or grease spills. What can I do in the bathroom? Use night lights. Install grab bars by the toilet and in the tub and shower. Do not use towel bars as grab bars. Use non-skid mats or decals in the tub or shower. If you need to sit down in the shower, use a plastic, non-slip stool. Keep the floor dry. Clean up any water that spills on the floor as soon as it happens. Remove soap buildup in the tub or shower regularly. Attach bath mats securely with double-sided non-slip rug tape. Do not have throw rugs and other things on the floor that can make you trip. What can  I do in the bedroom? Use night lights. Make sure that you have a light by your bed that is easy to reach. Do not use any sheets or blankets that are too big for your bed. They should not hang down onto the floor. Have a firm chair that has side arms. You can use this for support while you get dressed. Do not have throw rugs and other things on the floor that can make you trip. What can I do in the kitchen? Clean up any spills right away. Avoid walking on wet floors. Keep items that you use a lot in easy-to-reach places. If you need to reach something above you, use a strong step stool that has a grab bar. Keep electrical cords out of the way. Do not use floor polish or wax that makes floors slippery. If you must use wax, use non-skid floor wax. Do not have throw rugs and other things on the floor that can make you trip. What can I do with my stairs? Do not leave any items on the stairs. Make sure that there are handrails on both sides of the stairs and use them. Fix handrails that are broken or loose. Make sure that handrails are as long as the stairways. Check any carpeting to make sure  that it is firmly attached to the stairs. Fix any carpet that is loose or worn. Avoid having throw rugs at the top or bottom of the stairs. If you do have throw rugs, attach them to the floor with carpet tape. Make sure that you have a light switch at the top of the stairs and the bottom of the stairs. If you do not have them, ask someone to add them for you. What else can I do to help prevent falls? Wear shoes that: Do not have high heels. Have rubber bottoms. Are comfortable and fit you well. Are closed at the toe. Do not wear sandals. If you use a stepladder: Make sure that it is fully opened. Do not climb a closed stepladder. Make sure that both sides of the stepladder are locked into place. Ask someone to hold it for you, if possible. Clearly mark and make sure that you can see: Any grab bars or handrails. First and last steps. Where the edge of each step is. Use tools that help you move around (mobility aids) if they are needed. These include: Canes. Walkers. Scooters. Crutches. Turn on the lights when you go into a dark area. Replace any light bulbs as soon as they burn out. Set up your furniture so you have a clear path. Avoid moving your furniture around. If any of your floors are uneven, fix them. If there are any pets around you, be aware of where they are. Review your medicines with your doctor. Some medicines can make you feel dizzy. This can increase your chance of falling. Ask your doctor what other things that you can do to help prevent falls. This information is not intended to replace advice given to you by your health care provider. Make sure you discuss any questions you have with your health care provider. Document Released: 06/14/2009 Document Revised: 01/24/2016 Document Reviewed: 09/22/2014 Elsevier Interactive Patient Education  2017 [employer].

## 2023-02-12 NOTE — Progress Notes (Signed)
I connected with  ALEINE DURALL on 02/12/23 by a audio enabled telemedicine application and verified that I am speaking with the correct person using two identifiers.  Patient Location: Home  Provider Location: Office/Clinic  I discussed the limitations of evaluation and management by telemedicine. The patient expressed understanding and agreed to proceed.   Subjective:   JHANA LEFFALL is a 67 y.o. female who presents for an Initial Medicare Annual Wellness Visit.  Review of Systems     Cardiac Risk Factors include: advanced age (>71men, >23 women);obesity (BMI >30kg/m2);hypertension;dyslipidemia     Objective:    Today's Vitals   02/12/23 1132  Weight: 214 lb (97.1 kg)   Body mass index is 40.77 kg/m.     02/12/2023   11:37 AM 06/17/2022    2:19 PM 05/22/2022    6:53 AM 05/15/2022    2:18 PM 11/19/2021    4:00 PM 11/17/2021    4:18 AM 11/16/2021    2:32 PM  Advanced Directives  Does Patient Have a Medical Advance Directive? No No No No No  No  Does patient want to make changes to medical advance directive?  No - Patient declined       Would patient like information on creating a medical advance directive? No - Patient declined  No - Patient declined No - Patient declined No - Patient declined No - Patient declined     Current Medications (verified) Outpatient Encounter Medications as of 02/12/2023  Medication Sig   aspirin 81 MG chewable tablet Chew 1 tablet (81 mg total) by mouth 2 (two) times daily.   atorvastatin (LIPITOR) 80 MG tablet TAKE ONE TABLET BY MOUTH EVERY MORNING   chlorthalidone (HYGROTON) 25 MG tablet TAKE 1/2 TABLET BY MOUTH EVERY MORNING   gabapentin (NEURONTIN) 300 MG capsule TAKE 1 TO 2 CAPSULES BY MOUTH AT beditme AS DIRECTED   HYDROcodone-acetaminophen (NORCO/VICODIN) 5-325 MG tablet Take 1 tablet by mouth at bedtime as needed for moderate pain.   losartan (COZAAR) 50 MG tablet TAKE 1 TABLET(50 MG) BY MOUTH DAILY   Multiple  Vitamins-Minerals (MULTIVITAMIN WITH MINERALS) tablet Take 1 tablet by mouth daily.   ondansetron (ZOFRAN) 4 MG tablet Take 1 tablet (4 mg total) by mouth every 6 (six) hours as needed for nausea.   oxybutynin (DITROPAN) 5 MG tablet TAKE 1/2 TABLET BY MOUTH EVERY MORNING and TAKE 1/2 TABLET BY MOUTH EVERYDAY AT BEDTIME   [DISCONTINUED] methocarbamol (ROBAXIN) 500 MG tablet Take 1 tablet (500 mg total) by mouth every 6 (six) hours as needed for muscle spasms.   No facility-administered encounter medications on file as of 02/12/2023.    Allergies (verified) Patient has no known allergies.   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   Procedure: LEFT TOTAL KNEE ARTHROPLASTY;  Surgeon: [name], MD;  Location: [facility] OR;  Service: Orthopedics;  Laterality: Left;   TUBAL LIGATION  1994   Family History  Problem Relation Age of Onset   Arthritis  Mother    Cancer Maternal Grandmother    Diabetes Maternal Grandmother    Diabetes Maternal Grandfather    Healthy Daughter    Healthy Son    Social History   Socioeconomic History   Marital status: Divorced    Spouse name: Not on file   Number of children: 2   Years of education: Not on file   Highest education level: Some college, no degree  Occupational History   Not on file  Tobacco Use   Smoking status: Former    Packs/day: 1.00    Years: 45.00    Additional pack years: 0.00    Total pack years: 45.00    Types: Cigarettes    Start date: 06/01/1976    Quit date: 05/15/2022    Years since quitting: 0.7   Smokeless tobacco: Never  Vaping  Use   Vaping Use: Never used  Substance and Sexual Activity   Alcohol use: Not Currently   Drug use: Never   Sexual activity: Not Currently  Other Topics Concern   Not on file  Social History Narrative   Lives alone   Caffeine- coffee 1 -2 c daily   Social Determinants of Health   Financial Resource Strain: Low Risk  (02/12/2023)   Overall Financial Resource Strain (CARDIA)    Difficulty of Paying Living Expenses: Not hard at all  Food Insecurity: No Food Insecurity (02/12/2023)   Hunger Vital Sign    Worried About Running Out of Food in the Last Year: Never true    Ran Out of Food in the Last Year: Never true  Transportation Needs: No Transportation Needs (02/12/2023)   PRAPARE - Administrator, Civil Service (Medical): No    Lack of Transportation (Non-Medical): No  Physical Activity: Sufficiently Active (02/12/2023)   Exercise Vital Sign    Days of Exercise per Week: 7 days    Minutes of Exercise per Session: 30 min  Stress: No Stress Concern Present (02/12/2023)   [employer] of Occupational Health - Occupational Stress Questionnaire    Feeling of Stress : Not at all  Social Connections: Socially Isolated (02/12/2023)   Social Connection and Isolation Panel [NHANES]    Frequency of Communication with Friends and Family: More than three times a week    Frequency of Social Gatherings with Friends and Family: More than three times a week    Attends Religious Services: Never    Database administrator or Organizations: No    Attends Engineer, structural: Never    Marital Status: Divorced    Tobacco Counseling Counseling given: Not Answered   Clinical Intake:  Pre-visit preparation completed: Yes  Pain : No/denies pain     BMI - recorded: 40.77 Nutritional Status: BMI > 30  Obese Nutritional Risks: None Diabetes: No  How often do you need to have someone help you when you read instructions, pamphlets, or other written materials from your  doctor or pharmacy?: 1 - Never  Diabetic?no  Interpreter Needed?: No  Information entered by :: Lanier Ensign, LPN   Activities of Daily Living    02/12/2023   11:39 AM 05/15/2022    2:21 PM  In your present state of health, do you have any difficulty performing the following activities:  Hearing? 1   Comment left ear HOH   Vision? 0   Difficulty concentrating or making decisions? 0   Walking or climbing stairs? 0   Dressing or bathing? 0   Doing errands,  shopping? 0 0  Preparing Food and eating ? N   Using the Toilet? N   In the past six months, have you accidently leaked urine? Y   Comment wears briefs   Do you have problems with loss of bowel control? N   Managing your Medications? N   Managing your Finances? N   Housekeeping or managing your Housekeeping? N     Patient Care Team: Allwardt, Crist Infante, PA-C as PCP - General (Physician Assistant) [name], MD as PCP - Cardiology (Cardiology) Erroll Luna, [facility] as Pharmacist (Pharmacist)  Indicate any recent Medical Services you may have received from other than [facility] providers in the past year (date may be approximate).     Assessment:   This is a routine wellness examination for Jax.  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  Vision Screening - Comments:: Pt follows up with [facility] for annual eye exams   Dietary issues and exercise activities discussed: Current Exercise Habits: Home exercise routine, Type of exercise: walking, Time (Minutes): 30, Frequency (Times/Week): 7, Weekly Exercise (Minutes/Week): 210   Goals Addressed             This Visit's Progress    Patient Stated       Get better mobility in knee        Depression Screen    02/12/2023   11:36 AM 05/13/2022   11:35 AM 12/16/2021   11:06 AM 12/05/2021    1:19 PM 05/27/2021   10:56 AM  PHQ 2/9 Scores  PHQ - 2 Score 0 0 0 0 0  PHQ- 9 Score  3       Fall Risk    02/12/2023    11:38 AM 05/13/2022   11:34 AM 01/16/2022   12:09 PM 12/05/2021    1:19 PM 10/15/2020   10:07 AM  Fall Risk   Falls in the past year? 0 1 1 0 1  Number falls in past yr: 0 0 0 0 0  Injury with Fall? 0 0 0 0 1  Comment   Bruises    Risk for fall due to : Impaired vision;Impaired mobility  No Fall Risks No Fall Risks History of fall(s)  Risk for fall due to: Comment related to knees      Follow up Falls prevention discussed  Falls evaluation completed Falls evaluation completed Falls evaluation completed    FALL RISK PREVENTION PERTAINING TO THE HOME:  Any stairs in or around the home? Yes  If so, are there any without handrails? No  Home free of loose throw rugs in walkways, pet beds, electrical cords, etc? Yes  Adequate lighting in your home to reduce risk of falls? Yes   ASSISTIVE DEVICES UTILIZED TO PREVENT FALLS:  Life alert? No  Use of a cane, walker or w/c? No  Grab bars in the bathroom? No  Shower chair or bench in shower? No  Elevated toilet seat or a handicapped toilet? No   TIMED UP AND GO:  Was the test performed? No .   Cognitive Function:        02/12/2023   11:39 AM  6CIT Screen  What Year? 0 points  What month? 0 points  What time? 0 points  Count back from 20 0 points  Months in reverse 0 points  Repeat phrase 0 points  Total Score 0 points    Immunizations Immunization History  Administered Date(s) Administered   Fluad Quad(high Dose  65+) 06/01/2022   Influenza-Unspecified 05/13/2021   Moderna Sars-Covid-2 Vaccination 11/14/2019, 12/12/2019   PNEUMOCOCCAL CONJUGATE-20 11/17/2021    TDAP status: Due, Education has been provided regarding the importance of this vaccine. Advised may receive this vaccine at local pharmacy or Health Dept. Aware to provide a copy of the vaccination record if obtained from local pharmacy or Health Dept. Verbalized acceptance and understanding.  Flu Vaccine status: Up to date  Pneumococcal vaccine status: Up to  date  Covid-19 vaccine status: Completed vaccines  Qualifies for Shingles Vaccine? Yes   Zostavax completed No   Shingrix Completed?: No.    Education has been provided regarding the importance of this vaccine. Patient has been advised to call insurance company to determine out of pocket expense if they have not yet received this vaccine. Advised may also receive vaccine at local pharmacy or Health Dept. Verbalized acceptance and understanding.  Screening Tests Health Maintenance  Topic Date Due   DTaP/Tdap/Td (1 - Tdap) Never done   Zoster Vaccines- Shingrix (1 of 2) Never done   Colonoscopy  Never done   Hepatitis C Screening  01/21/2024 (Originally 01/28/1974)   INFLUENZA VACCINE  04/02/2023   MAMMOGRAM  06/17/2023   Lung Cancer Screening  08/12/2023   Medicare Annual Wellness (AWV)  02/12/2024   Pneumonia Vaccine 64+ Years old  Completed   DEXA SCAN  Completed   HPV VACCINES  Aged Out   COVID-19 Vaccine  Discontinued    Health Maintenance  Health Maintenance Due  Topic Date Due   DTaP/Tdap/Td (1 - Tdap) Never done   Zoster Vaccines- Shingrix (1 of 2) Never done   Colonoscopy  Never done    Pt postponed colonoscopy   Mammogram status: Completed 06/16/22. Repeat every year  Bone Density status: Completed 06/12/21. Results reflect: Bone density results: OSTEOPENIA. Repeat every 2 years.  Lung Cancer Screening: (Low Dose CT Chest recommended if Age 44-80 years, 30 pack-year currently smoking OR have quit w/in 15years.) does qualify.   Lung Cancer Screening Referral: last completed 08/11/22  Additional Screening:  Hepatitis C Screening: does qualify  Vision Screening: Recommended annual ophthalmology exams for early detection of glaucoma and other disorders of the eye. Is the patient up to date with their annual eye exam?  No  Who is the provider or what is the name of the office in which the patient attends annual eye exams? [facility]  If pt is not  established with a provider, would they like to be referred to a provider to establish care? No .   Dental Screening: Recommended annual dental exams for proper oral hygiene  Community Resource Referral / Chronic Care Management: CRR required this visit?  No   CCM required this visit?  No      Plan:     I have personally reviewed and noted the following in the patient's chart:   Medical and social history Use of alcohol, tobacco or illicit drugs  Current medications and supplements including opioid prescriptions. Patient is currently taking opioid prescriptions. Information provided to patient regarding non-opioid alternatives. Patient advised to discuss non-opioid treatment plan with their provider. Functional ability and status Nutritional status Physical activity Advanced directives List of other physicians Hospitalizations, surgeries, and ER visits in previous 12 months Vitals Screenings to include cognitive, depression, and falls Referrals and appointments  In addition, I have reviewed and discussed with patient certain preventive protocols, quality metrics, and best practice recommendations. A written personalized care plan for preventive services as well  as general preventive health recommendations were provided to patient.     Marzella Schlein, LPN   [id number]   Nurse Notes: none

## 2023-02-20 DIAGNOSIS — H26492 Other secondary cataract, left eye: Secondary | ICD-10-CM | POA: Diagnosis not present

## 2023-02-20 DIAGNOSIS — Z961 Presence of intraocular lens: Secondary | ICD-10-CM | POA: Diagnosis not present

## 2023-02-20 DIAGNOSIS — H18831 Recurrent erosion of cornea, right eye: Secondary | ICD-10-CM | POA: Diagnosis not present

## 2023-02-24 ENCOUNTER — Encounter: Payer: Self-pay | Admitting: Physician Assistant

## 2023-02-24 ENCOUNTER — Encounter: Payer: Medicare Other | Admitting: Pharmacist

## 2023-02-24 ENCOUNTER — Ambulatory Visit (INDEPENDENT_AMBULATORY_CARE_PROVIDER_SITE_OTHER): Payer: Medicare Other | Admitting: Physician Assistant

## 2023-02-24 VITALS — BP 112/80 | HR 94 | Temp 97.6°F | Ht 60.0 in | Wt 217.2 lb

## 2023-02-24 DIAGNOSIS — N3281 Overactive bladder: Secondary | ICD-10-CM | POA: Diagnosis not present

## 2023-02-24 DIAGNOSIS — R7303 Prediabetes: Secondary | ICD-10-CM | POA: Insufficient documentation

## 2023-02-24 DIAGNOSIS — M170 Bilateral primary osteoarthritis of knee: Secondary | ICD-10-CM | POA: Diagnosis not present

## 2023-02-24 DIAGNOSIS — I10 Essential (primary) hypertension: Secondary | ICD-10-CM

## 2023-02-24 LAB — POCT GLYCOSYLATED HEMOGLOBIN (HGB A1C): Hemoglobin A1C: 5.8 % — AB (ref 4.0–5.6)

## 2023-02-24 NOTE — Assessment & Plan Note (Signed)
Lab Results  Component Value Date   HGBA1C 5.8 (A) 02/24/2023   HGBA1C 6.1 05/06/2022   HGBA1C 5.8 (H) 11/17/2021   Improved! Encouraged good lifestyle.

## 2023-02-24 NOTE — Assessment & Plan Note (Signed)
Continues f/up with her specialists Mostly having issues in R knee now, but managing overall

## 2023-02-24 NOTE — Assessment & Plan Note (Signed)
Normotensive Cont Losartan 50 mg and chlorthalidone 12.5 mg daily Monitor readings at home  

## 2023-02-24 NOTE — Progress Notes (Signed)
Subjective:    Patient ID: Veronica Weber, female    DOB: 16-Jan-1956, 67 y.o.   MRN: [id number]  Chief Complaint  Patient presents with   Medical Management of Chronic Issues   Hypertension    Hypertension   Patient is in today for 6 mo f/up visit.   Interim hx: Following with eye specialist about corneal ulcer; healing well per pt.  She has healed well from L knee surgery. Anticipating surgery for R ankle and knee in the future.   No new health concerns today. Mom came in for a visit to say hi as well, good to meet her.    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   Procedure: LEFT TOTAL KNEE ARTHROPLASTY;  Surgeon: [name], MD;  Location: [facility] OR;  Service: Orthopedics;  Laterality: Left;   TUBAL LIGATION  1994    Family History  Problem Relation Age of Onset   Arthritis Mother    Cancer Maternal Grandmother    Diabetes Maternal Grandmother    Diabetes Maternal Grandfather    Healthy Daughter    Healthy Son     Social History   Tobacco Use   Smoking status: Former    Packs/day: 1.00    Years: 45.00    Additional pack years: 0.00    Total pack years: 45.00    Types: Cigarettes    Start date: 06/01/1976    Quit date: 05/15/2022    Years since quitting: 0.7   Smokeless tobacco: Never  Vaping Use   Vaping Use: Never used  Substance Use Topics   Alcohol use: Not Currently   Drug use: Never     No Known Allergies  Review of Systems NEGATIVE UNLESS OTHERWISE INDICATED IN HPI      Objective:     BP 112/80   Pulse 94   Temp 97.6 F (36.4 C)   Ht  5' (1.524 m)   Wt 217 lb 3.2 oz (98.5 kg)   SpO2 95%   BMI 42.42 kg/m   Wt Readings from Last 3 Encounters:  02/24/23 217 lb 3.2 oz (98.5 kg)  02/12/23 214 lb (97.1 kg)  02/09/23 214 lb 12.8 oz (97.4 kg)    BP Readings from Last 3 Encounters:  02/24/23 112/80  01/21/23 128/84  08/26/22 112/70     Physical Exam Vitals and nursing note reviewed.  Constitutional:      Appearance: Normal appearance. She is obese. She is not toxic-appearing.  HENT:     Head: Normocephalic and atraumatic.  Eyes:     Extraocular Movements: Extraocular movements intact.     Conjunctiva/sclera: Conjunctivae normal.     Pupils: Pupils are equal, round, and reactive to light.  Cardiovascular:     Rate and Rhythm: Normal rate and regular rhythm.     Pulses: Normal pulses.     Heart sounds: Normal heart sounds.  Pulmonary:     Effort: Pulmonary effort is normal.     Breath  sounds: Normal breath sounds.  Musculoskeletal:        General: Normal range of motion.     Cervical back: Normal range of motion and neck supple.  Skin:    General: Skin is warm and dry.  Neurological:     General: No focal deficit present.     Mental Status: She is alert and oriented to person, place, and time.  Psychiatric:        Mood and Affect: Mood normal.        Behavior: Behavior normal.        Assessment & Plan:  Essential hypertension Assessment & Plan: Normotensive Cont Losartan 50 mg and chlorthalidone 12.5 mg daily Monitor readings at home    Osteoarthritis of both knees, unspecified osteoarthritis type Assessment & Plan: Continues f/up with her specialists Mostly having issues in R knee now, but managing overall    Overactive bladder Assessment & Plan: Ditropan 2.5 mg BID working good overall Will continue at this dose for now    Prediabetes Assessment & Plan: Lab Results  Component Value Date   HGBA1C 5.8 (A) 02/24/2023   HGBA1C 6.1 05/06/2022   HGBA1C 5.8 (H) 11/17/2021   Improved!  Encouraged good lifestyle.   Orders: -     POCT glycosylated hemoglobin (Hb A1C)        Return in about 6 months (around 08/26/2023) for recheck/follow-up.    Anyelin Mogle M Lenoard Helbert, PA-C

## 2023-02-24 NOTE — Assessment & Plan Note (Signed)
Ditropan 2.5 mg BID working good overall Will continue at this dose for now

## 2023-02-24 NOTE — Patient Instructions (Signed)
Great work, Occupational psychologist!!! Have a good summer! Keep working on health goals!

## 2023-02-25 ENCOUNTER — Other Ambulatory Visit: Payer: Self-pay | Admitting: Physician Assistant

## 2023-03-26 ENCOUNTER — Other Ambulatory Visit: Payer: Self-pay | Admitting: Physician Assistant

## 2023-03-30 DIAGNOSIS — H26492 Other secondary cataract, left eye: Secondary | ICD-10-CM | POA: Diagnosis not present

## 2023-03-30 DIAGNOSIS — Z961 Presence of intraocular lens: Secondary | ICD-10-CM | POA: Diagnosis not present

## 2023-03-30 DIAGNOSIS — H524 Presbyopia: Secondary | ICD-10-CM | POA: Diagnosis not present

## 2023-04-07 ENCOUNTER — Other Ambulatory Visit: Payer: Self-pay | Admitting: Physician Assistant

## 2023-04-19 ENCOUNTER — Other Ambulatory Visit: Payer: Self-pay | Admitting: Physician Assistant

## 2023-05-08 ENCOUNTER — Other Ambulatory Visit: Payer: Self-pay | Admitting: Physician Assistant

## 2023-05-14 ENCOUNTER — Other Ambulatory Visit: Payer: Self-pay | Admitting: Physical Medicine and Rehabilitation

## 2023-06-05 ENCOUNTER — Other Ambulatory Visit: Payer: Self-pay | Admitting: Physician Assistant

## 2023-06-05 DIAGNOSIS — Z1211 Encounter for screening for malignant neoplasm of colon: Secondary | ICD-10-CM

## 2023-06-05 DIAGNOSIS — Z1212 Encounter for screening for malignant neoplasm of rectum: Secondary | ICD-10-CM

## 2023-06-16 ENCOUNTER — Other Ambulatory Visit: Payer: Self-pay | Admitting: Physician Assistant

## 2023-07-13 ENCOUNTER — Telehealth: Payer: Self-pay | Admitting: Physician Assistant

## 2023-07-13 NOTE — Telephone Encounter (Signed)
Error

## 2023-08-27 ENCOUNTER — Encounter: Payer: Self-pay | Admitting: Physician Assistant

## 2023-08-27 ENCOUNTER — Ambulatory Visit (INDEPENDENT_AMBULATORY_CARE_PROVIDER_SITE_OTHER): Payer: Medicare Other | Admitting: Physician Assistant

## 2023-08-27 ENCOUNTER — Ambulatory Visit: Payer: Medicare Other | Admitting: Physician Assistant

## 2023-08-27 VITALS — BP 130/68 | HR 99 | Temp 98.0°F | Ht 60.0 in | Wt 239.4 lb

## 2023-08-27 DIAGNOSIS — E7800 Pure hypercholesterolemia, unspecified: Secondary | ICD-10-CM

## 2023-08-27 DIAGNOSIS — H9193 Unspecified hearing loss, bilateral: Secondary | ICD-10-CM | POA: Diagnosis not present

## 2023-08-27 DIAGNOSIS — H7293 Unspecified perforation of tympanic membrane, bilateral: Secondary | ICD-10-CM

## 2023-08-27 DIAGNOSIS — H6693 Otitis media, unspecified, bilateral: Secondary | ICD-10-CM

## 2023-08-27 DIAGNOSIS — N3281 Overactive bladder: Secondary | ICD-10-CM

## 2023-08-27 DIAGNOSIS — Z23 Encounter for immunization: Secondary | ICD-10-CM

## 2023-08-27 DIAGNOSIS — R2689 Other abnormalities of gait and mobility: Secondary | ICD-10-CM | POA: Diagnosis not present

## 2023-08-27 DIAGNOSIS — G4710 Hypersomnia, unspecified: Secondary | ICD-10-CM | POA: Diagnosis not present

## 2023-08-27 MED ORDER — MIRABEGRON ER 25 MG PO TB24: 25.0000 mg | ORAL_TABLET | Freq: Every day | ORAL | 2 refills | Status: DC

## 2023-08-27 MED ORDER — GABAPENTIN 300 MG PO CAPS: ORAL_CAPSULE | ORAL | 1 refills | Status: DC

## 2023-08-27 MED ORDER — AMOXICILLIN-POT CLAVULANATE 875-125 MG PO TABS: 1.0000 | ORAL_TABLET | Freq: Two times a day (BID) | ORAL | 0 refills | Status: DC
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
## 2023-08-27 NOTE — Patient Instructions (Addendum)
Labs today  Take Augmentin as directed for likely ear infections. A referral has been sent to ENT as well and they should call you.   Flu shot updated today.  I will work on looking for a change in med for overactive bladder.  2 weeks f/up for me to see you back about your ears.

## 2023-08-28 LAB — CBC WITH DIFFERENTIAL/PLATELET
Basophils Absolute: 0.1 10*3/uL (ref 0.0–0.1)
Basophils Relative: 1.1 % (ref 0.0–3.0)
Eosinophils Absolute: 0.2 10*3/uL (ref 0.0–0.7)
Eosinophils Relative: 3.8 % (ref 0.0–5.0)
HCT: 39.7 % (ref 36.0–46.0)
Hemoglobin: 13.4 g/dL (ref 12.0–15.0)
Lymphocytes Relative: 28.5 % (ref 12.0–46.0)
Lymphs Abs: 1.9 10*3/uL (ref 0.7–4.0)
MCHC: 33.8 g/dL (ref 30.0–36.0)
MCV: 94.4 fL (ref 78.0–100.0)
Monocytes Absolute: 0.4 10*3/uL (ref 0.1–1.0)
Monocytes Relative: 6.8 % (ref 3.0–12.0)
Neutro Abs: 3.9 10*3/uL (ref 1.4–7.7)
Neutrophils Relative %: 59.8 % (ref 43.0–77.0)
Platelets: 346 10*3/uL (ref 150.0–400.0)
RBC: 4.2 Mil/uL (ref 3.87–5.11)
RDW: 12.7 % (ref 11.5–15.5)
WBC: 6.5 10*3/uL (ref 4.0–10.5)

## 2023-08-28 LAB — LIPID PANEL
Cholesterol: 157 mg/dL (ref 0–200)
HDL: 55.5 mg/dL (ref >39.00)
LDL Cholesterol: 61 mg/dL (ref 0–99)
NonHDL: 101.25
Total CHOL/HDL Ratio: 3
Triglycerides: 201 mg/dL — ABNORMAL HIGH (ref 0.0–149.0)
VLDL: 40.2 mg/dL — ABNORMAL HIGH (ref 0.0–40.0)

## 2023-08-28 LAB — COMPREHENSIVE METABOLIC PANEL
ALT: 32 U/L (ref 0–35)
AST: 33 U/L (ref 0–37)
Albumin: 4 g/dL (ref 3.5–5.2)
Alkaline Phosphatase: 155 U/L — ABNORMAL HIGH (ref 39–117)
BUN: 16 mg/dL (ref 6–23)
CO2: 31 meq/L (ref 19–32)
Calcium: 10.4 mg/dL (ref 8.4–10.5)
Chloride: 103 meq/L (ref 96–112)
Creatinine, Ser: 0.85 mg/dL (ref 0.40–1.20)
GFR: 70.81 mL/min (ref >60.00)
Glucose, Bld: 100 mg/dL — ABNORMAL HIGH (ref 70–99)
Potassium: 3.7 meq/L (ref 3.5–5.1)
Sodium: 141 meq/L (ref 135–145)
Total Bilirubin: 0.4 mg/dL (ref 0.2–1.2)
Total Protein: 7 g/dL (ref 6.0–8.3)

## 2023-08-28 LAB — VITAMIN B12: Vitamin B-12: 461 pg/mL (ref 211–911)

## 2023-08-28 LAB — HEMOGLOBIN A1C: Hgb A1c MFr Bld: 6.1 % (ref 4.6–6.5)

## 2023-08-28 LAB — VITAMIN D 25 HYDROXY (VIT D DEFICIENCY, FRACTURES): VITD: 26.27 ng/mL — ABNORMAL LOW (ref 30.00–100.00)

## 2023-08-28 LAB — TSH: TSH: 6.14 u[IU]/mL — ABNORMAL HIGH (ref 0.35–5.50)

## 2023-09-03 ENCOUNTER — Telehealth: Payer: Self-pay | Admitting: Physician Assistant

## 2023-09-03 NOTE — Telephone Encounter (Unsigned)
 Copied from CRM [id number]. Topic: Appointments - Scheduling Error >> Sep 03, 2023  3:09 PM [name] wrote: Patient stated that Veronica Weber wanted patient to come back into the office by 09/11/23 to recheck her ears after the infection she had. Nothing is available until 09/22/23. Please let patient know if this can be a nurse visit or if Veronica wants her worked in earlier as patient is not wanting to schedule for 1/21.

## 2023-09-04 NOTE — Telephone Encounter (Signed)
 Please see patient message regarding appointment and advise if wanting to add sooner than available appointment

## 2023-09-04 NOTE — Telephone Encounter (Signed)
 Please call and schedule patient per PCP Monday at 11am

## 2023-09-04 NOTE — Telephone Encounter (Signed)
 Patient scheduled for Monday 11am as requested

## 2023-09-07 ENCOUNTER — Ambulatory Visit: Payer: Medicare Other | Admitting: Physician Assistant

## 2023-09-09 ENCOUNTER — Encounter: Payer: Self-pay | Admitting: Physician Assistant

## 2023-09-09 ENCOUNTER — Ambulatory Visit: Payer: Medicare Other | Admitting: Physician Assistant

## 2023-09-09 VITALS — BP 130/78 | HR 92 | Temp 97.3°F | Ht 60.0 in

## 2023-09-09 DIAGNOSIS — G4710 Hypersomnia, unspecified: Secondary | ICD-10-CM

## 2023-09-09 DIAGNOSIS — R197 Diarrhea, unspecified: Secondary | ICD-10-CM | POA: Diagnosis not present

## 2023-09-09 DIAGNOSIS — E039 Hypothyroidism, unspecified: Secondary | ICD-10-CM

## 2023-09-09 DIAGNOSIS — H9193 Unspecified hearing loss, bilateral: Secondary | ICD-10-CM

## 2023-09-09 DIAGNOSIS — M545 Low back pain, unspecified: Secondary | ICD-10-CM

## 2023-09-09 DIAGNOSIS — R748 Abnormal levels of other serum enzymes: Secondary | ICD-10-CM | POA: Diagnosis not present

## 2023-09-09 DIAGNOSIS — H6690 Otitis media, unspecified, unspecified ear: Secondary | ICD-10-CM

## 2023-09-09 LAB — TSH: TSH: 7.68 u[IU]/mL — ABNORMAL HIGH (ref 0.35–5.50)

## 2023-09-09 LAB — GAMMA GT: GGT: 111 U/L — ABNORMAL HIGH (ref 7–51)

## 2023-09-09 LAB — COMPREHENSIVE METABOLIC PANEL
ALT: 35 U/L (ref 0–35)
AST: 34 U/L (ref 0–37)
Albumin: 3.9 g/dL (ref 3.5–5.2)
Alkaline Phosphatase: 140 U/L — ABNORMAL HIGH (ref 39–117)
BUN: 18 mg/dL (ref 6–23)
CO2: 31 meq/L (ref 19–32)
Calcium: 10.2 mg/dL (ref 8.4–10.5)
Chloride: 102 meq/L (ref 96–112)
Creatinine, Ser: 0.78 mg/dL (ref 0.40–1.20)
GFR: 78.49 mL/min (ref >60.00)
Glucose, Bld: 99 mg/dL (ref 70–99)
Potassium: 4.1 meq/L (ref 3.5–5.1)
Sodium: 141 meq/L (ref 135–145)
Total Bilirubin: 0.6 mg/dL (ref 0.2–1.2)
Total Protein: 6.6 g/dL (ref 6.0–8.3)

## 2023-09-09 LAB — T3, FREE: T3, Free: 3.2 pg/mL (ref 2.3–4.2)

## 2023-09-09 LAB — T4, FREE: Free T4: 0.92 ng/dL (ref 0.60–1.60)
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
## 2023-09-09 NOTE — Patient Instructions (Signed)
 VISIT SUMMARY:  During today's visit, we discussed your ongoing lower back pain, recent ear infection, gastrointestinal issues, and thyroid  concerns. We also reviewed your general health maintenance and made plans for further evaluation and treatment.  YOUR PLAN:  -OTITIS MEDIA: Otitis Media is an infection or inflammation of the middle ear. Your ear infection has improved with Augmentin , but there is still some redness and irritation. Please keep your ENT appointment on 10/05/2023 for further evaluation.  -GASTROINTESTINAL ISSUES: You have been experiencing diarrhea since starting Augmentin , which is a common side effect. Start taking Activia twice daily for a week. If your symptoms do not improve or worsen, please contact our office.  -LOWER BACK PAIN: Your chronic lower back pain, which worsens with standing and walking, may be related to your history of a compression fracture. If the pain persists, we may refer you to orthopedics or sports medicine for further evaluation.  -HYPOTHYROIDISM: Hypothyroidism is when your thyroid  gland does not produce enough thyroid  hormone. Your previous test showed slightly elevated TSH levels. We will repeat the thyroid  function test today, and if the levels are still elevated, we will start you on thyroid  replacement therapy.  -LIVER FUNCTION: Your previous liver function test showed slightly elevated alkaline phosphatase levels. We will repeat the liver function test today, and if the levels are still elevated, we will investigate further.  -GENERAL HEALTH MAINTENANCE: We will continue monitoring your prediabetes with lifestyle modifications. Start taking Vitamin D  supplements for your slightly low levels. Complete the Cologuard test for colorectal cancer screening and consider a low-dose chest CT for lung cancer screening. We will also request a renewal of your handicap placard.  INSTRUCTIONS:  Please keep your ENT appointment on 10/05/2023 for further  evaluation of your ear infection. If your gastrointestinal symptoms do not improve or worsen after taking Activia for a week, contact our office. We will repeat your thyroid  function and liver function tests today. Complete the Cologuard test for colorectal cancer screening and consider scheduling a low-dose chest CT for lung cancer screening.

## 2023-09-11 ENCOUNTER — Other Ambulatory Visit: Payer: Self-pay | Admitting: Physician Assistant

## 2023-09-11 MED ORDER — LEVOTHYROXINE SODIUM 25 MCG PO TABS: 25.0000 ug | ORAL_TABLET | Freq: Every day | ORAL | 1 refills | Status: DC

## 2023-09-15 ENCOUNTER — Other Ambulatory Visit: Payer: Self-pay

## 2023-09-15 DIAGNOSIS — R748 Abnormal levels of other serum enzymes: Secondary | ICD-10-CM

## 2023-09-17 ENCOUNTER — Ambulatory Visit (HOSPITAL_BASED_OUTPATIENT_CLINIC_OR_DEPARTMENT_OTHER)
Admission: RE | Admit: 2023-09-17 | Discharge: 2023-09-17 | Disposition: A | Discharge status: Home / Self Care | Payer: Medicare Other | Source: Ambulatory Visit | Attending: Physician Assistant | Admitting: Physician Assistant

## 2023-09-17 DIAGNOSIS — R748 Abnormal levels of other serum enzymes: Secondary | ICD-10-CM | POA: Insufficient documentation

## 2023-09-17 DIAGNOSIS — R7989 Other specified abnormal findings of blood chemistry: Secondary | ICD-10-CM | POA: Diagnosis not present

## 2023-09-17 DIAGNOSIS — Z9049 Acquired absence of other specified parts of digestive tract: Secondary | ICD-10-CM | POA: Diagnosis not present

## 2023-09-17 DIAGNOSIS — K760 Fatty (change of) liver, not elsewhere classified: Secondary | ICD-10-CM | POA: Diagnosis not present

## 2023-09-20 ENCOUNTER — Encounter: Payer: Self-pay | Admitting: Physician Assistant

## 2023-09-26 IMAGING — CT CT HEAD W/O CM
4 series · 15 of 47 positions shown, 17 images · non-contrast
Comparison: None.

CLINICAL DATA: Neuro deficit, acute, stroke suspected



[Series 3: head without · axial · non-contrast · 0.46mm/px · z∈[-77,+43]mm · 7 of 32 slices shown, 9 images]
[im 4/32  brain]
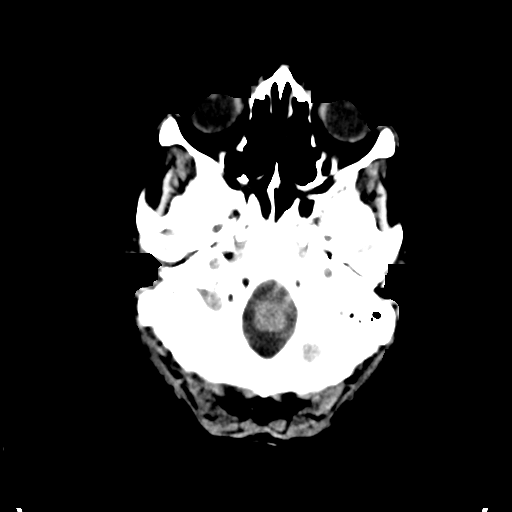
[im 4/32  bone]
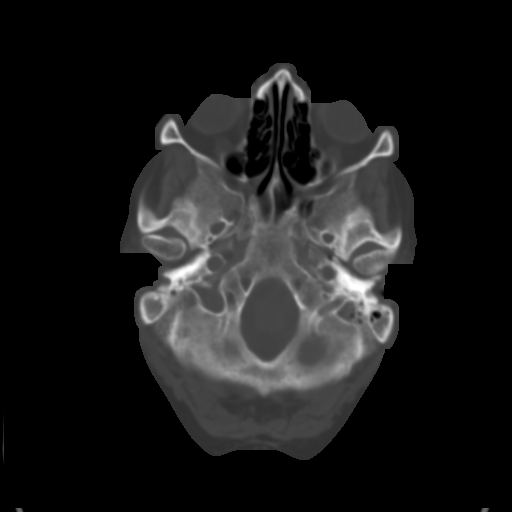
[im 8/32  brain]
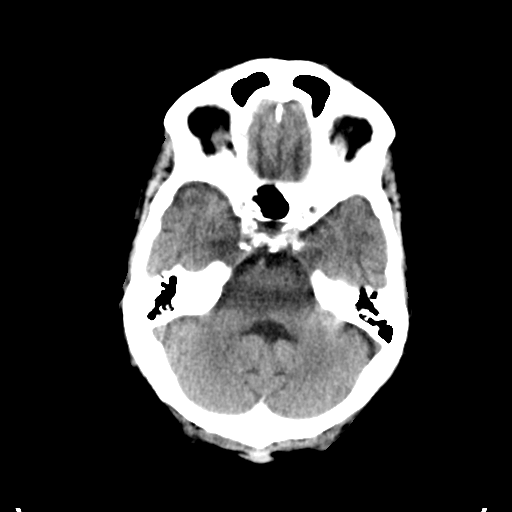
[im 12/32  brain]
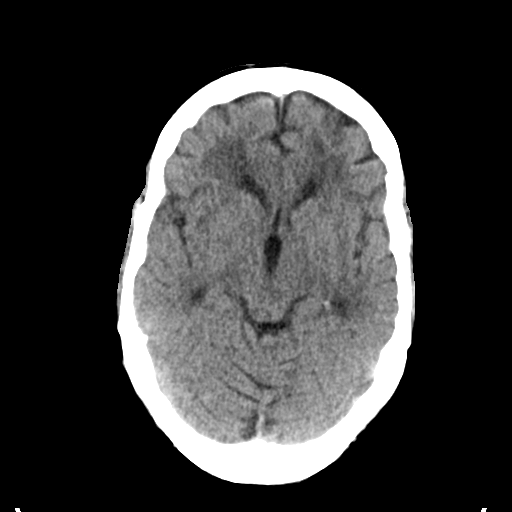
[im 16/32  brain]
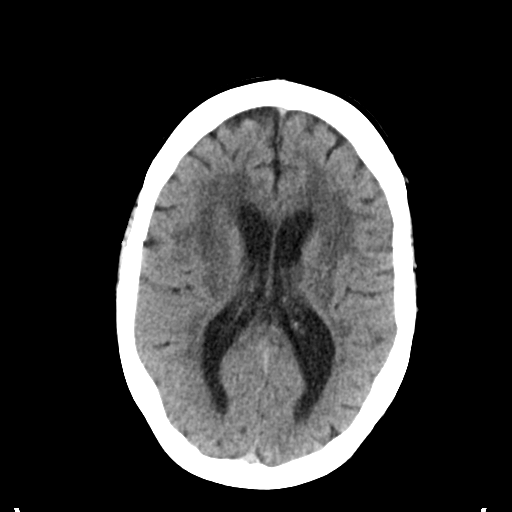
[im 20/32  brain]
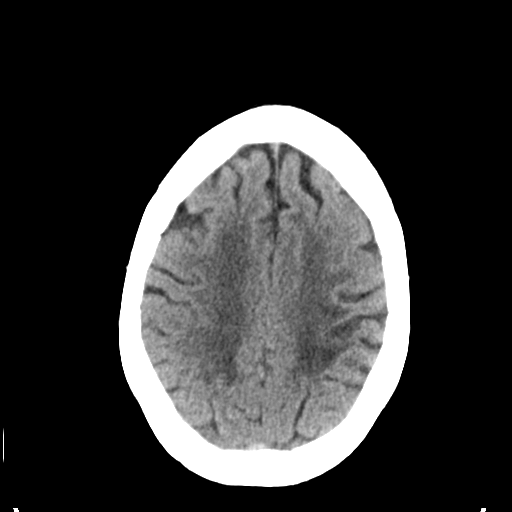
[im 20/32  bone]
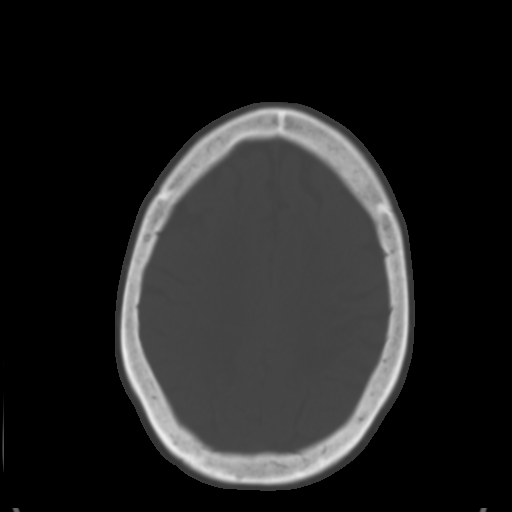
[im 24/32  brain]
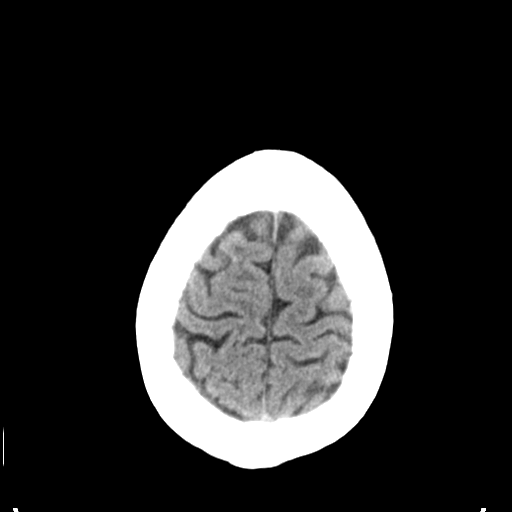
[im 28/32  brain]
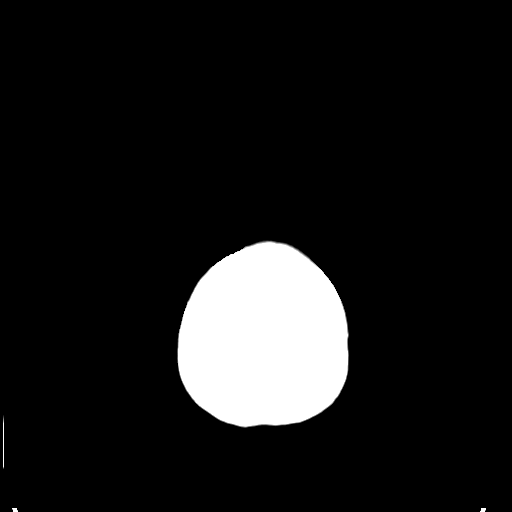

[Series 4: head bone · axial · 0.46mm/px · z∈[-78,-62]mm · 2 of 79 slices shown]
[im 8/79  bone]
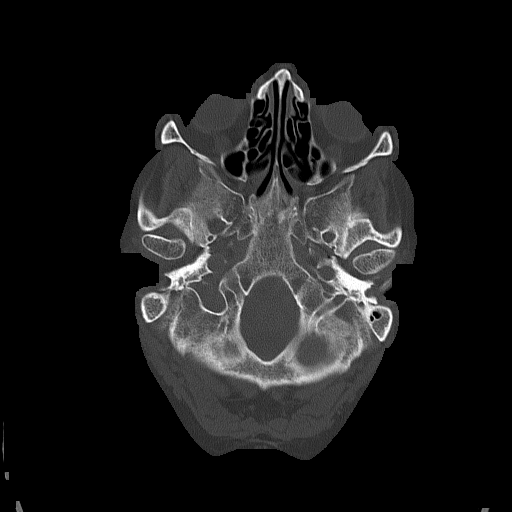
[im 16/79  bone]
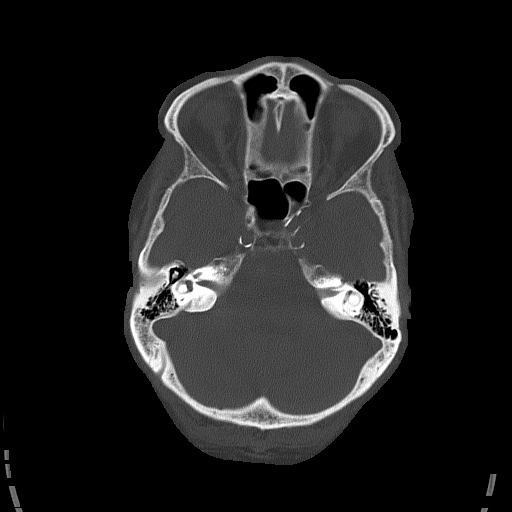

[Series 5: head without cor · coronal · non-contrast · 0.31mm/px · 3 of 66 slices shown]
[im 22/66  brain]
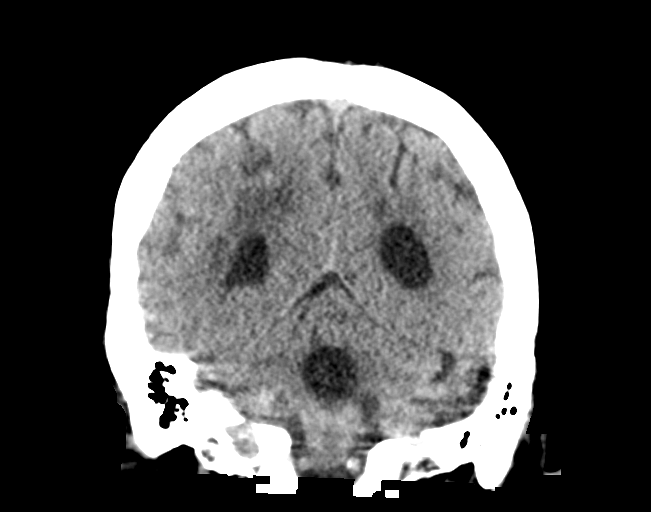
[im 29/66  brain]
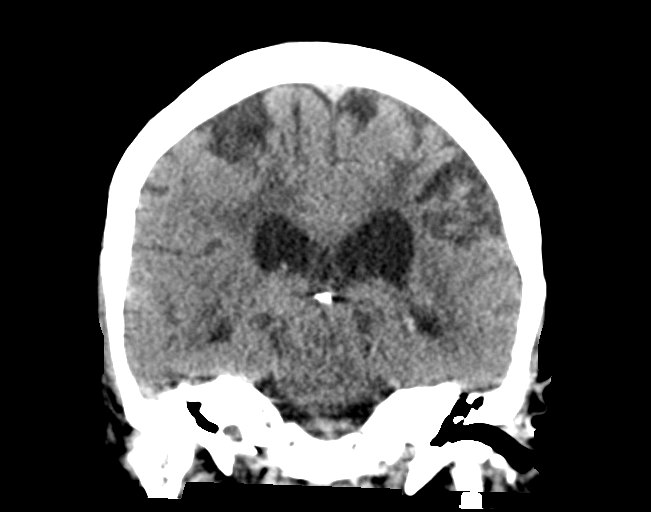
[im 37/66  brain]
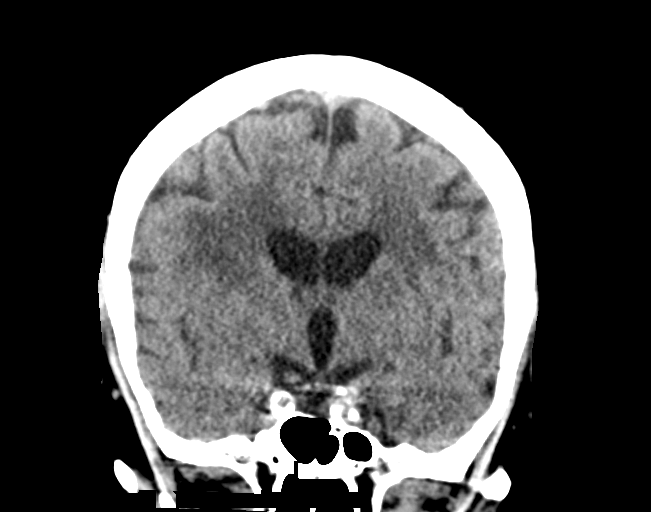

[Series 6: head without sag · sagittal · non-contrast · 0.31mm/px · 3 of 48 slices shown]
[im 16/48  brain]
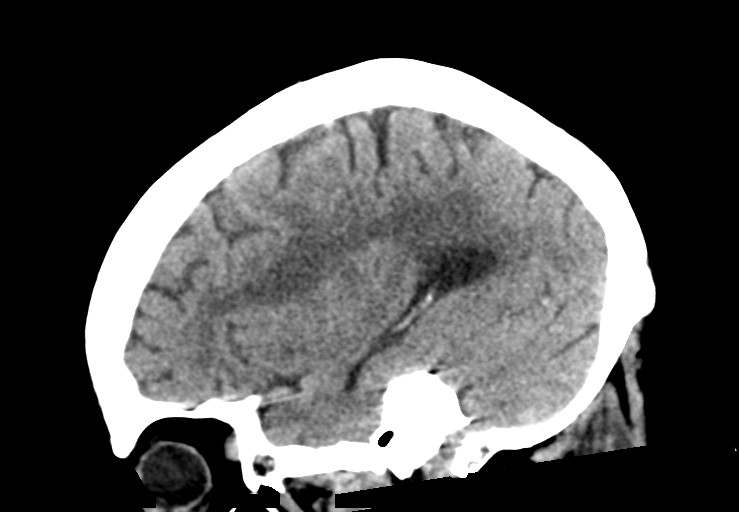
[im 24/48  brain]
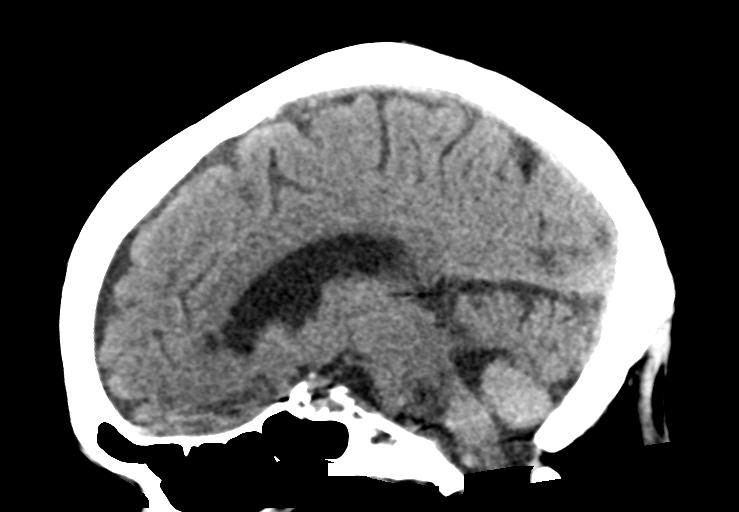
[im 32/48  brain]
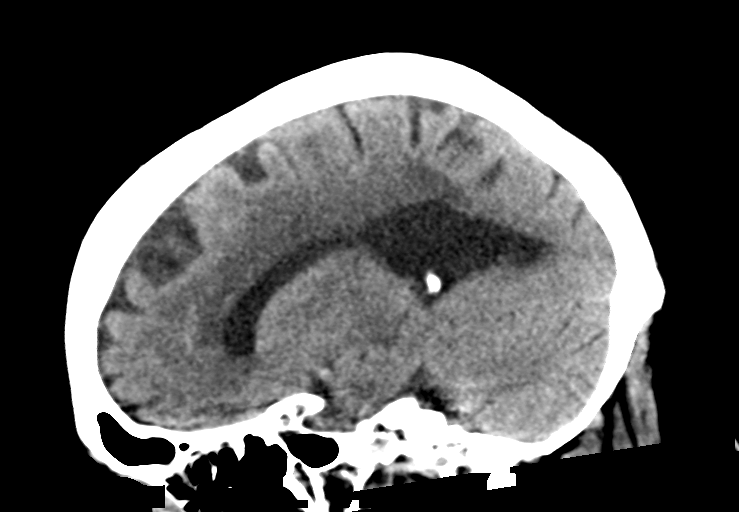

[15 of 47 positions shown; findings below may reference images not displayed]

FINDINGS: Brain: Brain volume is normal for age. No acute intracranial
hemorrhage or subdural collection. There is advanced periventricular
and deep white matter hypodensity with small area of focal
encephalomalacia in the posterior left frontal/parietal lobe. No
hydrocephalus, midline shift, or mass effect.

Vascular: Atherosclerosis of skullbase vasculature without
hyperdense vessel or abnormal calcification.

Skull: No fracture or focal lesion.

Sinuses/Orbits: Paranasal sinuses and mastoid air cells are clear.
The visualized orbits are unremarkable. Bilateral cataract
resection.

Other: None.
IMPRESSION: 1. No acute intracranial abnormality.
2. Periventricular white matter hypodensity is nonspecific,
typically chronic small vessel ischemia, but age advanced.
3. Small focal encephalomalacia in the left posterior
frontal/parietal lobe suggests prior infarct.

## 2023-10-02 ENCOUNTER — Telehealth (INDEPENDENT_AMBULATORY_CARE_PROVIDER_SITE_OTHER): Payer: Self-pay | Admitting: Otolaryngology

## 2023-10-02 NOTE — Telephone Encounter (Signed)
Confirmed appt & location [id number]

## 2023-10-05 ENCOUNTER — Encounter (INDEPENDENT_AMBULATORY_CARE_PROVIDER_SITE_OTHER): Payer: Self-pay

## 2023-10-05 ENCOUNTER — Ambulatory Visit (INDEPENDENT_AMBULATORY_CARE_PROVIDER_SITE_OTHER): Payer: Medicare Other | Admitting: Otolaryngology

## 2023-10-05 DIAGNOSIS — H6121 Impacted cerumen, right ear: Secondary | ICD-10-CM | POA: Diagnosis not present

## 2023-10-05 DIAGNOSIS — H7313 Chronic myringitis, bilateral: Secondary | ICD-10-CM | POA: Diagnosis not present

## 2023-10-05 DIAGNOSIS — H608X3 Other otitis externa, bilateral: Secondary | ICD-10-CM

## 2023-10-05 DIAGNOSIS — H938X3 Other specified disorders of ear, bilateral: Secondary | ICD-10-CM | POA: Diagnosis not present

## 2023-10-05 MED ORDER — CIPROFLOXACIN-DEXAMETHASONE 0.3-0.1 % OT SUSP: 4.0000 [drp] | Freq: Two times a day (BID) | OTIC | 1 refills | Status: AC

## 2023-10-05 NOTE — Progress Notes (Unsigned)
Dear Dr. Doloris Hall, Here is my assessment for our mutual patient, Veronica Weber. Thank you for allowing me the opportunity to care for your patient. Please do not hesitate to contact me should you have any other questions. Sincerely, Dr. Jovita Kussmaul  Otolaryngology Clinic Note Referring provider: Dr. Doloris Hall HPI:  Veronica Weber is a 68 y.o. female kindly referred by Dr. Doloris Hall for evaluation of ear itching.  Patient reports: she reports that she has bilateral (right > left) ear itching since around christmas. She reports a sensation of fullness/clogging ("feels like water in the ears") in the ears as well. She has had itching for well over a year. Intermittent tinnitus. Hearing has declined. She has tried amoxicillin for AOM by her PCP but no drops. She was diagnosed with AOM in December for these symptoms, but amoxicillin did not help. No antecedent event such as a URI Patient denies: ear pain, vertigo, drainage Patient additionally denies: deep pain in ear canal, eustachian tube symptoms such as popping, crackling Patient also denies barotrauma, vestibular suppressant use, ototoxic medication use Prior ear surgery: no Ears normally not a problem. No frequent ear infections.  H&N Surgery: no Personal or FHx of bleeding dz or anesthesia difficulty: no  AP/AC: ASA 81  Tobacco: 40 pack year, but quit a year ago. Lives in [place], [place]  PMHx: Overactive bladder, Neuropathic Pain, Daytime sleepiness, Prediabetes, Hypothyroidism, Stroke  Independent Review of Additional Tests or Records:  Alyssa Allwardt PA-C PCP (08/27/2023): noted bilateral OM due to ear discomfort (clogged ears, fluctuating hearing, uses q-tips, TM perf in childhood), worse on left with green(?) fluid behind TM; possible right TM perf; Dx: AOM; Rx: Augmentin, Ref ENT Alyssa Allwardt PA-C PCP (09/09/2023): OM - improved after augmentin but persistent erythema; Dx: AOM; Rx: Ref ENT MRI Brain 11/17/2021: CT  Head 11/16/2021:   PMH/Meds/All/SocHx/FamHx/ROS:   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   Hyperlipidemia    Hypertension    Thyroid disease    Tobacco use      Past Surgical History:  Procedure Laterality Date   APPENDECTOMY  1976   CATARACT EXTRACTION     1997, 1998   CESAREAN SECTION  1983   CHOLECYSTECTOMY  1998   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   Procedure: LEFT TOTAL KNEE ARTHROPLASTY;  Surgeon: [name], MD;  Location: [facility] OR;  Service: Orthopedics;  Laterality: Left;   TUBAL LIGATION  1994    Family History  Problem Relation Age of Onset   Arthritis Mother    Cancer Maternal Grandmother    Diabetes Maternal Grandmother    Diabetes Maternal Grandfather    Healthy Daughter    Healthy Son      Social Connections: Socially Isolated (02/12/2023)   Social Connection and Isolation Panel [NHANES]    Frequency of Communication with Friends and Family: More than three times a week    Frequency of Social Gatherings with Friends and Family: More than three times a week    Attends Religious Services: Never    Database administrator or Organizations: No    Attends Banker Meetings: Never    Marital Status: Divorced      Current Outpatient Medications:    amoxicillin-clavulanate (AUGMENTIN) 875-125 MG tablet, Take 1 tablet by mouth 2 (two) times daily. Take with food., Disp: 20 tablet, Rfl:  0   aspirin 81 MG chewable tablet, Chew 1 tablet (81 mg total) by mouth 2 (two) times daily., Disp: 60 tablet, Rfl: 0   atorvastatin (LIPITOR) 80 MG tablet, TAKE 1 TABLET BY MOUTH EVERY MORNING, Disp: 30 tablet, Rfl: 10   chlorthalidone (HYGROTON) 25 MG tablet, TAKE 1/2 TABLET BY MOUTH EVERY MORNING, Disp: 90 tablet, Rfl: PRN   gabapentin (NEURONTIN) 300 MG capsule, TAKE 1 TO 2 CAPSULES BY MOUTH AT bedtime AS DIRECTED, Disp: 90 capsule, Rfl: 1    levothyroxine (SYNTHROID) 25 MCG tablet, Take 1 tablet (25 mcg total) by mouth daily before breakfast. Take on an empty stomach., Disp: 30 tablet, Rfl: 1   losartan (COZAAR) 50 MG tablet, TAKE 1 TABLET BY MOUTH DAILY, Disp: 100 tablet, Rfl: 2   mirabegron ER (MYRBETRIQ) 25 MG TB24 tablet, Take 1 tablet (25 mg total) by mouth daily. Take for overactive bladder., Disp: 30 tablet, Rfl: 2   Multiple Vitamins-Minerals (MULTIVITAMIN WITH MINERALS) tablet, Take 1 tablet by mouth daily., Disp: , Rfl:    naltrexone (DEPADE) 50 MG tablet, TAKE 1/2 TABLET BY MOUTH ONCE DAILY, Disp: 15 tablet, Rfl: 10   Physical Exam:   There were no vitals taken for this visit.  Salient findings:  CN II-XII intact *** Bilateral EAC clear and TM intact with well pneumatized middle ear spaces Weber 512: *** Rinne 512: AC > BC b/l *** Rine 1024: AC > BC b/l *** Anterior rhinoscopy: Septum ***; bilateral inferior turbinates with *** No lesions of oral cavity/oropharynx; dentition *** No obviously palpable neck masses/lymphadenopathy/thyromegaly No respiratory distress or stridor***  Right impaction; right purulence drainage; anterior porti onof TM with myringitis. CSF powder placed  Seprately Identifiable Procedures:  None***  Impression & Plans:  Veronica Weber is a 68 y.o. female with ***  No diagnosis found.   - f/u ***  See below regarding exact medications prescribed this encounter including dosages and route: No orders of the defined types were placed in this encounter.     Thank you for allowing me the opportunity to care for your patient. Please do not hesitate to contact me should you have any other questions.  Sincerely, Jovita Kussmaul, MD Otolaryngologist (ENT), [facility] Phone: [phone] Fax: [phone]  10/05/2023, 8:06 AM   MDM:  Level *** Complexity/Problems addressed: *** Data complexity: *** independent review of *** - Morbidity: ***  - Prescription  Drug prescribed or managed: ***

## 2023-10-05 NOTE — Patient Instructions (Signed)
Look on goodrx.com for a coupon for the ear drops -- use ciprodex ear drops 4 drops twice daily in each ear for 21 days Keep ear dry - use cotton ball in ear coated in vaseline while showering.

## 2023-10-22 ENCOUNTER — Other Ambulatory Visit: Payer: Self-pay

## 2023-10-22 ENCOUNTER — Other Ambulatory Visit: Payer: Self-pay | Admitting: Physician Assistant

## 2023-10-27 ENCOUNTER — Ambulatory Visit (INDEPENDENT_AMBULATORY_CARE_PROVIDER_SITE_OTHER): Payer: Medicare Other | Admitting: Physician Assistant

## 2023-10-27 ENCOUNTER — Encounter: Payer: Self-pay | Admitting: Physician Assistant

## 2023-10-27 VITALS — BP 132/84 | HR 92 | Temp 97.9°F | Ht 60.0 in | Wt 240.8 lb

## 2023-10-27 DIAGNOSIS — N3281 Overactive bladder: Secondary | ICD-10-CM | POA: Diagnosis not present

## 2023-10-27 DIAGNOSIS — E039 Hypothyroidism, unspecified: Secondary | ICD-10-CM

## 2023-10-27 DIAGNOSIS — R600 Localized edema: Secondary | ICD-10-CM | POA: Diagnosis not present

## 2023-10-27 DIAGNOSIS — R748 Abnormal levels of other serum enzymes: Secondary | ICD-10-CM

## 2023-10-27 DIAGNOSIS — L02411 Cutaneous abscess of right axilla: Secondary | ICD-10-CM | POA: Diagnosis not present

## 2023-10-27 DIAGNOSIS — Z5986 Financial insecurity: Secondary | ICD-10-CM

## 2023-10-27 DIAGNOSIS — R918 Other nonspecific abnormal finding of lung field: Secondary | ICD-10-CM

## 2023-10-27 LAB — HEPATIC FUNCTION PANEL
ALT: 33 U/L (ref 0–35)
AST: 33 U/L (ref 0–37)
Albumin: 4 g/dL (ref 3.5–5.2)
Alkaline Phosphatase: 149 U/L — ABNORMAL HIGH (ref 39–117)
Bilirubin, Direct: 0.1 mg/dL (ref 0.0–0.3)
Total Bilirubin: 0.4 mg/dL (ref 0.2–1.2)
Total Protein: 7.5 g/dL (ref 6.0–8.3)

## 2023-10-27 LAB — TSH: TSH: 6.03 u[IU]/mL — ABNORMAL HIGH (ref 0.35–5.50)

## 2023-10-27 MED ORDER — MIRABEGRON ER 25 MG PO TB24: 25.0000 mg | ORAL_TABLET | Freq: Every day | ORAL | 2 refills | Status: DC

## 2023-10-27 MED ORDER — SULFAMETHOXAZOLE-TRIMETHOPRIM 800-160 MG PO TABS: 1.0000 | ORAL_TABLET | Freq: Two times a day (BID) | ORAL | 0 refills | Status: AC

## 2023-10-27 NOTE — Patient Instructions (Signed)
 VISIT SUMMARY:  Today, we addressed several of your health concerns, including an abscess under your right arm, thyroid issues, liver function, ear complaints, lower extremity swelling, bladder issues, and pulmonary nodules. We discussed your symptoms, reviewed your current medications, and made plans for further evaluation and treatment.  YOUR PLAN:  -EAR COMPLAINTS: Your hearing has improved and itching has reduced after using the prescribed ear drops. Continue using the ear drops as directed and follow up with the ENT on 11/12/2023.  -HYPOTHYROIDISM: Hypothyroidism means your thyroid gland is underactive. We will check your thyroid function today and encourage you to take your thyroid medication consistently.  -LIVER FUNCTION: Your liver function test has been elevated. We will check it again today to monitor for any further changes. It's important to screen for potential cancerous processes.  -SKIN ABSCESS, RIGHT AXILLA: You have an open, painful abscess under your right arm. We have prescribed Bactrim DS, to be taken one tablet twice daily for 5-7 days, and advised using warm compresses multiple times daily. If there is no improvement, it may need to be drained in the office.  -LOWER EXTREMITY EDEMA: You have swelling in your right foot and tingling in your toes. We advise you to monitor these symptoms and consider further evaluation if they persist or worsen.  -URINARY COMPLAINTS: You have been inconsistent with taking Myrbetriq for bladder issues. We have refilled your prescription and encourage you to take it consistently.  -PULMONARY NODULES: Pulmonary nodules are small growths in the lungs. You missed your follow-up CT scan in December 2023, so we have ordered a new chest CT scan to monitor them.  -GENERAL HEALTH MAINTENANCE: We discussed the possibility of referring you to a social worker for assistance with financial and resource issues.  INSTRUCTIONS:  Please follow up with the  ENT on 11/12/2023. We will check your thyroid function and liver function today. Continue taking Bactrim DS as prescribed and use warm compresses for the abscess. Monitor your foot swelling and tingling, and take Myrbetriq consistently. Schedule a chest CT scan to follow up on your pulmonary nodules.

## 2023-10-27 NOTE — Progress Notes (Signed)
 Patient ID: Veronica Weber, female    DOB: 1955/09/14, 68 y.o.   MRN: [id number]   Assessment & Plan:  Hypothyroidism, unspecified type -     TSH  Elevated alkaline phosphatase level -     Hepatic function panel  Abscess of right axilla -     Sulfamethoxazole-Trimethoprim; Take 1 tablet by mouth 2 (two) times daily for 7 days.  Dispense: 14 tablet; Refill: 0  Pulmonary nodules -     CT CHEST LCS NODULE F/U LOW DOSE WO CONTRAST; Future  Overactive bladder -     Mirabegron ER; Take 1 tablet (25 mg total) by mouth daily. Take for overactive bladder.  Dispense: 30 tablet; Refill: 2  Financial insecurity -     Ambulatory referral to Social Work  Localized edema     Assessment and Plan    Ear Complaints Improved hearing and reduced itching after ENT visit and ear drops. -Continue prescribed ear drops and follow-up with ENT on 11/12/2023.  Hypothyroidism Admitted inconsistent use of thyroid medication. -Check thyroid function today to assess current status. -Encourage consistent use of thyroid medication.  Liver Function Elevated liver function test. -Check liver function test today to monitor for further elevation. -Discussed importance of cancer screening due to potential for malignant processes.  Skin Abscess, Right Axilla Open, painful, and draining abscess under right arm. -Prescribe Bactrim DS, take one tablet twice daily for 5-7 days. -Advise warm compresses multiple times daily. -If no improvement, may need to drain in office.  Lower Extremity Edema Noted right foot swelling and tingling in toes. -Advise patient to monitor. -Consider further evaluation if symptoms persist or worsen.  Urinary Complaints Inconsistent use of Myrbetriq for bladder issues. -Refill Myrbetriq prescription. -Encourage consistent use of medication.  Pulmonary Nodules Missed follow-up CT scan in December 2023. -Order chest CT scan to follow-up on pulmonary  nodules.  General Health Maintenance -Consider referral to social worker for assistance with financial and resource issues.         Return in about 2 months (around 12/25/2023) for recheck/follow-up.    Subjective:    Chief Complaint  Patient presents with   Medical Management of Chronic Issues    Pt in office for 6-8 wk follow up; pt states things are going ok; pt has been hurting under right arm due to boil; it has popped but still hurting;     HPI Discussed the use of AI scribe software for clinical note transcription with the patient, who gave verbal consent to proceed.  History of Present Illness   Veronica Weber "Veronica Weber" is a 68 year old female who presents with an abscess in the right axilla.  She has had an abscess under her right arm for almost two weeks. Initially, it was sore and swollen, and by last Thursday, it became wet and swollen. She applied a Band-Aid to prevent rubbing, and after removing it, she observed that it had opened up but remained hard, red, and painful with a burning sensation. She has a history of cysts on her back, described as 'little pockets of pus,' and now has a similar issue under her arm.  She recently had an ear, nose, and throat referral where her ears were cleaned, and she was prescribed ear drops that improved her hearing and reduced itching. She mentions the drops are expensive and not covered by insurance.  She has a history of thyroid issues and occasionally forgets to take her thyroid medication, especially when she eats  before remembering to take it. She expresses concern about her liver function test, which has been elevated.  She mentions a history of pulmonary nodules that were being monitored with a chest CT scan, which she believes she completed last year.  She is currently taking Myrbetriq for bladder issues, which she feels helps somewhat but not significantly. She also takes gabapentin.  She experiences symptoms  consistent with Raynaud's disease, such as her feet turning white in the cold. She experiences fatigue and mentions difficulty sleeping due to leg pain, which requires her to stretch her leg out while sleeping. She also reports tingling in her toes. No numbness in the foot.       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   Procedure: LEFT TOTAL KNEE ARTHROPLASTY;  Surgeon: [name], MD;  Location: [facility] OR;  Service: Orthopedics;  Laterality: Left;   TUBAL LIGATION  1994    Family History  Problem Relation Age of Onset   Arthritis Mother    Cancer Maternal Grandmother    Diabetes Maternal Grandmother    Diabetes Maternal Grandfather    Healthy Daughter    Healthy Son     Social History   Tobacco Use   Smoking status: Former    Current packs/day: 0.00    Average packs/day: 1 pack/day for 46.0 years (46.0 ttl pk-yrs)    Types: Cigarettes    Start date: 06/01/1976    Quit date: 05/15/2022    Years since quitting: 1.4   Smokeless tobacco: Never  Vaping Use   Vaping status: Never Used  Substance Use Topics   Alcohol use: Not Currently   Drug use: Never     No Known Allergies  Review of Systems NEGATIVE UNLESS OTHERWISE INDICATED IN HPI      Objective:     BP 132/84 (BP Location: Left Arm, Patient Position: Sitting, Cuff Size: Normal)   Pulse 92   Temp 97.9 F (36.6 C) (Temporal)   Ht 5' (1.524 m)   Wt 240 lb 12.8 oz (109.2 kg)   SpO2 96%   BMI 47.03 kg/m   Wt Readings from Last 3 Encounters:  10/27/23 240 lb 12.8 oz (109.2 kg)  08/27/23 239 lb 6.4 oz (108.6 kg)  02/24/23  217 lb 3.2 oz (98.5 kg)    BP Readings from Last 3 Encounters:  10/27/23 132/84  09/09/23 130/78  08/27/23 130/68     Physical Exam Vitals and nursing note reviewed.  Constitutional:      General: She is not in acute distress.    Appearance: Normal appearance. She is obese. She is not ill-appearing.  HENT:     Head: Normocephalic.     Right Ear: External ear normal.     Left Ear: External ear normal.     Nose: No congestion.     Mouth/Throat:     Mouth: Mucous membranes are moist.     Pharynx: No oropharyngeal exudate or posterior oropharyngeal erythema.  Eyes:  Extraocular Movements: Extraocular movements intact.     Conjunctiva/sclera: Conjunctivae normal.     Pupils: Pupils are equal, round, and reactive to light.  Cardiovascular:     Rate and Rhythm: Normal rate and regular rhythm.     Pulses: Normal pulses.     Heart sounds: Normal heart sounds. No murmur heard. Pulmonary:     Effort: Pulmonary effort is normal. No respiratory distress.     Breath sounds: Normal breath sounds. No wheezing.  Musculoskeletal:        General: Swelling (edema R ankle and foot > L foot and ankle) present.     Cervical back: Normal range of motion.  Skin:    General: Skin is warm.     Findings: Lesion (Firm indurated erythematous abscess R axilla) present.  Neurological:     Mental Status: She is alert and oriented to person, place, and time.     Comments: Slow mobility due to pain in legs  Psychiatric:        Mood and Affect: Mood normal.        Behavior: Behavior normal.           Time Spent: 38 minutes of total time was spent on the date of the encounter performing the following actions: chart review prior to seeing the patient, obtaining history, performing a medically necessary exam, counseling on the treatment plan, placing orders, and documenting in our EHR.       Ezelle Surprenant M Claressa Hughley, PA-C

## 2023-10-28 ENCOUNTER — Encounter: Payer: Self-pay | Admitting: Physician Assistant

## 2023-11-11 ENCOUNTER — Telehealth (INDEPENDENT_AMBULATORY_CARE_PROVIDER_SITE_OTHER): Payer: Self-pay | Admitting: Otolaryngology

## 2023-11-11 NOTE — Telephone Encounter (Signed)
 LVM to confirm appt & location [id number] afm

## 2023-11-12 ENCOUNTER — Ambulatory Visit (INDEPENDENT_AMBULATORY_CARE_PROVIDER_SITE_OTHER): Payer: Medicare Other | Admitting: Audiology

## 2023-11-12 ENCOUNTER — Ambulatory Visit (INDEPENDENT_AMBULATORY_CARE_PROVIDER_SITE_OTHER): Payer: Medicare Other

## 2023-11-12 ENCOUNTER — Encounter (INDEPENDENT_AMBULATORY_CARE_PROVIDER_SITE_OTHER): Payer: Self-pay

## 2023-11-12 ENCOUNTER — Telehealth: Payer: Self-pay

## 2023-11-12 ENCOUNTER — Other Ambulatory Visit: Payer: Self-pay

## 2023-11-12 VITALS — BP 117/79 | HR 106 | Ht 60.0 in | Wt 240.0 lb

## 2023-11-12 DIAGNOSIS — H903 Sensorineural hearing loss, bilateral: Secondary | ICD-10-CM

## 2023-11-12 DIAGNOSIS — N3281 Overactive bladder: Secondary | ICD-10-CM

## 2023-11-12 DIAGNOSIS — H938X3 Other specified disorders of ear, bilateral: Secondary | ICD-10-CM

## 2023-11-12 DIAGNOSIS — H7313 Chronic myringitis, bilateral: Secondary | ICD-10-CM | POA: Diagnosis not present

## 2023-11-12 DIAGNOSIS — H608X3 Other otitis externa, bilateral: Secondary | ICD-10-CM

## 2023-11-12 MED ORDER — BETAMETHASONE DIPROPIONATE 0.05 % EX CREA: TOPICAL_CREAM | Freq: Two times a day (BID) | CUTANEOUS | 0 refills | Status: AC

## 2023-11-12 NOTE — Patient Instructions (Signed)
 Put betamethasone cream to outside of ear twice daily for 5 days for any itching

## 2023-11-12 NOTE — Telephone Encounter (Signed)
 Copied from CRM [id number]. Topic: General - Other >> Nov 11, 2023  3:20 PM Rodman Pickle T wrote: Reason for CRM: patient stated she is not able to get the medication mirabegron ER (MYRBETRIQ) 25 MG TB24 she stated it is to expensive for her  Please see pt concerns with medication being to expensive and advise if any alternatives you suggest

## 2023-11-12 NOTE — Telephone Encounter (Signed)
 Patient agreeable to referral and order placed, advised allow 7-10 business days for them to contact patient directly to be scheduled. Pt verbalized understanding

## 2023-11-12 NOTE — Progress Notes (Signed)
 Dear Dr. Doloris Hall, Here is my assessment for our mutual patient, Veronica Weber. Thank you for allowing me the opportunity to care for your patient. Please do not hesitate to contact me should you have any other questions. Sincerely, Dr. Jovita Kussmaul  Otolaryngology Clinic Note Referring provider: Dr. Doloris Hall HPI:  Veronica Weber is a 68 y.o. female kindly referred by Dr. Doloris Hall for evaluation of ear itching.  Initial visit (10/2023): Patient reports: she reports that she has bilateral (right > left) ear itching since around christmas. She reports a sensation of fullness/clogging ("feels like water in the ears") in the ears as well. She has had itching for well over a year. Intermittent tinnitus. Hearing has declined. She has tried amoxicillin for AOM by her PCP but no drops. She was diagnosed with AOM in December for these symptoms, but amoxicillin did not help. No antecedent event such as a URI Patient denies: ear pain, vertigo, drainage Patient additionally denies: deep pain in ear canal, eustachian tube symptoms such as popping, crackling Patient also denies barotrauma, vestibular suppressant use, ototoxic medication use Prior ear surgery: no Ears normally not a problem. No frequent ear infections. No recent hearing test.   --------------------------------------------------------- 11/12/2023 Noted to have b/l myringitis for which she was treated with CASH powder and ciprodex. Doing much better today. She reports that her itching has essentially resolved, no fullness, no drainage, no otalgia, tinnitus or vertigo. She does have rare itching right around the outside of the ear canal. She also reports her hearing is back to normal. She is happy with how she has done. She did have an audiogram today -----------------------------------------------------------  H&N Surgery: no Personal or FHx of bleeding dz or anesthesia difficulty: no  AP/AC: ASA 81  Tobacco: 40 pack year,  but quit a year ago. Lives in [place], [place]  PMHx: Overactive bladder, Neuropathic Pain, Daytime sleepiness, Prediabetes, Hypothyroidism, Stroke  Independent Review of Additional Tests or Records:  Alyssa Allwardt PA-C PCP (08/27/2023): noted bilateral OM due to ear discomfort (clogged ears, fluctuating hearing, uses q-tips, TM perf in childhood), worse on left with green(?) fluid behind TM; possible right TM perf; Dx: AOM; Rx: Augmentin, Ref ENT Alyssa Allwardt PA-C PCP (09/09/2023): OM - improved after augmentin but persistent erythema; Dx: AOM; Rx: Ref ENT MRI Brain 11/17/2021 independent interpret with attention to ears: no retrocochlear lesions noted; mastoids well aerated CT Head 11/16/2021 independent interpret: no clear paranasal sinus disease, and mastoids/ME well aerated; TM does look thickened anteriorly b/l; cuts thick but otic capsule and ossicles appear without significant pathology 10/2023 Audiogram was independently reviewed and interpreted by me and it reveals: AU normal downsloping to mild SNHL - mild <5dB ABG; WRT 96% at 55dB; A/A tymps, slight asymmetry 6000 dB   PMH/Meds/All/SocHx/FamHx/ROS:   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   Hyperlipidemia    Hypertension    Thyroid disease    Tobacco use      Past Surgical History:  Procedure Laterality Date   APPENDECTOMY  1976   CATARACT EXTRACTION     1997, 1998   CESAREAN SECTION  1983   CHOLECYSTECTOMY  1998   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   Procedure: LEFT TOTAL KNEE ARTHROPLASTY;  Surgeon: [name], MD;  Location: [facility] OR;  Service: Orthopedics;  Laterality: Left;   TUBAL LIGATION  1994  Family History  Problem Relation Age of Onset   Arthritis Mother    Cancer Maternal Grandmother    Diabetes Maternal Grandmother    Diabetes Maternal Grandfather    Healthy Daughter    Healthy  Son      Social Connections: Socially Isolated (02/12/2023)   Social Connection and Isolation Panel [NHANES]    Frequency of Communication with Friends and Family: More than three times a week    Frequency of Social Gatherings with Friends and Family: More than three times a week    Attends Religious Services: Never    Database administrator or Organizations: No    Attends Banker Meetings: Never    Marital Status: Divorced      Current Outpatient Medications:    aspirin 81 MG chewable tablet, Chew 1 tablet (81 mg total) by mouth 2 (two) times daily., Disp: 60 tablet, Rfl: 0   atorvastatin (LIPITOR) 80 MG tablet, TAKE 1 TABLET BY MOUTH EVERY MORNING, Disp: 30 tablet, Rfl: 10   betamethasone dipropionate 0.05 % cream, Apply topically 2 (two) times daily for 5 days. Apply on outside of ear for 5 days, Disp: 30 g, Rfl: 0   chlorthalidone (HYGROTON) 25 MG tablet, TAKE 1/2 TABLET BY MOUTH EVERY MORNING, Disp: 90 tablet, Rfl: PRN   gabapentin (NEURONTIN) 300 MG capsule, TAKE 1 TO 2 CAPSULES BY MOUTH AT bedtime AS DIRECTED, Disp: 90 capsule, Rfl: 1   levothyroxine (SYNTHROID) 25 MCG tablet, TAKE 1 TABLET BY MOUTH DAILY  BEFORE BREAKFAST TAKE ON AN  EMPTY STOMACH, Disp: 60 tablet, Rfl: 5   losartan (COZAAR) 50 MG tablet, TAKE 1 TABLET BY MOUTH DAILY, Disp: 100 tablet, Rfl: 2   Multiple Vitamins-Minerals (MULTIVITAMIN WITH MINERALS) tablet, Take 1 tablet by mouth daily., Disp: , Rfl:    mirabegron ER (MYRBETRIQ) 25 MG TB24 tablet, Take 1 tablet (25 mg total) by mouth daily. Take for overactive bladder. (Patient not taking: Reported on 11/12/2023), Disp: 30 tablet, Rfl: 2   naltrexone (DEPADE) 50 MG tablet, TAKE 1/2 TABLET BY MOUTH ONCE DAILY (Patient not taking: Reported on 11/12/2023), Disp: 15 tablet, Rfl: 10   Physical Exam:   BP 117/79 (BP Location: Right Arm, Patient Position: Sitting, Cuff Size: Large)   Pulse (!) 106   Ht 5' (1.524 m)   Wt 240 lb (108.9 kg)   SpO2 94%   BMI  46.87 kg/m   Salient findings:  CN II-XII intact Given history and complaints, ear microscopy was indicated and performed for evaluation with findings as below in physical exam section and in procedures. EAC patent, minimal amount of residual powder suctioned right; TM intact, ME well aerated; mild b/l eczematoid change both ears Weber 512: mid Rinne 512: AC > BC b/l  Anterior rhinoscopy: Septum intact; no purulence No lesions of oral cavity/oropharynx No obviously palpable neck masses/lymphadenopathy/thyromegaly No respiratory distress or stridor  Seprately Identifiable Procedures:  Procedure: Bilateral ear microscopy using microscope (CPT 92504) Pre-procedure diagnosis: bilateral myringitis of tympanic membrane Post-procedure diagnosis: same Indication: bilateral myringitis of tympanic membrane; given patient's otologic complaints and history, for improved and comprehensive examination of external ear and tympanic membrane, bilateral otologic examination using microscope was performed  Procedure: Patient was placed semi-recumbent. Both ear canals were examined using the microscope with findings above. Patient tolerated the procedure well.   Impression & Plans:  Veronica Weber is a 68 y.o. female with:  1. Chronic myringitis of both ears   2. Sensation of fullness in both  ears   3. Chronic eczematous otitis externa of both ears   4. Sensorineural hearing loss (SNHL) of both ears   Bilateral myringitis anterior portion of TM which has resolved after CASH powder and ciprodex. Audio generally reassuring; She is doing well now except for intermittent itching - likely due to eczematoid OE - Keep ear dry; reinforced avoiding qtips - Will start betamethasone ointment BID x5d to ear canal - Can use ciprodex and betamethasone for additional itching PRN - f/u PRN - should sx persist or return  See below regarding exact medications prescribed this encounter including dosages and  route: Meds ordered this encounter  Medications   betamethasone dipropionate 0.05 % cream    Sig: Apply topically 2 (two) times daily for 5 days. Apply on outside of ear for 5 days    Dispense:  30 g    Refill:  0   Thank you for allowing me the opportunity to care for your patient. Please do not hesitate to contact me should you have any other questions.  Sincerely, Jovita Kussmaul, MD Otolaryngologist (ENT), [facility] Phone: [phone] Fax: [phone]  11/12/2023, 4:15 PM   MDM:  Level 4 - 99214 Complexity/Problems addressed: mod - multiple problems Data complexity: low - Morbidity: mod  - Prescription Drug prescribed or managed: yes

## 2023-11-12 NOTE — Progress Notes (Signed)
  [address] [place], [place] [address] [phone]  Audiological Evaluation    Name: Veronica Weber     DOB:   Oct 10, 1955      MRN:   [id number]                                                                                     Service Date: 11/12/2023     Accompanied by: unaccompanied    Patient comes today after Dr. Allena Katz, ENT sent a referral for a hearing evaluation after bilateral ear infection.   Symptoms Yes Details  Hearing loss  [x]  While she had the infection, ot perceived anymore  Tinnitus  [x]  Mainly left  Ear pain/ infections/pressure  []    Balance problems  [x]  Reports they are related to blood pressure  Noise exposure history  [x]  Worked for a mill some years  Previous ear surgeries  []    Family history of hearing loss  [x]  Uncle-meningitis Does not know if her mother has any  Amplification  []    Other  []      Otoscopy: Right ear: Clear external ear canals and notable landmarks visualized on the tympanic membrane. Left ear:  Clear external ear canals and notable landmarks visualized on the tympanic membrane.  Tympanometry: Right ear: Type A- Normal external ear canal volume with normal middle ear pressure and tympanic membrane compliance Left ear: Type A- Normal external ear canal volume with normal middle ear pressure and tympanic membrane compliance    Pure tone Audiometry: Right ear- Normal hearing from [phone] Hz, except for a mild presumably sensorineural hearing loss at 8000 Hz. Left ear-  Normal hearing from [phone] Hz, then mild  presumably sensorineural hearing loss from 6000 Hz - 8000 Hz.  Speech Audiometry: Right ear- Speech Reception Threshold (SRT) was obtained at 20 dBHL. Left ear-Speech Reception Threshold (SRT) was obtained at 15 dBHL.   Word Recognition Score Tested using NU-6 (MLV) Right ear: 96% was obtained at a presentation level of 55 dBHL with contralateral masking which is deemed as  excellent. Left ear:  96% was obtained at a presentation level of 55 dBHL with contralateral masking which is deemed as  excellent.   The hearing test results were completed under headphones and results are deemed to be of good reliability. Test technique:  conventional     Recommendations: Follow up with ENT as scheduled for today. Return for a hearing evaluation if concerns with hearing changes arise or per MD recommendation.    Ulice Follett MARIE LEROUX-MARTINEZ, AUD

## 2023-12-05 ENCOUNTER — Other Ambulatory Visit: Payer: Self-pay | Admitting: Physician Assistant

## 2023-12-20 ENCOUNTER — Other Ambulatory Visit: Payer: Self-pay | Admitting: Physician Assistant

## 2023-12-29 ENCOUNTER — Encounter: Payer: Self-pay | Admitting: Physician Assistant

## 2023-12-29 ENCOUNTER — Ambulatory Visit (INDEPENDENT_AMBULATORY_CARE_PROVIDER_SITE_OTHER): Payer: Medicare Other | Admitting: Physician Assistant

## 2023-12-29 VITALS — BP 106/70 | HR 110 | Temp 97.9°F | Ht 60.0 in | Wt 241.0 lb

## 2023-12-29 DIAGNOSIS — R519 Headache, unspecified: Secondary | ICD-10-CM

## 2023-12-29 DIAGNOSIS — M5431 Sciatica, right side: Secondary | ICD-10-CM | POA: Diagnosis not present

## 2023-12-29 DIAGNOSIS — G4709 Other insomnia: Secondary | ICD-10-CM | POA: Diagnosis not present

## 2023-12-29 DIAGNOSIS — K760 Fatty (change of) liver, not elsewhere classified: Secondary | ICD-10-CM

## 2023-12-29 DIAGNOSIS — E039 Hypothyroidism, unspecified: Secondary | ICD-10-CM

## 2023-12-29 MED ORDER — GABAPENTIN 300 MG PO CAPS: 600.0000 mg | ORAL_CAPSULE | Freq: Every day | ORAL | 1 refills | Status: DC

## 2023-12-29 NOTE — Progress Notes (Unsigned)
    Patient ID: Veronica Weber, female    DOB: 22-Nov-1955, 68 y.o.   MRN: [id number]   Assessment & Plan:  There are no diagnoses linked to this encounter.    Assessment and Plan Assessment & Plan       No follow-ups on file.    Subjective:    Chief Complaint  Patient presents with   Medical Management of Chronic Issues    Pt in office for 2 mon f/u; pt has had a lot going on with possible move; pt has no concerns to discuss    HPI Discussed the use of AI scribe software for clinical note transcription with the patient, who gave verbal consent to proceed.  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   Procedure: LEFT TOTAL KNEE ARTHROPLASTY;  Surgeon: Jasmine Mesi, MD;  Location: [facility] OR;  Service: Orthopedics;  Laterality: Left;   TUBAL LIGATION  1994    Family History  Problem Relation Age of Onset   Arthritis Mother    Cancer Maternal Grandmother    Diabetes Maternal Grandmother    Diabetes Maternal Grandfather    Healthy Daughter    Healthy Son     Social History   Tobacco Use   Smoking status: Former    Current packs/day: 0.00    Average packs/day: 1 pack/day for 46.0 years (46.0 ttl pk-yrs)    Types: Cigarettes    Start date: 06/01/1976    Quit date: 05/15/2022    Years since quitting: 1.6   Smokeless tobacco: Never  Vaping Use   Vaping status: Never Used  Substance Use Topics   Alcohol use: Not Currently   Drug use: Never     No Known Allergies  Review of Systems NEGATIVE UNLESS OTHERWISE INDICATED IN HPI       Objective:     BP 106/70 (BP Location: Left Arm, Patient Position: Sitting, Cuff Size: Large)   Pulse (!) 110   Temp 97.9 F (36.6 C) (Temporal)   Ht 5' (1.524 m)   Wt 241 lb (109.3 kg)   SpO2 97%   BMI 47.07 kg/m   Wt Readings from Last 3 Encounters:  12/29/23 241 lb (109.3 kg)  11/12/23 240 lb (108.9 kg)  10/27/23 240 lb 12.8 oz (109.2 kg)    BP Readings from Last 3 Encounters:  12/29/23 106/70  11/12/23 117/79  10/27/23 132/84     Physical Exam          Domino Holten M Atlee Kluth, PA-C

## 2024-02-18 ENCOUNTER — Ambulatory Visit: Payer: Medicare Other

## 2024-02-29 ENCOUNTER — Emergency Department (HOSPITAL_COMMUNITY)

## 2024-02-29 ENCOUNTER — Inpatient Hospital Stay (HOSPITAL_COMMUNITY)
Admission: EM | Admit: 2024-02-29 | Discharge: 2024-03-07 | DRG: 682 | Disposition: A | Discharge status: Skilled Nursing Facility | Attending: Internal Medicine | Admitting: Internal Medicine

## 2024-02-29 ENCOUNTER — Encounter (HOSPITAL_COMMUNITY): Payer: Self-pay | Admitting: *Deleted

## 2024-02-29 ENCOUNTER — Other Ambulatory Visit: Payer: Self-pay

## 2024-02-29 DIAGNOSIS — Z8261 Family history of arthritis: Secondary | ICD-10-CM

## 2024-02-29 DIAGNOSIS — R296 Repeated falls: Secondary | ICD-10-CM | POA: Diagnosis present

## 2024-02-29 DIAGNOSIS — I82432 Acute embolism and thrombosis of left popliteal vein: Secondary | ICD-10-CM | POA: Diagnosis not present

## 2024-02-29 DIAGNOSIS — R7989 Other specified abnormal findings of blood chemistry: Secondary | ICD-10-CM | POA: Diagnosis not present

## 2024-02-29 DIAGNOSIS — I714 Abdominal aortic aneurysm, without rupture, unspecified: Secondary | ICD-10-CM | POA: Diagnosis not present

## 2024-02-29 DIAGNOSIS — Z833 Family history of diabetes mellitus: Secondary | ICD-10-CM

## 2024-02-29 DIAGNOSIS — I959 Hypotension, unspecified: Secondary | ICD-10-CM | POA: Diagnosis not present

## 2024-02-29 DIAGNOSIS — R55 Syncope and collapse: Secondary | ICD-10-CM | POA: Diagnosis not present

## 2024-02-29 DIAGNOSIS — Z87891 Personal history of nicotine dependence: Secondary | ICD-10-CM

## 2024-02-29 DIAGNOSIS — I10 Essential (primary) hypertension: Secondary | ICD-10-CM | POA: Diagnosis present

## 2024-02-29 DIAGNOSIS — J439 Emphysema, unspecified: Secondary | ICD-10-CM | POA: Diagnosis present

## 2024-02-29 DIAGNOSIS — Z6841 Body Mass Index (BMI) 40.0 and over, adult: Secondary | ICD-10-CM

## 2024-02-29 DIAGNOSIS — G8929 Other chronic pain: Secondary | ICD-10-CM | POA: Diagnosis present

## 2024-02-29 DIAGNOSIS — Z79899 Other long term (current) drug therapy: Secondary | ICD-10-CM

## 2024-02-29 DIAGNOSIS — J9601 Acute respiratory failure with hypoxia: Secondary | ICD-10-CM | POA: Diagnosis not present

## 2024-02-29 DIAGNOSIS — Z91199 Patient's noncompliance with other medical treatment and regimen due to unspecified reason: Secondary | ICD-10-CM

## 2024-02-29 DIAGNOSIS — E861 Hypovolemia: Secondary | ICD-10-CM | POA: Diagnosis not present

## 2024-02-29 DIAGNOSIS — R0602 Shortness of breath: Secondary | ICD-10-CM | POA: Diagnosis not present

## 2024-02-29 DIAGNOSIS — Z7982 Long term (current) use of aspirin: Secondary | ICD-10-CM

## 2024-02-29 DIAGNOSIS — I6782 Cerebral ischemia: Secondary | ICD-10-CM | POA: Diagnosis not present

## 2024-02-29 DIAGNOSIS — K760 Fatty (change of) liver, not elsewhere classified: Secondary | ICD-10-CM | POA: Diagnosis present

## 2024-02-29 DIAGNOSIS — Z8673 Personal history of transient ischemic attack (TIA), and cerebral infarction without residual deficits: Secondary | ICD-10-CM | POA: Diagnosis not present

## 2024-02-29 DIAGNOSIS — E860 Dehydration: Secondary | ICD-10-CM | POA: Diagnosis not present

## 2024-02-29 DIAGNOSIS — I2699 Other pulmonary embolism without acute cor pulmonale: Secondary | ICD-10-CM | POA: Diagnosis present

## 2024-02-29 DIAGNOSIS — R0902 Hypoxemia: Secondary | ICD-10-CM | POA: Insufficient documentation

## 2024-02-29 DIAGNOSIS — N179 Acute kidney failure, unspecified: Secondary | ICD-10-CM | POA: Diagnosis not present

## 2024-02-29 DIAGNOSIS — E039 Hypothyroidism, unspecified: Secondary | ICD-10-CM | POA: Diagnosis present

## 2024-02-29 DIAGNOSIS — I82442 Acute embolism and thrombosis of left tibial vein: Secondary | ICD-10-CM | POA: Diagnosis not present

## 2024-02-29 DIAGNOSIS — E785 Hyperlipidemia, unspecified: Secondary | ICD-10-CM | POA: Diagnosis not present

## 2024-02-29 DIAGNOSIS — Z96652 Presence of left artificial knee joint: Secondary | ICD-10-CM | POA: Diagnosis present

## 2024-02-29 DIAGNOSIS — I82413 Acute embolism and thrombosis of femoral vein, bilateral: Secondary | ICD-10-CM | POA: Diagnosis not present

## 2024-02-29 DIAGNOSIS — Z79890 Hormone replacement therapy (postmenopausal): Secondary | ICD-10-CM

## 2024-02-29 DIAGNOSIS — E876 Hypokalemia: Secondary | ICD-10-CM | POA: Diagnosis present

## 2024-02-29 DIAGNOSIS — R197 Diarrhea, unspecified: Secondary | ICD-10-CM | POA: Diagnosis present

## 2024-02-29 DIAGNOSIS — Z9849 Cataract extraction status, unspecified eye: Secondary | ICD-10-CM

## 2024-02-29 DIAGNOSIS — R519 Headache, unspecified: Secondary | ICD-10-CM | POA: Insufficient documentation

## 2024-02-29 LAB — BASIC METABOLIC PANEL WITH GFR
Anion gap: 11 (ref 5–15)
BUN: 59 mg/dL — ABNORMAL HIGH (ref 8–23)
CO2: 25 mmol/L (ref 22–32)
Calcium: 9.3 mg/dL (ref 8.9–10.3)
Chloride: 100 mmol/L (ref 98–111)
Creatinine, Ser: 1.79 mg/dL — ABNORMAL HIGH (ref 0.44–1.00)
GFR, Estimated: 31 mL/min — ABNORMAL LOW (ref >60)
Glucose, Bld: 130 mg/dL — ABNORMAL HIGH (ref 70–99)
Potassium: 3.6 mmol/L (ref 3.5–5.1)
Sodium: 136 mmol/L (ref 135–145)

## 2024-02-29 LAB — HEPATIC FUNCTION PANEL
ALT: 79 U/L — ABNORMAL HIGH (ref 0–44)
AST: 122 U/L — ABNORMAL HIGH (ref 15–41)
Albumin: 2.9 g/dL — ABNORMAL LOW (ref 3.5–5.0)
Alkaline Phosphatase: 102 U/L (ref 38–126)
Bilirubin, Direct: 0.2 mg/dL (ref 0.0–0.2)
Indirect Bilirubin: 0.8 mg/dL (ref 0.3–0.9)
Total Bilirubin: 1 mg/dL (ref 0.0–1.2)
Total Protein: 6.8 g/dL (ref 6.5–8.1)

## 2024-02-29 LAB — CBC
HCT: 41.7 % (ref 36.0–46.0)
Hemoglobin: 13.8 g/dL (ref 12.0–15.0)
MCH: 30.1 pg (ref 26.0–34.0)
MCHC: 33.1 g/dL (ref 30.0–36.0)
MCV: 90.8 fL (ref 80.0–100.0)
Platelets: 172 10*3/uL (ref 150–400)
RBC: 4.59 MIL/uL (ref 3.87–5.11)
RDW: 14.4 % (ref 11.5–15.5)
WBC: 14.5 10*3/uL — ABNORMAL HIGH (ref 4.0–10.5)
nRBC: 0.3 % — ABNORMAL HIGH (ref 0.0–0.2)

## 2024-02-29 MED ORDER — ENOXAPARIN SODIUM 30 MG/0.3ML IJ SOSY
30.0000 mg | PREFILLED_SYRINGE | INTRAMUSCULAR | Status: DC
  Administered 2024-02-29: 30 mg via SUBCUTANEOUS
  Filled 2024-02-29: qty 0.3

## 2024-02-29 MED ORDER — LACTATED RINGERS IV SOLN: INTRAVENOUS | Status: DC

## 2024-02-29 MED ORDER — LACTATED RINGERS IV BOLUS
500.0000 mL | Freq: Once | INTRAVENOUS | Status: AC
  Administered 2024-02-29: 500 mL via INTRAVENOUS

## 2024-02-29 MED ORDER — SODIUM CHLORIDE 0.9 % IV BOLUS
500.0000 mL | Freq: Once | INTRAVENOUS | Status: AC
  Administered 2024-02-29: 500 mL via INTRAVENOUS

## 2024-02-29 MED ORDER — ASPIRIN 81 MG PO CHEW
81.0000 mg | CHEWABLE_TABLET | Freq: Every day | ORAL | Status: DC
  Administered 2024-03-01 – 2024-03-07 (×7): 81 mg via ORAL
  Filled 2024-02-29 (×7): qty 1

## 2024-02-29 MED ORDER — ATORVASTATIN CALCIUM 80 MG PO TABS
80.0000 mg | ORAL_TABLET | Freq: Every morning | ORAL | Status: DC
  Administered 2024-03-01: 80 mg via ORAL
  Filled 2024-02-29: qty 1

## 2024-02-29 NOTE — ED Triage Notes (Signed)
 Pt states she is here with dizziness, sob, confusion, and says she starts talking and cannot remember what she was going to say. PT states she did fall in her bathroom and was on the floor for 8/10 hours until neighbor found her. Pt states she hurts on abdomen,  right knee, and right ankle.  O2sats 89% on RA and placed on 02/2L.

## 2024-02-29 NOTE — ED Provider Notes (Signed)
 [place] EMERGENCY DEPARTMENT AT [facility] Provider Note   CSN: [id number] Arrival date & time: 02/29/24  1357     Patient presents with: Shortness of Breath   Veronica Weber is a 68 y.o. female.    Shortness of Breath Patient presents with fatigue and shortness of breath.  Has had for almost a week now.  Has been dizzy.  Some confusion.  Has had recent falls.  May have hit head.  Difficulty getting up.  Also has had diarrhea for a better part of a week.  States it slowed down after some Imodium.  Has been more fatigued.  Does a pain in her right knee but is due to get a knee replacement.    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 Authorizing Provider  aspirin  81 MG chewable tablet Chew 1 tablet (81 mg total) by mouth 2 (two) times daily. 05/25/22   Genelle Standing, MD  atorvastatin  (LIPITOR ) 80 MG tablet TAKE 1 TABLET BY MOUTH EVERY MORNING 06/17/23   Allwardt, Alyssa M, PA-C  chlorthalidone  (HYGROTON ) 25 MG tablet TAKE 1/2 TABLET BY MOUTH EVERY MORNING 03/26/23   Allwardt, Alyssa M, PA-C  gabapentin  (NEURONTIN ) 300 MG capsule Take 2 capsules (600 mg total) by mouth at bedtime. TAKE 1 TO 2 CAPSULES BY MOUTH AT bedtime AS DIRECTED 12/29/23 03/28/24  Allwardt, Alyssa M, PA-C  levothyroxine  (SYNTHROID ) 25 MCG tablet TAKE 1 TABLET BY MOUTH DAILY  BEFORE BREAKFAST TAKE ON AN  EMPTY STOMACH 10/22/23   Allwardt, Alyssa M, PA-C  losartan  (COZAAR ) 50 MG tablet TAKE 1 TABLET BY MOUTH DAILY 12/22/23   Allwardt, Alyssa M, PA-C  mirabegron  ER (MYRBETRIQ ) 25 MG TB24 tablet Take 1 tablet (25 mg total) by mouth daily. Take for overactive bladder. 10/27/23   Allwardt, Alyssa M, PA-C  Multiple Vitamins-Minerals (MULTIVITAMIN  WITH MINERALS) tablet Take 1 tablet by mouth daily.    [provider]  naltrexone  (DEPADE) 50 MG tablet TAKE 1/2 TABLET BY MOUTH ONCE DAILY 05/14/23   Raulkar, Sven SQUIBB, MD    Allergies: Patient has no known allergies.    Review of Systems  Respiratory:  Positive for shortness of breath.     Updated Vital Signs BP 101/69   Pulse (!) 101   Temp 98.3 F (36.8 C)   Resp 18   SpO2 100%   Physical Exam  Cardiovascular:     Rate and Rhythm: Tachycardia present.  Pulmonary:     Breath sounds: No wheezing or rhonchi.  Chest:     Chest wall: No tenderness.   Musculoskeletal:     Right lower leg: Edema present.     Left lower leg: Edema present.   Skin:    Capillary Refill: Capillary refill takes less than 2 seconds.     Coloration: Skin is pale.   Neurological:     Mental Status: She is alert.     (all labs ordered are listed, but only abnormal results are displayed) Labs Reviewed  BASIC METABOLIC PANEL WITH GFR - Abnormal; Notable for the following components:      Result Value   Glucose, Bld 130 (*)  BUN 59 (*)    Creatinine, Ser 1.79 (*)    GFR, Estimated 31 (*)    All other components within normal limits  CBC - Abnormal; Notable for the following components:   WBC 14.5 (*)    nRBC 0.3 (*)    All other components within normal limits  HEPATIC FUNCTION PANEL - Abnormal; Notable for the following components:   Albumin 2.9 (*)    AST 122 (*)    ALT 79 (*)    All other components within normal limits    EKG: EKG Interpretation Date/Time:  Monday February 29 2024 15:24:45 EDT Ventricular Rate:  119 PR Interval:  138 QRS Duration:  82 QT Interval:  286 QTC Calculation: 402 R Axis:   267  Text Interpretation: * Suspect arm lead reversal, interpretation assumes no reversal Sinus tachycardia Low voltage QRS Cannot rule out Inferior infarct , age undetermined Possible Anterolateral infarct , age undetermined Abnormal ECG When compared with ECG of  16-Nov-2021 19:14, Low QRS voltage laterally Confirmed by Patsey Lot [phone]) on 02/29/2024 4:26:30 PM  Radiology: CT HEAD WO CONTRAST ( ) Result Date: 02/29/2024 CLINICAL DATA:  Mental status change, confusion, dizziness. EXAM: CT HEAD WITHOUT CONTRAST TECHNIQUE: Contiguous axial images were obtained from the base of the skull through the vertex without intravenous contrast. RADIATION DOSE REDUCTION: This exam was performed according to the departmental dose-optimization program which includes automated exposure control, adjustment of the mA and/or kV according to patient size and/or use of iterative reconstruction technique. COMPARISON:  MRI head 11/17/2021. FINDINGS: Brain: No acute intracranial hemorrhage. No CT evidence of acute infarct. Nonspecific hypoattenuation in the periventricular and subcortical white matter favored to reflect chronic microvascular ischemic changes. Remote infarcts in the bilateral parietal lobes. Generalized parenchymal volume loss. No edema, mass effect, or midline shift. The basilar cisterns are patent. Ventricles: The ventricles are normal. Vascular: Atherosclerotic calcifications of the carotid siphons and intracranial vertebral arteries. No hyperdense vessel. Skull: No acute or aggressive finding. Orbits: Bilateral lens replacement. Sinuses: Mild mucosal thickening in the left maxillary sinus. Other: Mastoid air cells are clear. IMPRESSION: No acute intracranial abnormality. Extensive chronic microvascular ischemic changes and mild parenchymal volume loss. Remote infarcts in the bilateral parietal lobes. Electronically Signed   By: Donnice Mania M.D.   On: 02/29/2024 16:52   DG Chest 2 View Result Date: 02/29/2024 CLINICAL DATA:  Shortness of breath EXAM: CHEST - 2 VIEW COMPARISON:  CT 08/11/2022 FINDINGS: Heart and mediastinal contours are within normal limits. No focal opacities or effusions. No acute bony abnormality. IMPRESSION: No active cardiopulmonary disease.  Electronically Signed   By: Franky Crease M.D.   On: 02/29/2024 15:01     Procedures   Medications Ordered in the ED  sodium chloride  0.9 % bolus 500 mL (0 mLs Intravenous Stopped 02/29/24 1746)                                    Medical Decision Making Amount and/or Complexity of Data Reviewed Labs: ordered. Radiology: ordered.   Patient with shortness of breath fatigue.  Some confusion.  Decreased oral intake.  Has had diarrhea.  Differential diagnosis along with include causes such as dehydration, anemia, intracranial hemorrhage.  Hemoglobin reassuring.  Will get blood work including renal function.  Will get head CT.  Head CT reassuring.  Creatinine elevated.  Has been dehydrated.  With fatigue and episodes of hypotension I think she  would benefit from mission to the hospital for hydration and further workup.  Hypoxia I think most likely due to her COPD.        Final diagnoses:  AKI (acute kidney injury) [facility])    ED Discharge Orders     None          Patsey Lot, MD 02/29/24 1859

## 2024-02-29 NOTE — ED Notes (Signed)
 Pt placed on the cardiac monitor and CCMD notified.

## 2024-02-29 NOTE — H&P (Signed)
 History and Physical    Veronica Weber FMW:[id number] DOB: 11-06-1955 DOA: 02/29/2024  Patient coming from: Home.  Chief Complaint: Falls.  HPI: Veronica Weber is a 68 y.o. female with history of prior stroke, hypertension, hyperlipidemia was brought to the ER after patient has been having frequent falls and weakness.  As per the patient patient has been having frequent falls when she tries to ambulate over the last 1 month.  About 4 days ago she had a fall and was lying on the floor for almost 12 hours and her neighbor came to check on her and helped her back to the bed.  Today her son brought her to the ER because of the weakness.  Patient states over the last 1 week she has been having multiple episodes of watery diarrhea which has resolved after she took Imodium.  Denies any recent travel sick contacts or using antibiotics.  Patient denies any chest pain.  Has been having some shortness of breath.  ED Course: In the ER initially patient was hypotensive with a systolic in the 80s.  Improved with fluid bolus.  Labs show elevated creatinine when compared to the one in January 2025.  LFTs also elevated.  EKG shows sinus tachycardia with low voltage.  Patient admitted for further management of acute renal failure with elevated LFTs and frequent falls and hypoxia.  Patient was requiring 2 L oxygen.  Chest x-ray unremarkable.  CT of the head did not show anything acute.  Review of Systems: As per HPI, rest all negative.   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 Her smoking use included cigarettes. She started smoking about 47 years ago. She has a 46 pack-year smoking history. She has never used smokeless tobacco. She reports that she does not currently use alcohol. She reports that she does not use drugs.  No Known Allergies  Family History  Problem Relation Age of Onset   Arthritis Mother    Cancer Maternal Grandmother    Diabetes Maternal Grandmother    Diabetes Maternal Grandfather    Healthy Daughter    Healthy Son     Prior to Admission medications   Medication Sig Start Date End Date Taking? Authorizing Provider  aspirin  81 MG chewable tablet Chew 1 tablet (81 mg total) by mouth 2 (two) times daily. Patient taking differently: Chew 81 mg by mouth daily. 05/25/22  Yes Genelle Standing, MD  atorvastatin  (LIPITOR ) 80 MG tablet TAKE 1 TABLET BY MOUTH EVERY MORNING 06/17/23  Yes Allwardt, Alyssa M, PA-C  chlorthalidone  (HYGROTON ) 25 MG tablet TAKE 1/2 TABLET BY MOUTH EVERY MORNING 03/26/23  Yes Allwardt, Alyssa M, PA-C  gabapentin  (NEURONTIN ) 300 MG capsule Take 2 capsules (600 mg total) by mouth at bedtime.  TAKE 1 TO 2 CAPSULES BY MOUTH AT bedtime AS DIRECTED 12/29/23 03/28/24 Yes Allwardt, Alyssa M, PA-C  losartan  (COZAAR ) 50 MG tablet TAKE 1 TABLET BY MOUTH DAILY 12/22/23  Yes Allwardt, Alyssa M, PA-C  Multiple Vitamins-Minerals (MULTIVITAMIN WITH MINERALS) tablet Take 1 tablet by mouth daily.   Yes [provider]  levothyroxine  (SYNTHROID ) 25 MCG tablet TAKE 1 TABLET BY MOUTH DAILY  BEFORE BREAKFAST TAKE ON AN  EMPTY STOMACH Patient not taking: Reported on 02/29/2024 10/22/23   Allwardt, Mardy HERO, PA-C  mirabegron  ER (MYRBETRIQ ) 25 MG TB24 tablet Take 1 tablet (25 mg total) by mouth daily. Take for overactive bladder. Patient not taking: Reported on  02/29/2024 10/27/23   Allwardt, Alyssa M, PA-C  naltrexone  (DEPADE) 50 MG tablet TAKE 1/2 TABLET BY MOUTH ONCE DAILY Patient taking differently: Take 25 mg by mouth daily as needed (for overdose). 05/14/23   Raulkar, Sven SQUIBB, MD    Physical Exam: Constitutional: Moderately built and nourished. Vitals:   02/29/24 2000 02/29/24 2015 02/29/24 2026 02/29/24 2030  BP: 115/74 114/74  102/69  Pulse: (!) 101 (!) 102  100  Resp:      Temp:   98.4 F (36.9 C)   TempSrc:   Oral   SpO2: 99% 99%  99%   Eyes: Anicteric no pallor. ENMT: No discharge from the ears eyes nose and mouth. Neck: No mass felt.  No JVD appreciated. Respiratory: No rhonchi or crepitations. Cardiovascular: S1-S2 heard. Abdomen: Soft nontender bowel sound present. Musculoskeletal: No edema. Skin: No rash. Neurologic: Alert awake oriented to time place and person.  Moves all extremities 5 x 5.  No facial asymmetry tongue is midline pupils equal and reacting to light. Psychiatric: Appears normal.  Normal affect.   Labs on Admission: I have personally reviewed following labs and imaging studies  CBC: Recent Labs  Lab 02/29/24 1407  WBC 14.5*  HGB 13.8  HCT 41.7  MCV 90.8  PLT 172   Basic Metabolic Panel: Recent Labs  Lab 02/29/24 1407  NA 136  K 3.6  CL 100  CO2 25  GLUCOSE 130*  BUN 59*  CREATININE 1.79*  CALCIUM  9.3   GFR: CrCl cannot be calculated (Unknown ideal weight.). Liver Function Tests: Recent Labs  Lab 02/29/24 1626  AST 122*  ALT 79*  ALKPHOS 102  BILITOT 1.0  PROT 6.8  ALBUMIN 2.9*   No results for input(s): LIPASE, AMYLASE in the last 168 hours. No results for input(s): AMMONIA in the last 168 hours. Coagulation Profile: No results for input(s): INR, PROTIME in the last 168 hours. Cardiac Enzymes: No results for input(s): CKTOTAL, CKMB, CKMBINDEX, TROPONINI in the last 168 hours. BNP (last 3 results) No results for input(s): PROBNP in the last 8760  hours. HbA1C: No results for input(s): HGBA1C in the last 72 hours. CBG: No results for input(s): GLUCAP in the last 168 hours. Lipid Profile: No results for input(s): CHOL, HDL, LDLCALC, TRIG, CHOLHDL, LDLDIRECT in the last 72 hours. Thyroid  Function Tests: No results for input(s): TSH, T4TOTAL, FREET4, T3FREE, THYROIDAB in the last 72 hours. Anemia Panel: No results for input(s): VITAMINB12, FOLATE, FERRITIN, TIBC, IRON, RETICCTPCT in the last 72 hours. Urine analysis:    Component Value Date/Time   COLORURINE YELLOW 11/18/2021 0100   APPEARANCEUR CLEAR 11/18/2021 0100   LABSPEC 1.016 11/18/2021 0100   PHURINE 5.0 11/18/2021 0100   GLUCOSEU NEGATIVE 11/18/2021 0100   HGBUR NEGATIVE 11/18/2021 0100   BILIRUBINUR NEGATIVE 11/18/2021 0100   BILIRUBINUR  negative 10/15/2020 1151   KETONESUR NEGATIVE 11/18/2021 0100   PROTEINUR NEGATIVE 11/18/2021 0100   UROBILINOGEN 0.2 10/15/2020 1151   NITRITE NEGATIVE 11/18/2021 0100   LEUKOCYTESUR NEGATIVE 11/18/2021 0100   Sepsis Labs: @LABRCNTIP (procalcitonin:4,lacticidven:4) )No results found for this or any previous visit (from the past 240 hours).   Radiological Exams on Admission: CT HEAD WO CONTRAST ( ) Result Date: 02/29/2024 CLINICAL DATA:  Mental status change, confusion, dizziness. EXAM: CT HEAD WITHOUT CONTRAST TECHNIQUE: Contiguous axial images were obtained from the base of the skull through the vertex without intravenous contrast. RADIATION DOSE REDUCTION: This exam was performed according to the departmental dose-optimization program which includes automated exposure control, adjustment of the mA and/or kV according to patient size and/or use of iterative reconstruction technique. COMPARISON:  MRI head 11/17/2021. FINDINGS: Brain: No acute intracranial hemorrhage. No CT evidence of acute infarct. Nonspecific hypoattenuation in the periventricular and subcortical white matter favored to reflect  chronic microvascular ischemic changes. Remote infarcts in the bilateral parietal lobes. Generalized parenchymal volume loss. No edema, mass effect, or midline shift. The basilar cisterns are patent. Ventricles: The ventricles are normal. Vascular: Atherosclerotic calcifications of the carotid siphons and intracranial vertebral arteries. No hyperdense vessel. Skull: No acute or aggressive finding. Orbits: Bilateral lens replacement. Sinuses: Mild mucosal thickening in the left maxillary sinus. Other: Mastoid air cells are clear. IMPRESSION: No acute intracranial abnormality. Extensive chronic microvascular ischemic changes and mild parenchymal volume loss. Remote infarcts in the bilateral parietal lobes. Electronically Signed   By: Donnice Mania M.D.   On: 02/29/2024 16:52   DG Chest 2 View Result Date: 02/29/2024 CLINICAL DATA:  Shortness of breath EXAM: CHEST - 2 VIEW COMPARISON:  CT 08/11/2022 FINDINGS: Heart and mediastinal contours are within normal limits. No focal opacities or effusions. No acute bony abnormality. IMPRESSION: No active cardiopulmonary disease. Electronically Signed   By: Franky Crease M.D.   On: 02/29/2024 15:01    EKG: Independently reviewed.  Sinus tachycardia.  Low voltage.  Assessment/Plan Principal Problem:   ARF (acute renal failure) (HCC) Active Problems:   Essential hypertension   Hyperlipidemia   History of stroke   Hypotension    Acute renal failure could be from recent diarrhea and hypotension in the setting of hydrochlorothiazide and ARB.  Continue with gentle hydration hold ARB and hydrochlorothiazide.  Follow metabolic panel after hydration.  Check UA. Hypotension likely from dehydration.  Hydrate and follow blood pressure trends.  Hold antihypertensives for now. Frequent falls could be from hypotension.  Check orthostatics after hydration. Elevated LFTs cause not clear.  Check CK levels to make sure there is no rhabdomyolysis given the recent falls.  Check  acute hepatitis panel.  Follow LFTs.  Check right upper quadrant ultrasound. Hypertension holding antihypertensives due to hypotensive at presentation and also acute renal failure. History of stroke on statins and aspirin .  Need to hold statins if LFTs worsen. Hypoxia patient is on 2 L oxygen.  Will check D-dimer and if positive will get CT angiogram of the chest if creatinine improves otherwise VQ scan.  Check 2D echo given that patient is low voltage in the EKG. Diarrhea has resolved. History of headaches on gabapentin .  Dose may need to be decreased if creatinine does not improve.  PDMP website verified.  Since patient has acute renal failure with hypotension and frequent falls will need further workup and more than 2 midnight stay.   DVT prophylaxis: Lovenox . Code Status: Full code. Family Communication: Discussed with patient. Disposition Plan: Monitored  bed. Consults called: Physical therapy. Admission status: Observation.

## 2024-03-01 ENCOUNTER — Observation Stay (HOSPITAL_COMMUNITY)

## 2024-03-01 ENCOUNTER — Inpatient Hospital Stay (HOSPITAL_COMMUNITY)

## 2024-03-01 ENCOUNTER — Telehealth (HOSPITAL_COMMUNITY): Payer: Self-pay | Admitting: Pharmacy Technician

## 2024-03-01 ENCOUNTER — Other Ambulatory Visit (HOSPITAL_COMMUNITY): Payer: Self-pay

## 2024-03-01 DIAGNOSIS — I82432 Acute embolism and thrombosis of left popliteal vein: Secondary | ICD-10-CM | POA: Diagnosis present

## 2024-03-01 DIAGNOSIS — I2699 Other pulmonary embolism without acute cor pulmonale: Secondary | ICD-10-CM | POA: Diagnosis present

## 2024-03-01 DIAGNOSIS — M7989 Other specified soft tissue disorders: Secondary | ICD-10-CM

## 2024-03-01 DIAGNOSIS — R519 Headache, unspecified: Secondary | ICD-10-CM | POA: Insufficient documentation

## 2024-03-01 DIAGNOSIS — R1310 Dysphagia, unspecified: Secondary | ICD-10-CM | POA: Diagnosis not present

## 2024-03-01 DIAGNOSIS — Z743 Need for continuous supervision: Secondary | ICD-10-CM | POA: Diagnosis not present

## 2024-03-01 DIAGNOSIS — R7989 Other specified abnormal findings of blood chemistry: Secondary | ICD-10-CM | POA: Insufficient documentation

## 2024-03-01 DIAGNOSIS — Z7901 Long term (current) use of anticoagulants: Secondary | ICD-10-CM | POA: Diagnosis not present

## 2024-03-01 DIAGNOSIS — Z79890 Hormone replacement therapy (postmenopausal): Secondary | ICD-10-CM | POA: Diagnosis not present

## 2024-03-01 DIAGNOSIS — Z7982 Long term (current) use of aspirin: Secondary | ICD-10-CM | POA: Diagnosis not present

## 2024-03-01 DIAGNOSIS — I82413 Acute embolism and thrombosis of femoral vein, bilateral: Secondary | ICD-10-CM | POA: Diagnosis not present

## 2024-03-01 DIAGNOSIS — G8929 Other chronic pain: Secondary | ICD-10-CM | POA: Diagnosis not present

## 2024-03-01 DIAGNOSIS — R278 Other lack of coordination: Secondary | ICD-10-CM | POA: Diagnosis not present

## 2024-03-01 DIAGNOSIS — I10 Essential (primary) hypertension: Secondary | ICD-10-CM | POA: Diagnosis not present

## 2024-03-01 DIAGNOSIS — J439 Emphysema, unspecified: Secondary | ICD-10-CM | POA: Diagnosis present

## 2024-03-01 DIAGNOSIS — R296 Repeated falls: Secondary | ICD-10-CM | POA: Diagnosis present

## 2024-03-01 DIAGNOSIS — I82409 Acute embolism and thrombosis of unspecified deep veins of unspecified lower extremity: Secondary | ICD-10-CM | POA: Diagnosis not present

## 2024-03-01 DIAGNOSIS — E785 Hyperlipidemia, unspecified: Secondary | ICD-10-CM | POA: Diagnosis not present

## 2024-03-01 DIAGNOSIS — Z87891 Personal history of nicotine dependence: Secondary | ICD-10-CM | POA: Diagnosis not present

## 2024-03-01 DIAGNOSIS — E861 Hypovolemia: Secondary | ICD-10-CM | POA: Diagnosis present

## 2024-03-01 DIAGNOSIS — Z6841 Body Mass Index (BMI) 40.0 and over, adult: Secondary | ICD-10-CM | POA: Diagnosis not present

## 2024-03-01 DIAGNOSIS — R0902 Hypoxemia: Secondary | ICD-10-CM | POA: Insufficient documentation

## 2024-03-01 DIAGNOSIS — I499 Cardiac arrhythmia, unspecified: Secondary | ICD-10-CM | POA: Diagnosis not present

## 2024-03-01 DIAGNOSIS — E876 Hypokalemia: Secondary | ICD-10-CM | POA: Diagnosis present

## 2024-03-01 DIAGNOSIS — E039 Hypothyroidism, unspecified: Secondary | ICD-10-CM | POA: Diagnosis not present

## 2024-03-01 DIAGNOSIS — Z741 Need for assistance with personal care: Secondary | ICD-10-CM | POA: Diagnosis not present

## 2024-03-01 DIAGNOSIS — M549 Dorsalgia, unspecified: Secondary | ICD-10-CM | POA: Diagnosis not present

## 2024-03-01 DIAGNOSIS — R531 Weakness: Secondary | ICD-10-CM | POA: Diagnosis not present

## 2024-03-01 DIAGNOSIS — M47816 Spondylosis without myelopathy or radiculopathy, lumbar region: Secondary | ICD-10-CM | POA: Diagnosis not present

## 2024-03-01 DIAGNOSIS — J9601 Acute respiratory failure with hypoxia: Secondary | ICD-10-CM | POA: Diagnosis not present

## 2024-03-01 DIAGNOSIS — Z8673 Personal history of transient ischemic attack (TIA), and cerebral infarction without residual deficits: Secondary | ICD-10-CM | POA: Diagnosis not present

## 2024-03-01 DIAGNOSIS — M6259 Muscle wasting and atrophy, not elsewhere classified, multiple sites: Secondary | ICD-10-CM | POA: Diagnosis not present

## 2024-03-01 DIAGNOSIS — I714 Abdominal aortic aneurysm, without rupture, unspecified: Secondary | ICD-10-CM | POA: Diagnosis not present

## 2024-03-01 DIAGNOSIS — I959 Hypotension, unspecified: Secondary | ICD-10-CM | POA: Diagnosis not present

## 2024-03-01 DIAGNOSIS — E860 Dehydration: Secondary | ICD-10-CM | POA: Diagnosis present

## 2024-03-01 DIAGNOSIS — M4856XA Collapsed vertebra, not elsewhere classified, lumbar region, initial encounter for fracture: Secondary | ICD-10-CM | POA: Diagnosis not present

## 2024-03-01 DIAGNOSIS — R55 Syncope and collapse: Secondary | ICD-10-CM | POA: Diagnosis not present

## 2024-03-01 DIAGNOSIS — M1990 Unspecified osteoarthritis, unspecified site: Secondary | ICD-10-CM | POA: Diagnosis not present

## 2024-03-01 DIAGNOSIS — Z9049 Acquired absence of other specified parts of digestive tract: Secondary | ICD-10-CM | POA: Diagnosis not present

## 2024-03-01 DIAGNOSIS — Z833 Family history of diabetes mellitus: Secondary | ICD-10-CM | POA: Diagnosis not present

## 2024-03-01 DIAGNOSIS — Z8261 Family history of arthritis: Secondary | ICD-10-CM | POA: Diagnosis not present

## 2024-03-01 DIAGNOSIS — I82442 Acute embolism and thrombosis of left tibial vein: Secondary | ICD-10-CM | POA: Diagnosis present

## 2024-03-01 DIAGNOSIS — N3281 Overactive bladder: Secondary | ICD-10-CM | POA: Diagnosis not present

## 2024-03-01 DIAGNOSIS — K760 Fatty (change of) liver, not elsewhere classified: Secondary | ICD-10-CM | POA: Diagnosis not present

## 2024-03-01 DIAGNOSIS — N179 Acute kidney failure, unspecified: Secondary | ICD-10-CM | POA: Diagnosis not present

## 2024-03-01 DIAGNOSIS — W19XXXD Unspecified fall, subsequent encounter: Secondary | ICD-10-CM | POA: Diagnosis not present

## 2024-03-01 DIAGNOSIS — R945 Abnormal results of liver function studies: Secondary | ICD-10-CM | POA: Diagnosis not present

## 2024-03-01 DIAGNOSIS — M545 Low back pain, unspecified: Secondary | ICD-10-CM | POA: Diagnosis not present

## 2024-03-01 DIAGNOSIS — R262 Difficulty in walking, not elsewhere classified: Secondary | ICD-10-CM | POA: Diagnosis not present

## 2024-03-01 LAB — HEPATIC FUNCTION PANEL
ALT: 70 U/L — ABNORMAL HIGH (ref 0–44)
AST: 104 U/L — ABNORMAL HIGH (ref 15–41)
Albumin: 2.5 g/dL — ABNORMAL LOW (ref 3.5–5.0)
Alkaline Phosphatase: 89 U/L (ref 38–126)
Bilirubin, Direct: 0.2 mg/dL (ref 0.0–0.2)
Indirect Bilirubin: 0.7 mg/dL (ref 0.3–0.9)
Total Bilirubin: 0.9 mg/dL (ref 0.0–1.2)
Total Protein: 5.9 g/dL — ABNORMAL LOW (ref 6.5–8.1)

## 2024-03-01 LAB — ECHOCARDIOGRAM COMPLETE
Area-P 1/2: 4.06 cm2
Height: 60 in
S' Lateral: 2.5 cm
Weight: 3844.82 [oz_av]

## 2024-03-01 LAB — BASIC METABOLIC PANEL WITH GFR
Anion gap: 11 (ref 5–15)
BUN: 45 mg/dL — ABNORMAL HIGH (ref 8–23)
CO2: 22 mmol/L (ref 22–32)
Calcium: 8.7 mg/dL — ABNORMAL LOW (ref 8.9–10.3)
Chloride: 106 mmol/L (ref 98–111)
Creatinine, Ser: 1.2 mg/dL — ABNORMAL HIGH (ref 0.44–1.00)
GFR, Estimated: 49 mL/min — ABNORMAL LOW (ref >60)
Glucose, Bld: 99 mg/dL (ref 70–99)
Potassium: 3.4 mmol/L — ABNORMAL LOW (ref 3.5–5.1)
Sodium: 139 mmol/L (ref 135–145)

## 2024-03-01 LAB — D-DIMER, QUANTITATIVE: D-Dimer, Quant: 8.62 ug{FEU}/mL — ABNORMAL HIGH (ref 0.00–0.50)

## 2024-03-01 LAB — HEPATITIS PANEL, ACUTE
HCV Ab: NONREACTIVE
Hep A IgM: NONREACTIVE
Hep B C IgM: NONREACTIVE
Hepatitis B Surface Ag: NONREACTIVE

## 2024-03-01 LAB — T4, FREE: Free T4: 1.09 ng/dL (ref 0.61–1.12)

## 2024-03-01 LAB — CBC
HCT: 37.3 % (ref 36.0–46.0)
Hemoglobin: 12.2 g/dL (ref 12.0–15.0)
MCH: 29.9 pg (ref 26.0–34.0)
MCHC: 32.7 g/dL (ref 30.0–36.0)
MCV: 91.4 fL (ref 80.0–100.0)
Platelets: 161 10*3/uL (ref 150–400)
RBC: 4.08 MIL/uL (ref 3.87–5.11)
RDW: 14.5 % (ref 11.5–15.5)
WBC: 9.8 10*3/uL (ref 4.0–10.5)
nRBC: 0.3 % — ABNORMAL HIGH (ref 0.0–0.2)

## 2024-03-01 LAB — HIV ANTIBODY (ROUTINE TESTING W REFLEX): HIV Screen 4th Generation wRfx: NONREACTIVE

## 2024-03-01 LAB — GLUCOSE, CAPILLARY: Glucose-Capillary: 132 mg/dL — ABNORMAL HIGH (ref 70–99)

## 2024-03-01 LAB — TSH: TSH: 9.674 u[IU]/mL — ABNORMAL HIGH (ref 0.350–4.500)

## 2024-03-01 LAB — HEPARIN LEVEL (UNFRACTIONATED): Heparin Unfractionated: 0.29 [IU]/mL — ABNORMAL LOW (ref 0.30–0.70)

## 2024-03-01 LAB — CK: Total CK: 333 U/L — ABNORMAL HIGH (ref 38–234)

## 2024-03-01 MED ORDER — HEPARIN BOLUS VIA INFUSION
2000.0000 [IU] | Freq: Once | INTRAVENOUS | Status: AC
  Administered 2024-03-01: 2000 [IU] via INTRAVENOUS
  Filled 2024-03-01: qty 2000

## 2024-03-01 MED ORDER — TECHNETIUM TO 99M ALBUMIN AGGREGATED
4.4000 | Freq: Once | INTRAVENOUS | Status: AC | PRN
  Administered 2024-03-01: 4.4 via INTRAVENOUS

## 2024-03-01 MED ORDER — LEVOTHYROXINE SODIUM 25 MCG PO TABS
25.0000 ug | ORAL_TABLET | Freq: Every day | ORAL | Status: DC
  Administered 2024-03-02 – 2024-03-07 (×6): 25 ug via ORAL
  Filled 2024-03-01 (×6): qty 1

## 2024-03-01 MED ORDER — GABAPENTIN 300 MG PO CAPS
600.0000 mg | ORAL_CAPSULE | Freq: Every day | ORAL | Status: DC
  Administered 2024-03-01 – 2024-03-06 (×6): 600 mg via ORAL
  Filled 2024-03-01 (×6): qty 2

## 2024-03-01 MED ORDER — HEPARIN (PORCINE) 25000 UT/250ML-% IV SOLN
1250.0000 [IU]/h | INTRAVENOUS | Status: DC
  Administered 2024-03-01: 1200 [IU]/h via INTRAVENOUS
  Administered 2024-03-02 – 2024-03-03 (×2): 1250 [IU]/h via INTRAVENOUS
  Filled 2024-03-01 (×3): qty 250

## 2024-03-01 MED ORDER — POTASSIUM CHLORIDE CRYS ER 20 MEQ PO TBCR
40.0000 meq | EXTENDED_RELEASE_TABLET | Freq: Once | ORAL | Status: AC
  Administered 2024-03-01: 40 meq via ORAL
  Filled 2024-03-01: qty 2

## 2024-03-01 MED ORDER — LACTATED RINGERS IV SOLN: INTRAVENOUS | Status: DC

## 2024-03-01 MED ORDER — PERFLUTREN LIPID MICROSPHERE
1.0000 mL | INTRAVENOUS | Status: AC | PRN
  Administered 2024-03-01: 5 mL via INTRAVENOUS

## 2024-03-01 NOTE — Progress Notes (Signed)
 Transition of Care [facility]) - Inpatient Brief Assessment   Patient Details  Name: Veronica Weber MRN: [id number] Date of Birth: 13-Sep-1955  Transition of [facility] CM/SW Contact:    Rosaline JONELLE Joe, RN Phone Number: 03/01/2024, 3:22 PM   Clinical Narrative: Patient admitted to the hospital after fall at home.  Patient admitted for renal failure and is currently in the hospital on Oxygen in the hospital.  Patient lives at home alone and states that she is normally on RA at home.  Patient has son in [place] and mother in [place] area.  Patient is a non-smoker and states she quit 45 years ago.  DME at the home includes RW, Cane, 3:1.  Patient is agreeable to SNF placement.  FL2 completed and patient was faxed out in the hub for bed offers.   Transition of Care Asessment: Insurance and Status: (P) Insurance coverage has been reviewed Patient has primary care physician: (P) Yes Home environment has been reviewed: (P) from home alone Prior level of function:: (P) RW, Hotel manager Home Services: (P) No current home services Social Drivers of Health Review: (P) SDOH reviewed needs interventions Readmission risk has been reviewed: (P) Yes Transition of care needs: (P) transition of care needs identified, TOC will continue to follow

## 2024-03-01 NOTE — Progress Notes (Signed)
 BLE venous duplex has been completed.  Preliminary findings given to Dr. Regalado.   Results can be found under chart review under CV PROC. 03/01/2024 10:23 AM Elvin Mccartin RVT, RDMS

## 2024-03-01 NOTE — Telephone Encounter (Signed)
 Patient Product/process development scientist completed.    The patient is insured through [facility]. Patient has Medicare and is not eligible for a copay card, but may be able to apply for patient assistance or Medicare RX Payment Plan (Patient Must reach out to their plan, if eligible for payment plan), if available.    Ran test claim for Eliquis 5 mg and the current 30 day co-pay is $298.34 due to a deductilbe.  Ran test claim for Xarelto 20 mg and the current 30 day co-pay is $298.34 due to a deductilbe.  This test claim was processed through [facility]- copay amounts may vary at other pharmacies due to pharmacy/plan contracts, or as the patient moves through the different stages of their insurance plan.     Veronica Weber, CPHT Pharmacy Technician III Certified Patient Advocate [facility] Patient Advocate Team Direct Number: [phone]  Fax: [phone]

## 2024-03-01 NOTE — ED Notes (Signed)
 Placed purewick on pt and gave a new gown

## 2024-03-01 NOTE — NC FL2 (Addendum)
 [place]  MEDICAID FL2 LEVEL OF CARE FORM     IDENTIFICATION  Patient Name: Veronica Weber Birthdate: 07/17/1956 Sex: female Admission Date (Current Location): 02/29/2024  [facility] and [place] Number:  Producer, television/film/video and Address:  [facility] [place], [address], [place], [place] [address]      Provider Number: [id number]  Attending Physician Name and Address:  Madelyne Owen LABOR, MD  Relative Name and Phone Number:       Current Level of Care: Hospital Recommended Level of Care: Skilled Nursing Facility Prior Approval Number:    Date Approved/Denied:   PASRR Number:  [id number]   Discharge Plan: SNF    Current Diagnoses: Patient Active Problem List   Diagnosis Date Noted   Elevated LFTs 03/01/2024   Hypoxia 03/01/2024   Headache 03/01/2024   ARF (acute renal failure) (HCC) 02/29/2024   History of stroke 02/29/2024   Hypotension 02/29/2024   Prediabetes 02/24/2023   Bilateral foot pain 08/26/2022   Overactive bladder 08/26/2022   Former smoker 08/26/2022   OA (osteoarthritis) of knee 05/22/2022   Arthritis of knee 05/22/2022   Current every day smoker 01/19/2022   Difficulty sleeping 01/19/2022   Obesity, Class III, BMI 40-49.9 (morbid obesity) 11/19/2021   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   Acute CVA (cerebrovascular accident) (HCC) 11/18/2021   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   Essential hypertension 11/16/2021   Hyperlipidemia 11/16/2021   Tobacco use 11/16/2021   Syncope and collapse 06/26/2021    Orientation RESPIRATION BLADDER Height & Weight     Self, Time, Situation, Place  O2 Continent Weight: 109 kg Height:  5' (152.4 cm)  BEHAVIORAL SYMPTOMS/MOOD NEUROLOGICAL BOWEL NUTRITION STATUS      Continent Diet (See Discharge Summary)  AMBULATORY STATUS COMMUNICATION OF NEEDS Skin   Limited Assist Verbally Bruising                       Personal Care Assistance Level of Assistance   Bathing, Feeding, Dressing Bathing Assistance: Limited assistance Feeding assistance: Independent Dressing Assistance: Limited assistance     Functional Limitations Info  Sight, Hearing, Speech Sight Info: Adequate Hearing Info: Adequate Speech Info: Adequate    SPECIAL CARE FACTORS FREQUENCY  PT (By licensed PT), OT (By licensed OT)     PT Frequency: 3-5 x per week OT Frequency: 3-5 x per week            Contractures      Additional Factors Info  Code Status, Allergies Code Status Info: Full Code Allergies Info: NKDA           Current Medications (03/01/2024):  This is the current hospital active medication list Current Facility-Administered Medications  Medication Dose Route Frequency Provider Last Rate Last Admin   aspirin  chewable tablet 81 mg  81 mg Oral Daily Kakrakandy, Arshad N, MD   81 mg at 03/01/24 9087   gabapentin  (NEURONTIN ) capsule 600 mg  600 mg Oral QHS Regalado, Belkys A, MD       heparin  ADULT infusion 100 units/mL (25000 units/250mL)  1,200 Units/hr Intravenous Continuous Regalado, Belkys A, MD 12 mL/hr at 03/01/24 1441 1,200 Units/hr at 03/01/24 1441   lactated ringers  infusion   Intravenous Continuous Regalado, Belkys A, MD 100 mL/hr at 03/01/24 0910 New Bag at 03/01/24 0910   [START ON 03/02/2024] levothyroxine  (SYNTHROID ) tablet 25 mcg  25 mcg Oral Q0600 Regalado, Belkys A, MD  Discharge Medications: Please see discharge summary for a list of discharge medications.  Relevant Imaging Results:  Relevant Lab Results:   Additional Information SS# [id number]  Rosaline JONELLE Joe, RN

## 2024-03-01 NOTE — Progress Notes (Signed)
 PHARMACY - ANTICOAGULATION CONSULT NOTE  Pharmacy Consult for Iv heparin  Indication: PE r/o  No Known Allergies  Patient Measurements: Height: 5' (152.4 cm) Weight: 109 kg (240 lb 4.8 oz) IBW/kg (Calculated) : 45.5 HEPARIN DW (KG): 72.5  Vital Signs: Temp: 97.9 F (36.6 C) (07/01 0609) Temp Source: Oral (07/01 0609) BP: 119/86 (07/01 0500) Pulse Rate: 94 (07/01 0500)  Labs: Recent Labs    02/29/24 1407 03/01/24 0542  HGB 13.8 12.2  HCT 41.7 37.3  PLT 172 161  CREATININE 1.79* 1.20*  CKTOTAL  --  333*    Estimated Creatinine Clearance: 50.2 mL/min (A) (by C-G formula based on SCr of 1.2 mg/dL (H)).   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 year old female admitted on 02/29/2024 with concern for PE with elevated d-dimer and VQ scan pending . No anticoagulation prior to admission. Of note, pt did get enoxaparin  30mg  SQ for DVT ppx on 6/30 2200. Pharmacy consulted to dose heparin.  Goal of Therapy:  Heparin level 0.3-0.7 units/ml Monitor platelets by anticoagulation protocol: Yes   Plan:  Heparin 2000 units x 1 as bolus followed by heparin infusion at 1200 units/hr 8h heparin level  Daily heparin level, CBC, and monitoring for bleeding F/u VQ scan results and plans for anticoagulation   Thank you for allowing pharmacy to participate in this patient's care.  Leonor GORMAN Bash, PharmD Emergency Medicine Clinical Pharmacist 03/01/2024,10:21 AM

## 2024-03-01 NOTE — Evaluation (Signed)
 Physical Therapy Evaluation Patient Details Name: Veronica Weber MRN: [id number] DOB: 1956/06/09 Today's Date: 03/01/2024  History of Present Illness  Pt is a 68 y.o. female admitted 6/30 with acute renal failure. She fell at home and was unable to get up off the floor. PMH: HTN, CVA, tobacco use, L TKA (2023), obesity, OA  Clinical Impression  Pt admitted with above diagnosis. PTA pt lived alone, mod I mobility with RW, driving. Pt reports recent frequent falls.  Pt currently with functional limitations due to the deficits listed below (see PT Problem List). On eval, pt required min assist bed mobility, min assist transfers, and CGA amb 5' with RW. Good sitting balance and poor standing balance. Generalized weakness and decreased activity tolerance. VSS on 1 L. (Pt not on home O2.) Pt limited by lines and stretcher in ED. Pt will benefit from acute skilled PT to increase their independence and safety with mobility to allow discharge. Post acute, pt would benefit from further therapy in inpatient setting < 3 hours/day. If pt has family that could provide 24-hour assist, HHPT would be appropriate.         If plan is discharge home, recommend the following: A little help with walking and/or transfers;A little help with bathing/dressing/bathroom;Assistance with cooking/housework;Assist for transportation;Help with stairs or ramp for entrance   Can travel by private vehicle   Yes    Equipment Recommendations None recommended by PT  Recommendations for Other Services       Functional Status Assessment Patient has had a recent decline in their functional status and demonstrates the ability to make significant improvements in function in a reasonable and predictable amount of time.     Precautions / Restrictions Precautions Precautions: Fall      Mobility  Bed Mobility Overal bed mobility: Needs Assistance Bed Mobility: Supine to Sit, Sit to Supine     Supine to sit: Min assist,  Used rails Sit to supine: Min assist, Used rails   General bed mobility comments: assist to elevate trunk, assist with BLE back to stretcher    Transfers Overall transfer level: Needs assistance Equipment used: Rolling walker (2 wheels) Transfers: Sit to/from Stand Sit to Stand: Min assist           General transfer comment: primarily needing assist due to only 5' and getting on/off stretcher in ED    Ambulation/Gait Ambulation/Gait assistance: Contact guard assist Gait Distance (Feet): 5 Feet Assistive device: Rolling walker (2 wheels) Gait Pattern/deviations: Step-through pattern, Decreased stride length       General Gait Details: distance limited by lines and fatigue  Stairs            Wheelchair Mobility     Tilt Bed    Modified Rankin (Stroke Patients Only)       Balance Overall balance assessment: Needs assistance Sitting-balance support: No upper extremity supported, Feet unsupported Sitting balance-Leahy Scale: Good     Standing balance support: Bilateral upper extremity supported, During functional activity, Reliant on assistive device for balance Standing balance-Leahy Scale: Poor                               Pertinent Vitals/Pain Pain Assessment Pain Assessment: Faces Faces Pain Scale: Hurts a little bit Pain Location: generalized Pain Descriptors / Indicators: Discomfort, Grimacing Pain Intervention(s): Monitored during session, Limited activity within patient's tolerance    Home Living Family/patient expects to be discharged to:: Private residence Living  Arrangements: Alone Available Help at Discharge: Family;Available PRN/intermittently Type of Home: House Home Access: Stairs to enter Entrance Stairs-Rails: Right;Left;Can reach both Entrance Stairs-Number of Steps: 5   Home Layout: One level Home Equipment: Agricultural consultant (2 wheels);Cane - single point;BSC/3in1      Prior Function Prior Level of Function :  Independent/Modified Independent;Driving;History of Falls (last six months)             Mobility Comments: RW in house, uses shopping cart in grocery store, multi recent falls ADLs Comments: mod I, reports she has not been able to clean her house for some time     Extremity/Trunk Assessment   Upper Extremity Assessment Upper Extremity Assessment: Generalized weakness    Lower Extremity Assessment Lower Extremity Assessment: Generalized weakness    Cervical / Trunk Assessment Cervical / Trunk Assessment: Normal  Communication   Communication Communication: No apparent difficulties    Cognition Arousal: Alert Behavior During Therapy: WFL for tasks assessed/performed   PT - Cognitive impairments: No apparent impairments                         Following commands: Intact       Cueing Cueing Techniques: Verbal cues     General Comments General comments (skin integrity, edema, etc.): Resting HR in 90s. HR into 110s with activity. SpO2 in 90s on 1L    Exercises     Assessment/Plan    PT Assessment Patient needs continued PT services  PT Problem List Decreased strength;Decreased balance;Pain;Decreased mobility;Obesity;Decreased activity tolerance       PT Treatment Interventions Therapeutic activities;Gait training;Therapeutic exercise;Patient/family education;Balance training;Stair training;Functional mobility training    PT Goals (Current goals can be found in the Care Plan section)  Acute Rehab PT Goals Patient Stated Goal: get stronger and stop falling PT Goal Formulation: With patient Time For Goal Achievement: 03/15/24 Potential to Achieve Goals: Good    Frequency Min 2X/week     Co-evaluation               AM-PAC PT 6 Clicks Mobility  Outcome Measure Help needed turning from your back to your side while in a flat bed without using bedrails?: A Little Help needed moving from lying on your back to sitting on the side of a flat bed  without using bedrails?: A Little Help needed moving to and from a bed to a chair (including a wheelchair)?: A Little Help needed standing up from a chair using your arms (e.g., wheelchair or bedside chair)?: A Little Help needed to walk in hospital room?: A Little Help needed climbing 3-5 steps with a railing? : A Lot 6 Click Score: 17    End of Session Equipment Utilized During Treatment: Gait belt;Oxygen Activity Tolerance: Patient tolerated treatment well Patient left: in bed;with call bell/phone within reach Nurse Communication: Mobility status PT Visit Diagnosis: Muscle weakness (generalized) (M62.81);Difficulty in walking, not elsewhere classified (R26.2);Repeated falls (R29.6)    Time: 9145-9084 PT Time Calculation (min) (ACUTE ONLY): 21 min   Charges:   PT Evaluation $PT Eval Moderate Complexity: 1 Mod   PT General Charges $$ ACUTE PT VISIT: 1 Visit         Sari MATSU., PT  Office # [phone]   Erven Sari Shaker 03/01/2024, 10:02 AM

## 2024-03-01 NOTE — Progress Notes (Signed)
 PHARMACY - ANTICOAGULATION CONSULT NOTE  Pharmacy Consult for Iv heparin  Indication: PE r/o  No Known Allergies  Patient Measurements: Height: 5' (152.4 cm) Weight: 109 kg (240 lb 4.8 oz) IBW/kg (Calculated) : 45.5 HEPARIN DW (KG): 72.5  Vital Signs: Temp: 98 F (36.7 C) (07/01 1355) BP: 98/56 (07/01 1758) Pulse Rate: 98 (07/01 1449)  Labs: Recent Labs    02/29/24 1407 03/01/24 0542 03/01/24 1750  HGB 13.8 12.2  --   HCT 41.7 37.3  --   PLT 172 161  --   HEPARINUNFRC  --   --  0.29*  CREATININE 1.79* 1.20*  --   CKTOTAL  --  333*  --     Estimated Creatinine Clearance: 50.2 mL/min (A) (by C-G formula based on SCr of 1.2 mg/dL (H)).   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 year old female admitted on 02/29/2024 with concern for PE with elevated d-dimer and VQ scan pending . No anticoagulation prior to admission. Of note, pt did get enoxaparin  30mg  SQ for DVT ppx on 6/30 2200. Pharmacy consulted to dose heparin.  2nd shift: Heparin level just below therapeutic at 0.29. V/Q results highly suspicious for PE. US  leg + DVT.   Goal of Therapy:  Heparin level 0.3-0.7 units/ml Monitor platelets by anticoagulation protocol: Yes   Plan:  Increase heparin infusion slightly to 1250 units/hr Confirmatory 8h heparin level in the AM  Daily heparin level, CBC, and monitoring for bleeding  Thank you for allowing pharmacy to participate in this patient's care.  Rankin Sams, PharmD, BCPS, BCCCP Clinical Pharmacist

## 2024-03-01 NOTE — Progress Notes (Signed)
 PROGRESS NOTE    Veronica Weber  FMW:[id number] DOB: [birth year] DOA: 02/29/2024 PCP: Kathrene Mardy HERO, PA-C   Brief Narrative: 68 year old with past medical history significant for stroke, hypertension, hyperlipidemia presents after having frequent fall and weakness.  4 days prior to admission she fell and was lying on the floor for 12 hours until her neighbor came and helped her.  She was brought to the ED ER by her son because of weakness.  She has been having watery diarrhea, resolved after she took Imodium.  Evaluation in the ED patient was noted to be hypotensive systolic blood pressure in the 80s, improved with IV fluids.  Presented with AKI, abnormal liver function test.  CT head no acute findings.  She was found to be hypoxic placed on 2 L of oxygen.   Assessment & Plan:   Principal Problem:   ARF (acute renal failure) (HCC) Active Problems:   Essential hypertension   Hyperlipidemia   History of stroke   Hypotension   Elevated LFTs   Hypoxia   Headache   1-Acute renal failure: -Presents with Cr 1.7, previous cr 0.7 -In the setting of dehydration, Diuretics (Hygroton , and Cozaar ) -Poor oral  intake -Continue with IV fluids.  -improving  Acute Hypoxic Respiratory failure -In setting of PE -Elevated D dimer; -Oxygen stable on 2 L -VQ scan: Large right lung perfusion defect, highly suspicious for pulmonary embolism (pulmonary embolism present by PISAPED criteria). -Doppler: Findings consistent with acute deep vein thrombosis involving the left  popliteal vein, and left posterior tibial veins.  -Started on Heparin Gtt.  -Follow ECHO  Hypotension: In setting dehydration, hypovolemia, improved with fluids.  Hold diuretics, Cozaar .  Continue to monitor.   Frequent fall: -Needs PT. Needs Rehab.   Transaminases:  -in setting of Hypotension.  -Hold statins.  -Hepatitis panel negative -US : Cholecystectomy with mildly prominent CBD, 1 mm larger since  January. No intrahepatic ductal dilatation to strongly suggest acute biliary obstruction. Hepatic Steatosis.  Hypothyroidism Elevated TSH She was not taking Synthroid .  Check Free T 3 and T 4.  Resume synthroid   Hypertension: -Hold diuretics, and Cozaar .   History of Stroke -Continue with aspirin .  Hold statins due to transaminases.   Diarrhea:  Has resolved.   History of Headaches.  Continue Gabapentin .   Hypokalemia; replete orally.   History of multilevel degenerative changes through lumbar spine.  Mild disc bulged.   Estimated body mass index is 47.07 kg/m as calculated from the following:   Height as of 12/29/23: 5' (1.524 m).   Weight as of 12/29/23: 109.3 kg.   DVT prophylaxis: heparin gtt  Code Status: Full code Family Communication: Son at bedside Disposition Plan:  Status is: Observation The patient will require care spanning > 2 midnights and should be moved to inpatient because: PE, DVT fall    Consultants:  none  Procedures:  ECHO Doppler: Findings consistent with acute deep vein thrombosis involving the left  popliteal vein, and left posterior tibial veins.   Antimicrobials:    Subjective: She is alert, no more diarrhea. Denies worsening dyspnea or chest pain.    Objective: Vitals:   03/01/24 0000 03/01/24 0215 03/01/24 0500 03/01/24 0609  BP: 115/82 122/79 119/86   Pulse: 100 99 94   Resp: 18 18 16    Temp: 98.5 F (36.9 C) 98.5 F (36.9 C)  97.9 F (36.6 C)  TempSrc:    Oral  SpO2: 100% 99% 100%     Intake/Output Summary (Last 24 hours) at  03/01/2024 0748 Last data filed at 02/29/2024 2202 Gross per 24 hour  Intake 1500 ml  Output --  Net 1500 ml   There were no vitals filed for this visit.  Examination:  General exam: Appears calm and comfortable  Respiratory system: Clear to auscultation. Respiratory effort normal. Cardiovascular system: S1 & S2 heard, RRR. No JVD, murmurs, rubs, gallops or clicks. No pedal  edema. Gastrointestinal system: Abdomen is nondistended, soft and nontender. No organomegaly or masses felt. Normal bowel sounds heard. Central nervous system: Alert and oriented.  Extremities: Symmetric 5 x 5 power.   Data Reviewed: I have personally reviewed following labs and imaging studies  CBC: Recent Labs  Lab 02/29/24 1407 03/01/24 0542  WBC 14.5* 9.8  HGB 13.8 12.2  HCT 41.7 37.3  MCV 90.8 91.4  PLT 172 161   Basic Metabolic Panel: Recent Labs  Lab 02/29/24 1407 03/01/24 0542  NA 136 139  K 3.6 3.4*  CL 100 106  CO2 25 22  GLUCOSE 130* 99  BUN 59* 45*  CREATININE 1.79* 1.20*  CALCIUM  9.3 8.7*   GFR: CrCl cannot be calculated (Unknown ideal weight.). Liver Function Tests: Recent Labs  Lab 02/29/24 1626 03/01/24 0542  AST 122* 104*  ALT 79* 70*  ALKPHOS 102 89  BILITOT 1.0 0.9  PROT 6.8 5.9*  ALBUMIN 2.9* 2.5*   No results for input(s): LIPASE, AMYLASE in the last 168 hours. No results for input(s): AMMONIA in the last 168 hours. Coagulation Profile: No results for input(s): INR, PROTIME in the last 168 hours. Cardiac Enzymes: Recent Labs  Lab 03/01/24 0542  CKTOTAL 333*   BNP (last 3 results) No results for input(s): PROBNP in the last 8760 hours. HbA1C: No results for input(s): HGBA1C in the last 72 hours. CBG: No results for input(s): GLUCAP in the last 168 hours. Lipid Profile: No results for input(s): CHOL, HDL, LDLCALC, TRIG, CHOLHDL, LDLDIRECT in the last 72 hours. Thyroid  Function Tests: Recent Labs    03/01/24 0542  TSH 9.674*   Anemia Panel: No results for input(s): VITAMINB12, FOLATE, FERRITIN, TIBC, IRON, RETICCTPCT in the last 72 hours. Sepsis Labs: No results for input(s): PROCALCITON, LATICACIDVEN in the last 168 hours.  No results found for this or any previous visit (from the past 240 hours).       Radiology Studies: US  Abdomen Limited RUQ (LIVER/GB) Result Date:  03/01/2024 CLINICAL DATA:  68 year old female with abnormal LFTs. Prior cholecystectomy. EXAM: ULTRASOUND ABDOMEN LIMITED RIGHT UPPER QUADRANT COMPARISON:  Chest CT 08/11/2022.  Ultrasound 09/17/2023. FINDINGS: Gallbladder: Surgically absent. Common bile duct: Diameter: 8-9 mm (previously 7-8 mm). No filling defect within the visible duct. Liver: No intrahepatic biliary ductal dilatation is identified. Echogenic liver redemonstrated (image 23). No discrete liver lesion. Portal vein is patent on color Doppler imaging with normal direction of blood flow towards the liver. Other: Negative visible right kidney.  No free fluid. IMPRESSION: 1. Cholecystectomy with mildly prominent CBD, 1 mm larger since January. No intrahepatic ductal dilatation to strongly suggest acute biliary obstruction. 2. Hepatic Steatosis. Electronically Signed   By: VEAR Hurst M.D.   On: 03/01/2024 05:29   CT HEAD WO CONTRAST ( ) Result Date: 02/29/2024 CLINICAL DATA:  Mental status change, confusion, dizziness. EXAM: CT HEAD WITHOUT CONTRAST TECHNIQUE: Contiguous axial images were obtained from the base of the skull through the vertex without intravenous contrast. RADIATION DOSE REDUCTION: This exam was performed according to the departmental dose-optimization program which includes automated exposure control, adjustment of the  mA and/or kV according to patient size and/or use of iterative reconstruction technique. COMPARISON:  MRI head 11/17/2021. FINDINGS: Brain: No acute intracranial hemorrhage. No CT evidence of acute infarct. Nonspecific hypoattenuation in the periventricular and subcortical white matter favored to reflect chronic microvascular ischemic changes. Remote infarcts in the bilateral parietal lobes. Generalized parenchymal volume loss. No edema, mass effect, or midline shift. The basilar cisterns are patent. Ventricles: The ventricles are normal. Vascular: Atherosclerotic calcifications of the carotid siphons and intracranial  vertebral arteries. No hyperdense vessel. Skull: No acute or aggressive finding. Orbits: Bilateral lens replacement. Sinuses: Mild mucosal thickening in the left maxillary sinus. Other: Mastoid air cells are clear. IMPRESSION: No acute intracranial abnormality. Extensive chronic microvascular ischemic changes and mild parenchymal volume loss. Remote infarcts in the bilateral parietal lobes. Electronically Signed   By: Donnice Mania M.D.   On: 02/29/2024 16:52   DG Chest 2 View Result Date: 02/29/2024 CLINICAL DATA:  Shortness of breath EXAM: CHEST - 2 VIEW COMPARISON:  CT 08/11/2022 FINDINGS: Heart and mediastinal contours are within normal limits. No focal opacities or effusions. No acute bony abnormality. IMPRESSION: No active cardiopulmonary disease. Electronically Signed   By: Franky Crease M.D.   On: 02/29/2024 15:01        Scheduled Meds:  aspirin   81 mg Oral Daily   atorvastatin   80 mg Oral q morning   enoxaparin  (LOVENOX ) injection  30 mg Subcutaneous Q24H   potassium chloride  40 mEq Oral Once   Continuous Infusions:  lactated ringers  100 mL/hr at 02/29/24 2036     LOS: 0 days    Time spent: 35 minutes    Terril Amaro A Marek Nghiem, MD [facility]   If 7PM-7AM, please contact night-coverage [email or web address]  03/01/2024, 7:48 AM

## 2024-03-01 NOTE — ED Notes (Signed)
 Patient taken to [facility]

## 2024-03-02 ENCOUNTER — Inpatient Hospital Stay (HOSPITAL_COMMUNITY)

## 2024-03-02 DIAGNOSIS — N179 Acute kidney failure, unspecified: Secondary | ICD-10-CM | POA: Diagnosis not present

## 2024-03-02 LAB — CBC
HCT: 35.3 % — ABNORMAL LOW (ref 36.0–46.0)
Hemoglobin: 11.4 g/dL — ABNORMAL LOW (ref 12.0–15.0)
MCH: 30.1 pg (ref 26.0–34.0)
MCHC: 32.3 g/dL (ref 30.0–36.0)
MCV: 93.1 fL (ref 80.0–100.0)
Platelets: 198 10*3/uL (ref 150–400)
RBC: 3.79 MIL/uL — ABNORMAL LOW (ref 3.87–5.11)
RDW: 14.7 % (ref 11.5–15.5)
WBC: 9.3 10*3/uL (ref 4.0–10.5)
nRBC: 0 % (ref 0.0–0.2)

## 2024-03-02 LAB — HEPARIN LEVEL (UNFRACTIONATED): Heparin Unfractionated: 0.46 [IU]/mL (ref 0.30–0.70)

## 2024-03-02 LAB — HEPATIC FUNCTION PANEL
ALT: 62 U/L — ABNORMAL HIGH (ref 0–44)
AST: 93 U/L — ABNORMAL HIGH (ref 15–41)
Albumin: 2.4 g/dL — ABNORMAL LOW (ref 3.5–5.0)
Alkaline Phosphatase: 86 U/L (ref 38–126)
Bilirubin, Direct: 0.1 mg/dL (ref 0.0–0.2)
Indirect Bilirubin: 0.7 mg/dL (ref 0.3–0.9)
Total Bilirubin: 0.8 mg/dL (ref 0.0–1.2)
Total Protein: 5.6 g/dL — ABNORMAL LOW (ref 6.5–8.1)

## 2024-03-02 LAB — BASIC METABOLIC PANEL WITH GFR
Anion gap: 9 (ref 5–15)
BUN: 30 mg/dL — ABNORMAL HIGH (ref 8–23)
CO2: 25 mmol/L (ref 22–32)
Calcium: 8.6 mg/dL — ABNORMAL LOW (ref 8.9–10.3)
Chloride: 105 mmol/L (ref 98–111)
Creatinine, Ser: 1.07 mg/dL — ABNORMAL HIGH (ref 0.44–1.00)
GFR, Estimated: 57 mL/min — ABNORMAL LOW (ref >60)
Glucose, Bld: 106 mg/dL — ABNORMAL HIGH (ref 70–99)
Potassium: 4 mmol/L (ref 3.5–5.1)
Sodium: 139 mmol/L (ref 135–145)

## 2024-03-02 LAB — T3, FREE: T3, Free: 2.1 pg/mL (ref 2.0–4.4)

## 2024-03-02 LAB — VITAMIN B12: Vitamin B-12: 496 pg/mL (ref 180–914)

## 2024-03-02 MED ORDER — ADULT MULTIVITAMIN W/MINERALS CH
1.0000 | ORAL_TABLET | Freq: Every day | ORAL | Status: DC
  Administered 2024-03-02 – 2024-03-07 (×6): 1 via ORAL
  Filled 2024-03-02 (×6): qty 1

## 2024-03-02 NOTE — Progress Notes (Signed)
 Physical Therapy Treatment Patient Details Name: Veronica Weber MRN: [id number] DOB: 04-05-1956 Today's Date: 03/02/2024   History of Present Illness Pt is a 68 y.o. female admitted 6/30 with acute renal failure. She fell at home and was unable to get up off the floor. PMH: HTN, CVA, tobacco use, L TKA (2023), obesity, OA    PT Comments  Pt supine in bed.  Limited due to fatigue and increased WOB.  SPo2 stable this session on RA.  Pt left on 1L  at 95% post session.  Pt continues to lack functional capacity and required numerous rest breaks during session.  Continue to recommend rehab in a post acute setting.    SATURATION QUALIFICATIONS: (This note is used to comply with regulatory documentation for home oxygen)  Patient Saturations on Room Air at Rest = 92%  Patient Saturations on Room Air while Ambulating = 88%  Patient Saturations on 1L Liters of oxygen while Ambulating = 95%  Please briefly explain why patient needs home oxygen: desat with activity.     If plan is discharge home, recommend the following: A little help with walking and/or transfers;A little help with bathing/dressing/bathroom;Assistance with cooking/housework;Assist for transportation;Help with stairs or ramp for entrance   Can travel by private vehicle     Yes  Equipment Recommendations  None recommended by PT    Recommendations for Other Services       Precautions / Restrictions Precautions Precautions: Fall Restrictions Weight Bearing Restrictions Per Provider Order: No     Mobility  Bed Mobility Overal bed mobility: Needs Assistance Bed Mobility: Supine to Sit, Sit to Supine     Supine to sit: Min assist, Used rails     General bed mobility comments: assist to elevate trunk, assist with BLE back to edge of bed.    Transfers Overall transfer level: Needs assistance Equipment used: Rolling walker (2 wheels) Transfers: Sit to/from Stand Sit to Stand: Min assist            General transfer comment: Cues for hand placement to and from seated surface and cues for backing entirely with RW before sitting. Pt with poor safety awareness and is very impulsive.    Ambulation/Gait Ambulation/Gait assistance: Contact guard assist Gait Distance (Feet): 10 Feet (x2 to and from bed.  3rd trial of 50 ft.) Assistive device: Rolling walker (2 wheels) Gait Pattern/deviations: Step-through pattern, Decreased stride length       General Gait Details: distance limited by lines and fatigue   Stairs             Wheelchair Mobility     Tilt Bed    Modified Rankin (Stroke Patients Only)       Balance Overall balance assessment: Needs assistance Sitting-balance support: No upper extremity supported, Feet unsupported Sitting balance-Leahy Scale: Good       Standing balance-Leahy Scale: Poor                              Communication Communication Communication: No apparent difficulties  Cognition Arousal: Alert Behavior During Therapy: WFL for tasks assessed/performed   PT - Cognitive impairments: No apparent impairments                         Following commands: Intact      Cueing Cueing Techniques: Verbal cues  Exercises      General Comments  Pertinent Vitals/Pain Pain Assessment Pain Assessment: Faces Faces Pain Scale: Hurts a little bit Pain Location: generalized Pain Descriptors / Indicators: Discomfort, Grimacing Pain Intervention(s): Monitored during session, Repositioned    Home Living                          Prior Function            PT Goals (current goals can now be found in the care plan section) Acute Rehab PT Goals Patient Stated Goal: get stronger and stop falling Potential to Achieve Goals: Good Progress towards PT goals: Progressing toward goals    Frequency    Min 2X/week      PT Plan      Co-evaluation              AM-PAC PT 6 Clicks Mobility    Outcome Measure  Help needed turning from your back to your side while in a flat bed without using bedrails?: A Little Help needed moving from lying on your back to sitting on the side of a flat bed without using bedrails?: A Little Help needed moving to and from a bed to a chair (including a wheelchair)?: A Little Help needed standing up from a chair using your arms (e.g., wheelchair or bedside chair)?: A Little Help needed to walk in hospital room?: A Little Help needed climbing 3-5 steps with a railing? : A Little 6 Click Score: 18    End of Session Equipment Utilized During Treatment: Gait belt Activity Tolerance: Patient tolerated treatment well Patient left: with call bell/phone within reach;in chair;with chair alarm set Nurse Communication: Mobility status PT Visit Diagnosis: Muscle weakness (generalized) (M62.81);Difficulty in walking, not elsewhere classified (R26.2);Repeated falls (R29.6)     Time: 8396-8353 PT Time Calculation (min) (ACUTE ONLY): 43 min  Charges:    $Gait Training: 8-22 mins $Therapeutic Activity: 23-37 mins PT General Charges $$ ACUTE PT VISIT: 1 Visit                     Veronica Weber , PTA Acute Rehabilitation Services Office [phone]    Veronica Weber 03/02/2024, 4:55 PM

## 2024-03-02 NOTE — Plan of Care (Signed)
  Problem: Education: Goal: Knowledge of General Education information will improve Description: Including pain rating scale, medication(s)/side effects and non-pharmacologic comfort measures Outcome: Progressing   Problem: Clinical Measurements: Goal: Cardiovascular complication will be avoided Outcome: Progressing   Problem: Activity: Goal: Risk for activity intolerance will decrease Outcome: Progressing   Problem: Nutrition: Goal: Adequate nutrition will be maintained Outcome: Progressing   Problem: Elimination: Goal: Will not experience complications related to urinary retention Outcome: Progressing   Problem: Pain Managment: Goal: General experience of comfort will improve and/or be controlled Outcome: Progressing   Problem: Safety: Goal: Ability to remain free from injury will improve Outcome: Progressing   Problem: Skin Integrity: Goal: Risk for impaired skin integrity will decrease Outcome: Progressing

## 2024-03-02 NOTE — TOC Progression Note (Addendum)
 Transition of Care [facility]) - Progression Note    Patient Details  Name: Veronica Weber MRN: [id number] Date of Birth: May 22, 1956  [facility] CM/SW Contact  Kailia Starry A [place], LCSW Phone Number: 03/02/2024, 11:09 AM  Clinical Narrative:     Update 1447 CSW spoke with pt's son, [name], [phone] to provide bed offers via secure email. [name]. He stated he would reach out to CSW with decision.   CSW met with pt at bedside to provide bed offers with Medicare.gov ratings. She stated she would discuss with her family and make a decision on a facility and let CSW know. CSW contact information provided.   TOC will continue to follow.         Expected Discharge Plan and Services                                               Social Determinants of Health (SDOH) Interventions SDOH Screenings   Food Insecurity: No Food Insecurity (02/12/2023)  Housing: Low Risk  (02/12/2023)  Transportation Needs: No Transportation Needs (02/12/2023)  Utilities: Not At Risk (02/12/2023)  Depression (PHQ2-9): High Risk (08/27/2023)  Financial Resource Strain: Low Risk  (02/12/2023)  Physical Activity: Sufficiently Active (02/12/2023)  Social Connections: Socially Isolated (02/12/2023)  Stress: No Stress Concern Present (02/12/2023)  Tobacco Use: Medium Risk (02/29/2024)    Readmission Risk Interventions    03/01/2024    3:22 PM  Readmission Risk Prevention Plan  Post Dischage Appt Complete  Medication Screening Complete  Transportation Screening Complete

## 2024-03-02 NOTE — Progress Notes (Addendum)
 Patient is taken for the xray.

## 2024-03-02 NOTE — Progress Notes (Signed)
 PHARMACY - ANTICOAGULATION CONSULT NOTE  Pharmacy Consult for Iv heparin  Indication: PE r/o  No Known Allergies  Patient Measurements: Height: 5' (152.4 cm) Weight: 109 kg (240 lb 4.8 oz) IBW/kg (Calculated) : 45.5 HEPARIN DW (KG): 72.5  Vital Signs: Temp: 97.6 F (36.4 C) (07/02 0754) BP: 103/59 (07/02 0754) Pulse Rate: 96 (07/02 0754)  Labs: Recent Labs    02/29/24 1407 03/01/24 0542 03/01/24 1750 03/02/24 0403  HGB 13.8 12.2  --  11.4*  HCT 41.7 37.3  --  35.3*  PLT 172 161  --  198  HEPARINUNFRC  --   --  0.29* 0.46  CREATININE 1.79* 1.20*  --  1.07*  CKTOTAL  --  333*  --   --     Estimated Creatinine Clearance: 56.3 mL/min (A) (by C-G formula based on SCr of 1.07 mg/dL (H)).   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 year old female admitted on 02/29/2024 with PE / DVT  Heparin level therapeutic this AM CBC stable  Goal of Therapy:  Heparin level 0.3-0.7 units/ml Monitor platelets by anticoagulation protocol: Yes   Plan:  Continue heparin at 1250 units / hr Daily heparin level, CBC, and monitoring for bleeding  Thank you. Olam Monte, PharmD

## 2024-03-02 NOTE — Progress Notes (Signed)
 Mobility Specialist: Progress Note   03/02/24 1455  Mobility  Activity Ambulated with assistance to bathroom  Level of Assistance Minimal assist, patient does 75% or more  Assistive Device Other (Comment) (HHA + furniture surf)  Distance Ambulated (ft) 30 ft  Activity Response Tolerated well  Mobility Referral Yes  Mobility visit 1 Mobility  Mobility Specialist Start Time (ACUTE ONLY) 1115  Mobility Specialist Stop Time (ACUTE ONLY) 1145  Mobility Specialist Time Calculation (min) (ACUTE ONLY) 30 min    Pre Mobility: SpO2 90-91% RA, 93-94% 1LO2 During Mobility: SpO2 85-88% 1LO2, 90-92% 2LO2 Post Mobility: SpO2 92% 1LO2  Pt received in bed, agreeable to mobility session. No RW in room, pt agreeable to try without AD. MinA for steadiness with HHA and furniture surfing. Pt desat on 1LO2 to SpO2 85-86% and was titrated to 2LO2. Pt recovered and maintained SpO2 >90% on 2LO2. Ambulated to the BR, pericare done ind. Left in the chair on 1LO2 with all needs met, call bell in reach.   Ileana Lute Mobility Specialist Please contact via SecureChat or Rehab office at [phone]

## 2024-03-02 NOTE — Plan of Care (Signed)

## 2024-03-02 NOTE — Progress Notes (Signed)
 PROGRESS NOTE    Veronica Weber  FMW:[id number] DOB: 12/22/1955 DOA: 02/29/2024 PCP: Kathrene Mardy HERO, PA-C   Brief Narrative: 68 year old with past medical history significant for stroke, hypertension, hyperlipidemia presents after having frequent fall and weakness.  4 days prior to admission she fell and was lying on the floor for 12 hours until her neighbor came and helped her.  She was brought to the ED ER by her son because of weakness.  She has been having watery diarrhea, resolved after she took Imodium.  Evaluation in the ED patient was noted to be hypotensive systolic blood pressure in the 80s, improved with IV fluids.  Presented with AKI, abnormal liver function test.  CT head no acute findings.  She was found to be hypoxic placed on 2 L of oxygen.   Assessment & Plan:   Principal Problem:   ARF (acute renal failure) (HCC) Active Problems:   Essential hypertension   Hyperlipidemia   History of stroke   Hypotension   Elevated LFTs   Hypoxia   Headache   1-Acute renal failure: -Presents with Cr 1.7, previous cr 0.7 -In the setting of dehydration, Diuretics (Hygroton , and Cozaar ) -Poor oral  intake -Resolved with IV fluids. NSL -improving  Acute Hypoxic Respiratory failure -In setting of PE -Elevated D dimer; -Oxygen stable on 2 L -VQ scan: Large right lung perfusion defect, highly suspicious for pulmonary embolism (pulmonary embolism present by PISAPED criteria). -Doppler: Findings consistent with acute deep vein thrombosis involving the left  popliteal vein, and left posterior tibial veins.  -Continue with Heparin Gtt. For 24 hours more, plan to transition to Eliquis tomorrow.  -Follow ECHO: Ef 65 %, McConnell's sign , RV systolic function normal.   Hypotension: In setting dehydration, hypovolemia, improved with fluids.  Hold diuretics, Cozaar .  Continue to monitor.  Monitor in setting of PE>   Frequent fall: -Needs PT. Needs Rehab.    Transaminases:  -in setting of Hypotension.  -Hold statins.  -Hepatitis panel negative -US : Cholecystectomy with mildly prominent CBD, 1 mm larger since January. No intrahepatic ductal dilatation to strongly suggest acute biliary obstruction. Hepatic Steatosis. -Trending down.   Hypothyroidism Elevated TSH She was not taking Synthroid .  Free T 3 and T 4. Normal.  Resume synthroid   Hypertension: -Hold diuretics, and Cozaar .   History of Stroke -Continue with aspirin .  Hold statins due to transaminases.   Diarrhea:  Has resolved.   History of Headaches.  Continue Gabapentin .   Hypokalemia; Replaced.   Back pain, acute on Chronic.  History of multilevel degenerative changes through lumbar spine.  Mild disc bulged.  -Will check X ray   Estimated body mass index is 46.93 kg/m as calculated from the following:   Height as of this encounter: 5' (1.524 m).   Weight as of this encounter: 109 kg.   DVT prophylaxis: heparin gtt  Code Status: Full code Family Communication: Son at bedside 7/01 Disposition Plan:  Status is: Observation The patient will require care spanning > 2 midnights and should be moved to inpatient because: PE, DVT fall    Consultants:  none  Procedures:  ECHO Doppler: Findings consistent with acute deep vein thrombosis involving the left  popliteal vein, and left posterior tibial veins.   Antimicrobials:    Subjective: She is breathing well , denies chest pain.  She report acute on chronic back pain. Feels leg stiff this morning, for been in bed.   Objective: Vitals:   03/01/24 1758 03/01/24 1955 03/02/24 0032 03/02/24  0444  BP: (!) 98/56 120/68 127/63 104/62  Pulse:  (!) 104 95 98  Resp:   17 16  Temp:  98.2 F (36.8 C) (!) 97.5 F (36.4 C) 97.9 F (36.6 C)  TempSrc:  Oral    SpO2: 93% 97% 99% 98%  Weight:      Height:        Intake/Output Summary (Last 24 hours) at 03/02/2024 9341 Last data filed at 03/02/2024 0404 Gross per  24 hour  Intake 2061.19 ml  Output --  Net 2061.19 ml   Filed Weights   03/01/24 1000  Weight: 109 kg    Examination:  General exam: NAD Respiratory system: CTA Cardiovascular system: S 1, S 2 RRR Gastrointestinal system: BS present, soft, nt Central nervous system: Alert Extremities: Symmetric 5 x 5 power.   Data Reviewed: I have personally reviewed following labs and imaging studies  CBC: Recent Labs  Lab 02/29/24 1407 03/01/24 0542 03/02/24 0403  WBC 14.5* 9.8 9.3  HGB 13.8 12.2 11.4*  HCT 41.7 37.3 35.3*  MCV 90.8 91.4 93.1  PLT 172 161 198   Basic Metabolic Panel: Recent Labs  Lab 02/29/24 1407 03/01/24 0542 03/02/24 0403  NA 136 139 139  K 3.6 3.4* 4.0  CL 100 106 105  CO2 25 22 25   GLUCOSE 130* 99 106*  BUN 59* 45* 30*  CREATININE 1.79* 1.20* 1.07*  CALCIUM  9.3 8.7* 8.6*   GFR: Estimated Creatinine Clearance: 56.3 mL/min (A) (by C-G formula based on SCr of 1.07 mg/dL (H)). Liver Function Tests: Recent Labs  Lab 02/29/24 1626 03/01/24 0542  AST 122* 104*  ALT 79* 70*  ALKPHOS 102 89  BILITOT 1.0 0.9  PROT 6.8 5.9*  ALBUMIN 2.9* 2.5*   No results for input(s): LIPASE, AMYLASE in the last 168 hours. No results for input(s): AMMONIA in the last 168 hours. Coagulation Profile: No results for input(s): INR, PROTIME in the last 168 hours. Cardiac Enzymes: Recent Labs  Lab 03/01/24 0542  CKTOTAL 333*   BNP (last 3 results) No results for input(s): PROBNP in the last 8760 hours. HbA1C: No results for input(s): HGBA1C in the last 72 hours. CBG: Recent Labs  Lab 03/01/24 2029  GLUCAP 132*   Lipid Profile: No results for input(s): CHOL, HDL, LDLCALC, TRIG, CHOLHDL, LDLDIRECT in the last 72 hours. Thyroid  Function Tests: Recent Labs    03/01/24 0542 03/01/24 1427 03/01/24 1446  TSH 9.674*  --   --   FREET4  --   --  1.09  T3FREE  --  2.1  --    Anemia Panel: Recent Labs    03/02/24 0403  VITAMINB12  496   Sepsis Labs: No results for input(s): PROCALCITON, LATICACIDVEN in the last 168 hours.  No results found for this or any previous visit (from the past 240 hours).       Radiology Studies: VAS US  LOWER EXTREMITY VENOUS (DVT) Result Date: 03/01/2024  Lower Venous DVT Study Patient Name:  Veronica Weber [facility]  Date of Exam:   03/01/2024 Medical Rec #: [id number]                Accession #:    [id number] Date of Birth: Jun 19, 1956                Patient Gender: F Patient Age:   79 years Exam Location:  [facility] [place] Procedure:      VAS US  LOWER EXTREMITY VENOUS (DVT) Referring Phys: OWEN Anginette Espejo --------------------------------------------------------------------------------  Indications: Edema, and SOB.  Limitations: Body habitus and poor ultrasound/tissue interface. Comparison Study: No previous exams Performing Technologist: Jody Hill RVT, RDMS  Examination Guidelines: A complete evaluation includes B-mode imaging, spectral Doppler, color Doppler, and power Doppler as needed of all accessible portions of each vessel. Bilateral testing is considered an integral part of a complete examination. Limited examinations for reoccurring indications may be performed as noted. The reflux portion of the exam is performed with the patient in reverse Trendelenburg.  +---------+---------------+---------+-----------+----------+-------------------+ RIGHT    CompressibilityPhasicitySpontaneityPropertiesThrombus Aging      +---------+---------------+---------+-----------+----------+-------------------+ CFV      Full           Yes      Yes                                      +---------+---------------+---------+-----------+----------+-------------------+ SFJ      Full                                                             +---------+---------------+---------+-----------+----------+-------------------+ FV Prox  Full           Yes      Yes                                       +---------+---------------+---------+-----------+----------+-------------------+ FV Mid   Full           Yes      Yes                                      +---------+---------------+---------+-----------+----------+-------------------+ FV DistalFull           Yes      Yes                                      +---------+---------------+---------+-----------+----------+-------------------+ PFV      Full                                                             +---------+---------------+---------+-----------+----------+-------------------+ POP      Full           Yes      Yes                                      +---------+---------------+---------+-----------+----------+-------------------+ PTV      Full                                         Not well visualized +---------+---------------+---------+-----------+----------+-------------------+ PERO     Full  Not well visualized +---------+---------------+---------+-----------+----------+-------------------+   +--------+---------------+---------+-----------+----------+--------------------+ LEFT    CompressibilityPhasicitySpontaneityPropertiesThrombus Aging       +--------+---------------+---------+-----------+----------+--------------------+ CFV     Full           Yes      Yes                                       +--------+---------------+---------+-----------+----------+--------------------+ SFJ     Full                                                              +--------+---------------+---------+-----------+----------+--------------------+ FV Prox Full           Yes      Yes                                       +--------+---------------+---------+-----------+----------+--------------------+ FV Mid  Full           Yes      Yes                                       +--------+---------------+---------+-----------+----------+--------------------+ FV                      Yes      Yes                  patent by            Distal                                               color/doppler        +--------+---------------+---------+-----------+----------+--------------------+ PFV     Full                                                              +--------+---------------+---------+-----------+----------+--------------------+ POP     None           No       No                   Acute                +--------+---------------+---------+-----------+----------+--------------------+ PTV     None           No       No                   Acute                +--------+---------------+---------+-----------+----------+--------------------+ PERO    Full                                                              +--------+---------------+---------+-----------+----------+--------------------+  Summary: BILATERAL: -No evidence of popliteal cyst, bilaterally. RIGHT: - There is no evidence of deep vein thrombosis in the lower extremity.  LEFT: - Findings consistent with acute deep vein thrombosis involving the left popliteal vein, and left posterior tibial veins.   *See table(s) above for measurements and observations. Electronically signed by Debby Robertson on 03/01/2024 at 9:18:51 PM.    Final    ECHOCARDIOGRAM COMPLETE Result Date: 03/01/2024    ECHOCARDIOGRAM REPORT   Patient Name:   BREYON BLASS Date of Exam: 03/01/2024 Medical Rec #:  [id number]               Height:       60.0 in Accession #:    [id number]              Weight:       241.0 lb Date of Birth:  1956/05/02               BSA:          2.021 m Patient Age:    68 years                BP:           118/82 mmHg Patient Gender: F                       HR:           100 bpm. Exam Location:  Inpatient Procedure: 2D Echo, Cardiac Doppler, Color Doppler and Intracardiac            Opacification Agent (Both Spectral and Color Flow Doppler were            utilized during  procedure). Indications:    Syncope  History:        Patient has prior history of Echocardiogram examinations, most                 recent 11/17/2021.  Sonographer:    Therisa Crouch Referring Phys: [phone] ARSHAD N KAKRAKANDY IMPRESSIONS  1. Left ventricular ejection fraction, by estimation, is 60 to 65%. The left ventricle has normal function. The left ventricle has no regional wall motion abnormalities. Left ventricular diastolic parameters are consistent with Grade I diastolic dysfunction (impaired relaxation). There is the interventricular septum is flattened in systole, consistent with right ventricular pressure overload.  2. Although overall right ventricular function appears normal, there is relative hypocontractility of the free wall, compared to the apex (McConnell's sign). Right ventricular systolic function is normal. The right ventricular size is mildly enlarged. There is moderately elevated pulmonary artery systolic pressure. The estimated right ventricular systolic pressure is 49.1 mmHg.  3. A small pericardial effusion is present. The pericardial effusion is circumferential.  4. The mitral valve is normal in structure. No evidence of mitral valve regurgitation. No evidence of mitral stenosis.  5. Tricuspid valve regurgitation is mild to moderate.  6. The aortic valve is tricuspid. There is mild calcification of the aortic valve. Aortic valve regurgitation is not visualized. Aortic valve sclerosis is present, with no evidence of aortic valve stenosis.  7. The inferior vena cava is dilated in size with <50% respiratory variability, suggesting right atrial pressure of 15 mmHg. Conclusion(s)/Recommendation(s): Findings consistent with Cor Pulmonale. (e.g. pulmonary embolism) FINDINGS  Left Ventricle: Left ventricular ejection fraction, by estimation, is 60 to 65%. The left ventricle has normal function. The left ventricle has no regional wall motion abnormalities. The left ventricular internal cavity  size was  normal in size. There is  no left ventricular hypertrophy. The interventricular septum is flattened in systole, consistent with right ventricular pressure overload. Left ventricular diastolic parameters are consistent with Grade I diastolic dysfunction (impaired relaxation). Normal left ventricular filling pressure. Right Ventricle: Although overall right ventricular function appears normal, there is relative hypocontractility of the free wall, compared to the apex (McConnell's sign). The right ventricular size is mildly enlarged. No increase in right ventricular wall thickness. Right ventricular systolic function is normal. There is moderately elevated pulmonary artery systolic pressure. The tricuspid regurgitant velocity is 2.92 m/s, and with an assumed right atrial pressure of 15 mmHg, the estimated right ventricular systolic pressure is 49.1 mmHg. Left Atrium: Left atrial size was normal in size. Right Atrium: Right atrial size was normal in size. Pericardium: A small pericardial effusion is present. The pericardial effusion is circumferential. Mitral Valve: The mitral valve is normal in structure. Mild mitral annular calcification. No evidence of mitral valve regurgitation. No evidence of mitral valve stenosis. Tricuspid Valve: The tricuspid valve is normal in structure. Tricuspid valve regurgitation is mild to moderate. Aortic Valve: The aortic valve is tricuspid. There is mild calcification of the aortic valve. Aortic valve regurgitation is not visualized. Aortic valve sclerosis is present, with no evidence of aortic valve stenosis. Pulmonic Valve: The pulmonic valve was not well visualized. Pulmonic valve regurgitation is trivial. No evidence of pulmonic stenosis. Aorta: The aortic root and ascending aorta are structurally normal, with no evidence of dilitation. Venous: The inferior vena cava is dilated in size with less than 50% respiratory variability, suggesting right atrial pressure of 15 mmHg.  IAS/Shunts: The interatrial septum was not well visualized.  LEFT VENTRICLE PLAX 2D LVIDd:         3.70 cm   Diastology LVIDs:         2.50 cm   LV e' medial:    9.36 cm/s LV PW:         1.20 cm   LV E/e' medial:  6.0 LV IVS:        1.10 cm   LV e' lateral:   9.79 cm/s LVOT diam:     2.00 cm   LV E/e' lateral: 5.8 LVOT Area:     3.14 cm  LEFT ATRIUM             Index LA diam:        2.80 cm 1.39 cm/m LA Vol (A2C):   32.4 ml 16.04 ml/m LA Vol (A4C):   41.0 ml 20.29 ml/m LA Biplane Vol: 36.6 ml 18.11 ml/m   AORTA Ao Root diam: 3.20 cm Ao Asc diam:  3.30 cm MITRAL VALVE               TRICUSPID VALVE MV Area (PHT): 4.06 cm    TR Peak grad:   34.1 mmHg MV Decel Time: 187 msec    TR Vmax:        292.00 cm/s MV E velocity: 56.30 cm/s MV A velocity: 91.80 cm/s  SHUNTS MV E/A ratio:  0.61        Systemic Diam: 2.00 cm Jerel Croitoru MD Electronically signed by Jerel Balding MD Signature Date/Time: 03/01/2024/2:47:50 PM    Final    NM Pulmonary Perfusion Result Date: 03/01/2024 CLINICAL DATA:  Hypoxemia.  Concern for pulmonary embolism. EXAM: NUCLEAR MEDICINE PERFUSION LUNG SCAN TECHNIQUE: Perfusion images were obtained in multiple projections after intravenous injection of radiopharmaceutical. No ventilation imaging performed. RADIOPHARMACEUTICALS:  4.4  mCi Tc-46m MAA IV COMPARISON:  Chest radiographs 02/29/2024. Noncontrast chest CT 08/11/2024. FINDINGS: There is a large peripheral wedge shaped perfusion defect in the right lung without corresponding abnormality on the recent chest radiographs. No suspicious perfusion defects within the left lung. IMPRESSION: Large right lung perfusion defect, highly suspicious for pulmonary embolism (pulmonary embolism present by PISAPED criteria). Critical Value/emergent results were called by telephone at the time of interpretation on 03/01/2024 at 2:07 pm to provider Nattalie Santiesteban , who verbally acknowledged these results. In discussion with Dr. Madelyne, the patient also had  recent lower extremity venous Doppler ultrasound positive for DVT. Electronically Signed   By: Elsie Perone M.D.   On: 03/01/2024 14:09   US  Abdomen Limited RUQ (LIVER/GB) Result Date: 03/01/2024 CLINICAL DATA:  68 year old female with abnormal LFTs. Prior cholecystectomy. EXAM: ULTRASOUND ABDOMEN LIMITED RIGHT UPPER QUADRANT COMPARISON:  Chest CT 08/11/2022.  Ultrasound 09/17/2023. FINDINGS: Gallbladder: Surgically absent. Common bile duct: Diameter: 8-9 mm (previously 7-8 mm). No filling defect within the visible duct. Liver: No intrahepatic biliary ductal dilatation is identified. Echogenic liver redemonstrated (image 23). No discrete liver lesion. Portal vein is patent on color Doppler imaging with normal direction of blood flow towards the liver. Other: Negative visible right kidney.  No free fluid. IMPRESSION: 1. Cholecystectomy with mildly prominent CBD, 1 mm larger since January. No intrahepatic ductal dilatation to strongly suggest acute biliary obstruction. 2. Hepatic Steatosis. Electronically Signed   By: VEAR Hurst M.D.   On: 03/01/2024 05:29   CT HEAD WO CONTRAST ( ) Result Date: 02/29/2024 CLINICAL DATA:  Mental status change, confusion, dizziness. EXAM: CT HEAD WITHOUT CONTRAST TECHNIQUE: Contiguous axial images were obtained from the base of the skull through the vertex without intravenous contrast. RADIATION DOSE REDUCTION: This exam was performed according to the departmental dose-optimization program which includes automated exposure control, adjustment of the mA and/or kV according to patient size and/or use of iterative reconstruction technique. COMPARISON:  MRI head 11/17/2021. FINDINGS: Brain: No acute intracranial hemorrhage. No CT evidence of acute infarct. Nonspecific hypoattenuation in the periventricular and subcortical white matter favored to reflect chronic microvascular ischemic changes. Remote infarcts in the bilateral parietal lobes. Generalized parenchymal volume loss. No  edema, mass effect, or midline shift. The basilar cisterns are patent. Ventricles: The ventricles are normal. Vascular: Atherosclerotic calcifications of the carotid siphons and intracranial vertebral arteries. No hyperdense vessel. Skull: No acute or aggressive finding. Orbits: Bilateral lens replacement. Sinuses: Mild mucosal thickening in the left maxillary sinus. Other: Mastoid air cells are clear. IMPRESSION: No acute intracranial abnormality. Extensive chronic microvascular ischemic changes and mild parenchymal volume loss. Remote infarcts in the bilateral parietal lobes. Electronically Signed   By: Donnice Mania M.D.   On: 02/29/2024 16:52   DG Chest 2 View Result Date: 02/29/2024 CLINICAL DATA:  Shortness of breath EXAM: CHEST - 2 VIEW COMPARISON:  CT 08/11/2022 FINDINGS: Heart and mediastinal contours are within normal limits. No focal opacities or effusions. No acute bony abnormality. IMPRESSION: No active cardiopulmonary disease. Electronically Signed   By: Franky Crease M.D.   On: 02/29/2024 15:01        Scheduled Meds:  aspirin   81 mg Oral Daily   gabapentin   600 mg Oral QHS   levothyroxine   25 mcg Oral Q0600   multivitamin with minerals  1 tablet Oral Daily   Continuous Infusions:  heparin 1,250 Units/hr (03/02/24 0407)     LOS: 1 day    Time spent: 35 minutes    Aryaan Persichetti  DELENA Lore, MD [facility]   If 7PM-7AM, please contact night-coverage [email or web address]  03/02/2024, 6:58 AM

## 2024-03-03 DIAGNOSIS — N179 Acute kidney failure, unspecified: Secondary | ICD-10-CM | POA: Diagnosis not present

## 2024-03-03 DIAGNOSIS — I82413 Acute embolism and thrombosis of femoral vein, bilateral: Secondary | ICD-10-CM | POA: Diagnosis not present

## 2024-03-03 DIAGNOSIS — I10 Essential (primary) hypertension: Secondary | ICD-10-CM

## 2024-03-03 LAB — COMPREHENSIVE METABOLIC PANEL WITH GFR
ALT: 60 U/L — ABNORMAL HIGH (ref 0–44)
AST: 87 U/L — ABNORMAL HIGH (ref 15–41)
Albumin: 2.2 g/dL — ABNORMAL LOW (ref 3.5–5.0)
Alkaline Phosphatase: 87 U/L (ref 38–126)
Anion gap: 6 (ref 5–15)
BUN: 27 mg/dL — ABNORMAL HIGH (ref 8–23)
CO2: 26 mmol/L (ref 22–32)
Calcium: 9 mg/dL (ref 8.9–10.3)
Chloride: 106 mmol/L (ref 98–111)
Creatinine, Ser: 1.17 mg/dL — ABNORMAL HIGH (ref 0.44–1.00)
GFR, Estimated: 51 mL/min — ABNORMAL LOW (ref >60)
Glucose, Bld: 108 mg/dL — ABNORMAL HIGH (ref 70–99)
Potassium: 4.3 mmol/L (ref 3.5–5.1)
Sodium: 138 mmol/L (ref 135–145)
Total Bilirubin: 0.5 mg/dL (ref 0.0–1.2)
Total Protein: 5.4 g/dL — ABNORMAL LOW (ref 6.5–8.1)

## 2024-03-03 LAB — CBC
HCT: 34.1 % — ABNORMAL LOW (ref 36.0–46.0)
Hemoglobin: 10.8 g/dL — ABNORMAL LOW (ref 12.0–15.0)
MCH: 29.4 pg (ref 26.0–34.0)
MCHC: 31.7 g/dL (ref 30.0–36.0)
MCV: 92.9 fL (ref 80.0–100.0)
Platelets: 232 10*3/uL (ref 150–400)
RBC: 3.67 MIL/uL — ABNORMAL LOW (ref 3.87–5.11)
RDW: 15 % (ref 11.5–15.5)
WBC: 11.3 10*3/uL — ABNORMAL HIGH (ref 4.0–10.5)
nRBC: 0.3 % — ABNORMAL HIGH (ref 0.0–0.2)

## 2024-03-03 LAB — HEPARIN LEVEL (UNFRACTIONATED): Heparin Unfractionated: 0.49 [IU]/mL (ref 0.30–0.70)

## 2024-03-03 NOTE — Progress Notes (Signed)
 [facility] PROGRESS NOTE    Progress Note  GIDGET QUIZHPI  FMW:[id number] DOB: [birth year] DOA: 02/29/2024 PCP: Kathrene Mardy HERO, PA-C     Brief Narrative:   RULA KENISTON is an 68 y.o. female past medical history of CVA, essential hypertension hyperlipidemia comes in for frequent falls and weakness that started 4 days prior to admission she fell on the floor could not get up, in the ED was noted to be hypotensive found to be in acute kidney injury with abnormal LFTs CT of the head showed no acute findings   Assessment/Plan:   Acute kidney injury: With a previous creatinine of less than 1 on admission 1.7.  Likely prerenal azotemia Diuretics and ARB were held. She was started on IV fluids her creatinine this morning is 1.1. Continue IV fluids for an additional 24 hours.  Acute respiratory failure with hypoxia due to acute PE/DVT: Lower extremity Doppler showed a left DVT and VQ scan was positive for large perfusion right defect likely PE. Continue IV heparin drip for an additional 24 hours can change to oral Eliquis tomorrow. 2D echo showed an EF of 65% right systolic function is unremarkable. Out of bed to chair try to wean to room air.  Hypotension/essential hypertension: Multifactorial likely due to dehydration hypovolemia and probably PE. Antihypertensive medications were held with started on IV fluids. Blood pressure is nicely improving.  Frequent falls: PT OT evaluated the patient she will need skilled nursing facility.  Elevated LFTs: Statins were held in the setting of hypotension. Hepatitis panel was negative. LFTs are trending down abdominal ultrasound showed mildly prominent CBD status post cholecystectomy.  Hypothyroidism: She was being noncompliant with her Synthroid . Resumed Synthroid  recheck TSH and free T4 as an outpatient in 4 to 6 weeks.  History of CVA: Holding statins continue aspirin .  Diarrhea: Now resolved.  History  of headaches: Continue gabapentin .  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  Hypokalemia: Replete try to keep greater than 4.    DVT prophylaxis: Heparin Family Communication:none Status is: Inpatient Remains inpatient appropriate because: Will need skilled nursing facility    Code Status:     Code Status Orders  (From admission, onward)           Start     Ordered   02/29/24 2152  Full code  Continuous       Question:  By:  Answer:  Consent: discussion documented in EHR   02/29/24 2153           Code Status History     Date Active Date Inactive Code Status Order ID Comments User Context   05/22/2022 1215 05/25/2022 0124 Full Code [id number]  Addie Cordella Hamilton, MD Inpatient   11/19/2021 1352 11/23/2021 1412 Full Code [id number]  Pegge Toribio JINNY, PA-C Inpatient   11/16/2021 1950 11/19/2021 1347 Full Code [id number]  Tobie Jorie SAUNDERS, MD ED         IV Access:   Peripheral IV   Procedures and diagnostic studies:   DG Lumbar Spine 2-3 Views Result Date: 03/02/2024 CLINICAL DATA:  Back pain. EXAM: LUMBAR SPINE - 2-3 VIEW COMPARISON:  Abdominal radiograph dated 10/07/2021. FINDINGS: Five lumbar type vertebra. There is no acute fracture or subluxation of the lumbar spine. Mild chronic compression fracture of superior endplate of L2. Multilevel degenerative changes and spurring. Lower lumbar facet arthropathy. Atherosclerotic calcification of the abdominal aorta. There is  a 4.2 cm abdominal aortic aneurysm. Right upper quadrant cholecystectomy clips. IMPRESSION: 1. No acute findings. 2. Multilevel degenerative changes. 3. A 4.2 cm abdominal aortic aneurysm. Recommend follow-up CT or MR as appropriate in 12 months and referral to or continued care with vascular specialist. (Ref.: J Vasc Surg. 2018; 67:2-77 and J Am Coll Radiol 2013;10(10):789-794.) Electronically Signed   By:  Vanetta Chou M.D.   On: 03/02/2024 16:37   VAS US  LOWER EXTREMITY VENOUS (DVT) Result Date: 03/01/2024  Lower Venous DVT Study Patient Name:  ELESHA THEDFORD [facility]  Date of Exam:   03/01/2024 Medical Rec #: [id number]                Accession #:    [id number] Date of Birth: Aug 22, 1956                Patient Gender: F Patient Age:   72 years Exam Location:  [facility] Procedure:      VAS US  LOWER EXTREMITY VENOUS (DVT) Referring Phys: OWEN REGALADO --------------------------------------------------------------------------------  Indications: Edema, and SOB.  Limitations: Body habitus and poor ultrasound/tissue interface. Comparison Study: No previous exams Performing Technologist: Jody Hill RVT, RDMS  Examination Guidelines: A complete evaluation includes B-mode imaging, spectral Doppler, color Doppler, and power Doppler as needed of all accessible portions of each vessel. Bilateral testing is considered an integral part of a complete examination. Limited examinations for reoccurring indications may be performed as noted. The reflux portion of the exam is performed with the patient in reverse Trendelenburg.  +---------+---------------+---------+-----------+----------+-------------------+ RIGHT    CompressibilityPhasicitySpontaneityPropertiesThrombus Aging      +---------+---------------+---------+-----------+----------+-------------------+ CFV      Full           Yes      Yes                                      +---------+---------------+---------+-----------+----------+-------------------+ SFJ      Full                                                             +---------+---------------+---------+-----------+----------+-------------------+ FV Prox  Full           Yes      Yes                                      +---------+---------------+---------+-----------+----------+-------------------+ FV Mid   Full           Yes      Yes                                       +---------+---------------+---------+-----------+----------+-------------------+ FV DistalFull           Yes      Yes                                      +---------+---------------+---------+-----------+----------+-------------------+ PFV      Full                                                             +---------+---------------+---------+-----------+----------+-------------------+  POP      Full           Yes      Yes                                      +---------+---------------+---------+-----------+----------+-------------------+ PTV      Full                                         Not well visualized +---------+---------------+---------+-----------+----------+-------------------+ PERO     Full                                         Not well visualized +---------+---------------+---------+-----------+----------+-------------------+   +--------+---------------+---------+-----------+----------+--------------------+ LEFT    CompressibilityPhasicitySpontaneityPropertiesThrombus Aging       +--------+---------------+---------+-----------+----------+--------------------+ CFV     Full           Yes      Yes                                       +--------+---------------+---------+-----------+----------+--------------------+ SFJ     Full                                                              +--------+---------------+---------+-----------+----------+--------------------+ FV Prox Full           Yes      Yes                                       +--------+---------------+---------+-----------+----------+--------------------+ FV Mid  Full           Yes      Yes                                       +--------+---------------+---------+-----------+----------+--------------------+ FV                     Yes      Yes                  patent by            Distal                                               color/doppler         +--------+---------------+---------+-----------+----------+--------------------+ PFV     Full                                                              +--------+---------------+---------+-----------+----------+--------------------+  POP     None           No       No                   Acute                +--------+---------------+---------+-----------+----------+--------------------+ PTV     None           No       No                   Acute                +--------+---------------+---------+-----------+----------+--------------------+ PERO    Full                                                              +--------+---------------+---------+-----------+----------+--------------------+     Summary: BILATERAL: -No evidence of popliteal cyst, bilaterally. RIGHT: - There is no evidence of deep vein thrombosis in the lower extremity.  LEFT: - Findings consistent with acute deep vein thrombosis involving the left popliteal vein, and left posterior tibial veins.   *See table(s) above for measurements and observations. Electronically signed by Debby Robertson on 03/01/2024 at 9:18:51 PM.    Final    ECHOCARDIOGRAM COMPLETE Result Date: 03/01/2024    ECHOCARDIOGRAM REPORT   Patient Name:   LETICA GIAIMO Date of Exam: 03/01/2024 Medical Rec #:  [id number]               Height:       60.0 in Accession #:    [id number]              Weight:       241.0 lb Date of Birth:  Sep 16, 1955               BSA:          2.021 m Patient Age:    68 years                BP:           118/82 mmHg Patient Gender: F                       HR:           100 bpm. Exam Location:  Inpatient Procedure: 2D Echo, Cardiac Doppler, Color Doppler and Intracardiac            Opacification Agent (Both Spectral and Color Flow Doppler were            utilized during procedure). Indications:    Syncope  History:        Patient has prior history of Echocardiogram examinations, most                 recent 11/17/2021.   Sonographer:    Therisa Crouch Referring Phys: [phone] ARSHAD N KAKRAKANDY IMPRESSIONS  1. Left ventricular ejection fraction, by estimation, is 60 to 65%. The left ventricle has normal function. The left ventricle has no regional wall motion abnormalities. Left ventricular diastolic parameters are consistent with Grade I diastolic dysfunction (impaired relaxation). There is the interventricular septum is flattened in systole, consistent with right ventricular pressure overload.  2. Although  overall right ventricular function appears normal, there is relative hypocontractility of the free wall, compared to the apex (McConnell's sign). Right ventricular systolic function is normal. The right ventricular size is mildly enlarged. There is moderately elevated pulmonary artery systolic pressure. The estimated right ventricular systolic pressure is 49.1 mmHg.  3. A small pericardial effusion is present. The pericardial effusion is circumferential.  4. The mitral valve is normal in structure. No evidence of mitral valve regurgitation. No evidence of mitral stenosis.  5. Tricuspid valve regurgitation is mild to moderate.  6. The aortic valve is tricuspid. There is mild calcification of the aortic valve. Aortic valve regurgitation is not visualized. Aortic valve sclerosis is present, with no evidence of aortic valve stenosis.  7. The inferior vena cava is dilated in size with <50% respiratory variability, suggesting right atrial pressure of 15 mmHg. Conclusion(s)/Recommendation(s): Findings consistent with Cor Pulmonale. (e.g. pulmonary embolism) FINDINGS  Left Ventricle: Left ventricular ejection fraction, by estimation, is 60 to 65%. The left ventricle has normal function. The left ventricle has no regional wall motion abnormalities. The left ventricular internal cavity size was normal in size. There is  no left ventricular hypertrophy. The interventricular septum is flattened in systole, consistent with right ventricular  pressure overload. Left ventricular diastolic parameters are consistent with Grade I diastolic dysfunction (impaired relaxation). Normal left ventricular filling pressure. Right Ventricle: Although overall right ventricular function appears normal, there is relative hypocontractility of the free wall, compared to the apex (McConnell's sign). The right ventricular size is mildly enlarged. No increase in right ventricular wall thickness. Right ventricular systolic function is normal. There is moderately elevated pulmonary artery systolic pressure. The tricuspid regurgitant velocity is 2.92 m/s, and with an assumed right atrial pressure of 15 mmHg, the estimated right ventricular systolic pressure is 49.1 mmHg. Left Atrium: Left atrial size was normal in size. Right Atrium: Right atrial size was normal in size. Pericardium: A small pericardial effusion is present. The pericardial effusion is circumferential. Mitral Valve: The mitral valve is normal in structure. Mild mitral annular calcification. No evidence of mitral valve regurgitation. No evidence of mitral valve stenosis. Tricuspid Valve: The tricuspid valve is normal in structure. Tricuspid valve regurgitation is mild to moderate. Aortic Valve: The aortic valve is tricuspid. There is mild calcification of the aortic valve. Aortic valve regurgitation is not visualized. Aortic valve sclerosis is present, with no evidence of aortic valve stenosis. Pulmonic Valve: The pulmonic valve was not well visualized. Pulmonic valve regurgitation is trivial. No evidence of pulmonic stenosis. Aorta: The aortic root and ascending aorta are structurally normal, with no evidence of dilitation. Venous: The inferior vena cava is dilated in size with less than 50% respiratory variability, suggesting right atrial pressure of 15 mmHg. IAS/Shunts: The interatrial septum was not well visualized.  LEFT VENTRICLE PLAX 2D LVIDd:         3.70 cm   Diastology LVIDs:         2.50 cm   LV e'  medial:    9.36 cm/s LV PW:         1.20 cm   LV E/e' medial:  6.0 LV IVS:        1.10 cm   LV e' lateral:   9.79 cm/s LVOT diam:     2.00 cm   LV E/e' lateral: 5.8 LVOT Area:     3.14 cm  LEFT ATRIUM             Index LA diam:  2.80 cm 1.39 cm/m LA Vol (A2C):   32.4 ml 16.04 ml/m LA Vol (A4C):   41.0 ml 20.29 ml/m LA Biplane Vol: 36.6 ml 18.11 ml/m   AORTA Ao Root diam: 3.20 cm Ao Asc diam:  3.30 cm MITRAL VALVE               TRICUSPID VALVE MV Area (PHT): 4.06 cm    TR Peak grad:   34.1 mmHg MV Decel Time: 187 msec    TR Vmax:        292.00 cm/s MV E velocity: 56.30 cm/s MV A velocity: 91.80 cm/s  SHUNTS MV E/A ratio:  0.61        Systemic Diam: 2.00 cm Jerel Croitoru MD Electronically signed by Jerel Balding MD Signature Date/Time: 03/01/2024/2:47:50 PM    Final    NM Pulmonary Perfusion Result Date: 03/01/2024 CLINICAL DATA:  Hypoxemia.  Concern for pulmonary embolism. EXAM: NUCLEAR MEDICINE PERFUSION LUNG SCAN TECHNIQUE: Perfusion images were obtained in multiple projections after intravenous injection of radiopharmaceutical. No ventilation imaging performed. RADIOPHARMACEUTICALS:  4.4 mCi Tc-49m MAA IV COMPARISON:  Chest radiographs 02/29/2024. Noncontrast chest CT 08/11/2024. FINDINGS: There is a large peripheral wedge shaped perfusion defect in the right lung without corresponding abnormality on the recent chest radiographs. No suspicious perfusion defects within the left lung. IMPRESSION: Large right lung perfusion defect, highly suspicious for pulmonary embolism (pulmonary embolism present by PISAPED criteria). Critical Value/emergent results were called by telephone at the time of interpretation on 03/01/2024 at 2:07 pm to provider BELKYS REGALADO , who verbally acknowledged these results. In discussion with Dr. Madelyne, the patient also had recent lower extremity venous Doppler ultrasound positive for DVT. Electronically Signed   By: Elsie Perone M.D.   On: 03/01/2024 14:09     Medical  Consultants:   None.   Subjective:    LASSIE DEMOREST she relates she feels about the same breathing she does relates is better.  Objective:    Vitals:   03/02/24 1642 03/02/24 1853 03/02/24 2109 03/03/24 0433  BP: 98/79 (!) 87/67 (!) 137/116 103/69  Pulse:   100 97  Resp: (!) 24 17 19 20   Temp: 98 F (36.7 C) 98.1 F (36.7 C) 97.7 F (36.5 C) 98.3 F (36.8 C)  TempSrc:   Oral Oral  SpO2: 95% 98% 99% 95%  Weight:      Height:       SpO2: 95 % O2 Flow Rate (L/min): 1 L/min   Intake/Output Summary (Last 24 hours) at 03/03/2024 0738 Last data filed at 03/02/2024 2305 Gross per 24 hour  Intake 183.59 ml  Output 100 ml  Net 83.59 ml   Filed Weights   03/01/24 1000  Weight: 109 kg    Exam: General exam: In no acute distress. Respiratory system: Good air movement and clear to auscultation. Cardiovascular system: S1 & S2 heard, RRR. No JVD. Gastrointestinal system: Abdomen is nondistended, soft and nontender.   Extremities: No pedal edema. Skin: No rashes, lesions or ulcers Psychiatry: Judgement and insight appear normal. Mood & affect appropriate.    Data Reviewed:    Labs: Basic Metabolic Panel: Recent Labs  Lab 02/29/24 1407 03/01/24 0542 03/02/24 0403 03/03/24 0243  NA 136 139 139 138  K 3.6 3.4* 4.0 4.3  CL 100 106 105 106  CO2 25 22 25 26   GLUCOSE 130* 99 106* 108*  BUN 59* 45* 30* 27*  CREATININE 1.79* 1.20* 1.07* 1.17*  CALCIUM  9.3 8.7* 8.6* 9.0  GFR Estimated Creatinine Clearance: 51.5 mL/min (A) (by C-G formula based on SCr of 1.17 mg/dL (H)). Liver Function Tests: Recent Labs  Lab 02/29/24 1626 03/01/24 0542 03/02/24 0827 03/03/24 0243  AST 122* 104* 93* 87*  ALT 79* 70* 62* 60*  ALKPHOS 102 89 86 87  BILITOT 1.0 0.9 0.8 0.5  PROT 6.8 5.9* 5.6* 5.4*  ALBUMIN 2.9* 2.5* 2.4* 2.2*   No results for input(s): LIPASE, AMYLASE in the last 168 hours. No results for input(s): AMMONIA in the last 168 hours. Coagulation  profile No results for input(s): INR, PROTIME in the last 168 hours. COVID-19 Labs  Recent Labs    03/01/24 0542  DDIMER 8.62*    Lab Results  Component Value Date   SARSCOV2NAA NEGATIVE 11/16/2021    CBC: Recent Labs  Lab 02/29/24 1407 03/01/24 0542 03/02/24 0403 03/03/24 0243  WBC 14.5* 9.8 9.3 11.3*  HGB 13.8 12.2 11.4* 10.8*  HCT 41.7 37.3 35.3* 34.1*  MCV 90.8 91.4 93.1 92.9  PLT 172 161 198 232   Cardiac Enzymes: Recent Labs  Lab 03/01/24 0542  CKTOTAL 333*   BNP (last 3 results) No results for input(s): PROBNP in the last 8760 hours. CBG: Recent Labs  Lab 03/01/24 2029  GLUCAP 132*   D-Dimer: Recent Labs    03/01/24 0542  DDIMER 8.62*   Hgb A1c: No results for input(s): HGBA1C in the last 72 hours. Lipid Profile: No results for input(s): CHOL, HDL, LDLCALC, TRIG, CHOLHDL, LDLDIRECT in the last 72 hours. Thyroid  function studies: Recent Labs    03/01/24 0542 03/01/24 1427  TSH 9.674*  --   T3FREE  --  2.1   Anemia work up: Recent Labs    03/02/24 0403  VITAMINB12 496   Sepsis Labs: Recent Labs  Lab 02/29/24 1407 03/01/24 0542 03/02/24 0403 03/03/24 0243  WBC 14.5* 9.8 9.3 11.3*   Microbiology No results found for this or any previous visit (from the past 240 hours).   Medications:    aspirin   81 mg Oral Daily   gabapentin   600 mg Oral QHS   levothyroxine   25 mcg Oral Q0600   multivitamin with minerals  1 tablet Oral Daily   Continuous Infusions:  heparin 1,250 Units/hr (03/02/24 2305)      LOS: 2 days   Erle Odell Castor  [facility]  03/03/2024, 7:38 AM

## 2024-03-03 NOTE — Plan of Care (Signed)
  Problem: Education: Goal: Knowledge of General Education information will improve Description: Including pain rating scale, medication(s)/side effects and non-pharmacologic comfort measures Outcome: Progressing   Problem: Clinical Measurements: Goal: Will remain free from infection Outcome: Progressing   Problem: Activity: Goal: Risk for activity intolerance will decrease Outcome: Progressing   Problem: Nutrition: Goal: Adequate nutrition will be maintained Outcome: Progressing   Problem: Coping: Goal: Level of anxiety will decrease Outcome: Progressing   Problem: Elimination: Goal: Will not experience complications related to bowel motility Outcome: Progressing   Problem: Elimination: Goal: Will not experience complications related to bowel motility Outcome: Progressing   Problem: Elimination: Goal: Will not experience complications related to urinary retention Outcome: Progressing   Problem: Pain Managment: Goal: General experience of comfort will improve and/or be controlled Outcome: Progressing   Problem: Safety: Goal: Ability to remain free from injury will improve Outcome: Progressing

## 2024-03-03 NOTE — TOC Progression Note (Signed)
 Transition of Care [facility]) - Progression Note    Patient Details  Name: Veronica Weber MRN: [id number] Date of Birth: 1956/04/03  [facility] CM/SW Contact  Latoia Eyster A [place], LCSW Phone Number: 03/03/2024, 4:01 PM  Clinical Narrative:     CSW was contacted by pt's son Veronica Weber, they chose bed at [facility] for SNF placement. CSW reached out to facility and notified them of bed acceptance. DC date net yet established, will update once known.    TOC will continue to follow.        Expected Discharge Plan and Services                                               Social Determinants of Health (SDOH) Interventions SDOH Screenings   Food Insecurity: No Food Insecurity (03/02/2024)  Housing: Low Risk  (03/02/2024)  Transportation Needs: No Transportation Needs (03/02/2024)  Utilities: Not At Risk (03/02/2024)  Depression (PHQ2-9): High Risk (08/27/2023)  Financial Resource Strain: Low Risk  (02/12/2023)  Physical Activity: Sufficiently Active (02/12/2023)  Social Connections: Socially Isolated (03/02/2024)  Stress: No Stress Concern Present (02/12/2023)  Tobacco Use: Medium Risk (02/29/2024)    Readmission Risk Interventions    03/01/2024    3:22 PM  Readmission Risk Prevention Plan  Post Dischage Appt Complete  Medication Screening Complete  Transportation Screening Complete

## 2024-03-03 NOTE — Progress Notes (Signed)
 PHARMACY - ANTICOAGULATION CONSULT NOTE  Pharmacy Consult for heparin  Indication: acute PE and left DVT  No Known Allergies  Patient Measurements: Height: 5' (152.4 cm) Weight: 109 kg (240 lb 4.8 oz) IBW/kg (Calculated) : 45.5 HEPARIN DW (KG): 72.5  Vital Signs: Temp: 97.9 F (36.6 C) (07/03 0901) Temp Source: Oral (07/03 0433) BP: 120/66 (07/03 0901) Pulse Rate: 102 (07/03 0901)  Labs: Recent Labs    03/01/24 0542 03/01/24 1750 03/02/24 0403 03/03/24 0243  HGB 12.2  --  11.4* 10.8*  HCT 37.3  --  35.3* 34.1*  PLT 161  --  198 232  HEPARINUNFRC  --  0.29* 0.46 0.49  CREATININE 1.20*  --  1.07* 1.17*  CKTOTAL 333*  --   --   --     Estimated Creatinine Clearance: 51.5 mL/min (A) (by C-G formula based on SCr of 1.17 mg/dL (H)).  Assessment: Veronica Weber is a 68 y.o. year old female admitted on 02/29/2024 with acute PE / left DVT on imaging. Pharmacy consulted to manage IV heparin.  Heparin level therapeutic at 0.49 on 1250 units/hr. No bleeding noted, Hgb down to 10.8, platelets are normal.  Goal of Therapy:  Heparin level 0.3-0.7 units/ml Monitor platelets by anticoagulation protocol: Yes   Plan:  Continue heparin drip at 1250 units/hr Daily heparin level, CBC, and monitoring for bleeding Likely transition to DOAC on 7/4  Thank you for involving pharmacy in this patient's care.  Delon Sax, PharmD, BCPS Clinical Pharmacist Clinical phone for 03/03/2024 is [phone] 03/03/2024 12:19 PM

## 2024-03-03 NOTE — Progress Notes (Addendum)
 Mobility Specialist: Progress Note   03/03/24 1504  Mobility  Activity Ambulated with assistance in hallway  Level of Assistance Contact guard assist, steadying assist  Assistive Device Front wheel walker  Distance Ambulated (ft) 100 ft  Activity Response Tolerated well  Mobility Referral Yes  Mobility visit 1 Mobility  Mobility Specialist Start Time (ACUTE ONLY) 0947  Mobility Specialist Stop Time (ACUTE ONLY) 1015  Mobility Specialist Time Calculation (min) (ACUTE ONLY) 28 min    Pt received in bed, agreeable to mobility session. CGA throughout. No complaints but feeling fatigued by EOS. SpO2 91-92% on 1LO2 throughout ambulation. Returned to room without fault. Left in chair with all needs met, call bell in reach.   Ileana Lute Mobility Specialist Please contact via SecureChat or Rehab office at [phone]

## 2024-03-03 NOTE — Progress Notes (Signed)
 Patient weaned to RA. SPO2 remains at 97%.

## 2024-03-04 DIAGNOSIS — N179 Acute kidney failure, unspecified: Secondary | ICD-10-CM | POA: Diagnosis not present

## 2024-03-04 LAB — CBC
HCT: 35.5 % — ABNORMAL LOW (ref 36.0–46.0)
Hemoglobin: 11.4 g/dL — ABNORMAL LOW (ref 12.0–15.0)
MCH: 29.8 pg (ref 26.0–34.0)
MCHC: 32.1 g/dL (ref 30.0–36.0)
MCV: 92.7 fL (ref 80.0–100.0)
Platelets: 250 K/uL (ref 150–400)
RBC: 3.83 MIL/uL — ABNORMAL LOW (ref 3.87–5.11)
RDW: 15.2 % (ref 11.5–15.5)
WBC: 9.6 K/uL (ref 4.0–10.5)
nRBC: 0 % (ref 0.0–0.2)

## 2024-03-04 LAB — HEPARIN LEVEL (UNFRACTIONATED): Heparin Unfractionated: 0.58 [IU]/mL (ref 0.30–0.70)

## 2024-03-04 MED ORDER — APIXABAN 5 MG PO TABS
10.0000 mg | ORAL_TABLET | Freq: Two times a day (BID) | ORAL | Status: DC
  Administered 2024-03-04 – 2024-03-07 (×7): 10 mg via ORAL
  Filled 2024-03-04 (×7): qty 2

## 2024-03-04 MED ORDER — APIXABAN 5 MG PO TABS: 5.0000 mg | ORAL_TABLET | Freq: Two times a day (BID) | ORAL | Status: DC

## 2024-03-04 NOTE — Plan of Care (Signed)

## 2024-03-04 NOTE — Progress Notes (Signed)
 PHARMACY - ANTICOAGULATION CONSULT NOTE  Pharmacy Consult for heparin  > apixaban  Indication: acute PE and left DVT  No Known Allergies  Patient Measurements: Height: 5' (152.4 cm) Weight: 109 kg (240 lb 4.8 oz) IBW/kg (Calculated) : 45.5 HEPARIN  DW (KG): 72.5  Vital Signs: Temp: 97.4 F (36.3 C) (07/04 1147) Temp Source: Oral (07/04 1147) BP: 106/56 (07/04 1147) Pulse Rate: 95 (07/04 1147)  Labs: Recent Labs    03/02/24 0403 03/03/24 0243 03/04/24 0220  HGB 11.4* 10.8* 11.4*  HCT 35.3* 34.1* 35.5*  PLT 198 232 250  HEPARINUNFRC 0.46 0.49 0.58  CREATININE 1.07* 1.17*  --     Estimated Creatinine Clearance: 51.5 mL/min (A) (by C-G formula based on SCr of 1.17 mg/dL (H)).  Assessment: Veronica Weber is a 68 y.o. year old female admitted on 02/29/2024 with acute PE / left DVT on imaging. Pharmacy consulted to manage IV heparin .  Heparin  level therapeutic at 0.58 on heparin  drip rate 1250 units/hr. No bleeding noted, Hgb stable 10-11, platelets are normal. Plan to stop heparin  today and transition to apixaban    Goal of Therapy:  Heparin  level 0.3-0.7 units/ml Monitor platelets by anticoagulation protocol: Yes   Plan:  Apixaban  10mg  BID x7 days then 5mg  BID Stop heparin  when starting apixaban   Monitor s/s bleeding    Olam Chalk Pharm.D. CPP, BCPS Clinical Pharmacist [phone] 03/04/2024 12:01 PM

## 2024-03-04 NOTE — Progress Notes (Signed)
 Physical Therapy Treatment Patient Details Name: Veronica Weber MRN: [id number] DOB: 12-15-1955 Today's Date: 03/04/2024   History of Present Illness Pt is a 68 y.o. female admitted 6/30 with acute renal failure. She fell at home and was unable to get up off the floor. PMH: HTN, CVA, tobacco use, L TKA (2023), obesity, OA    PT Comments  Pt reports fatigue from walking with Mobility Specialist. Agreeable to working on sit to stand and balance. Pt also inquires about use of incentive spirometer. PT educated pt on proper technique as well as correcting frequency from 10x/day to 10x/hr. Pt able to perform max inhalation of 625 mL. PT will continue to follow acutely. Pt remains hopeful for discharge to SNF.     If plan is discharge home, recommend the following: A little help with walking and/or transfers;A little help with bathing/dressing/bathroom;Assistance with cooking/housework;Assist for transportation;Help with stairs or ramp for entrance   Can travel by private vehicle     Yes  Equipment Recommendations  None recommended by PT       Precautions / Restrictions Precautions Precautions: Fall Restrictions Weight Bearing Restrictions Per Provider Order: No     Mobility  Bed Mobility Overal bed mobility: Needs Assistance Bed Mobility: Supine to Sit, Sit to Supine     Supine to sit: Min assist, Used rails     General bed mobility comments: sitting up in recliner on entry    Transfers Overall transfer level: Needs assistance Equipment used: Rolling walker (2 wheels) Transfers: Sit to/from Stand Sit to Stand: Contact guard assist           General transfer comment: cues for hand placement for power up and self steady    Ambulation/Gait               General Gait Details: pt reports fatigue from walking with Mobility Specialist earlier.       Balance Overall balance assessment: Needs assistance Sitting-balance support: No upper extremity  supported Sitting balance-Leahy Scale: Good     Standing balance support: No upper extremity supported Standing balance-Leahy Scale: Fair Standing balance comment: benefits from RW support with dynamic balance         Rhomberg - Eyes Opened: 30 Rhomberg - Eyes Closed: 15                Communication Communication Communication: No apparent difficulties  Cognition Arousal: Alert Behavior During Therapy: WFL for tasks assessed/performed   PT - Cognitive impairments: No apparent impairments                         Following commands: Intact      Cueing Cueing Techniques: Verbal cues  Exercises Other Exercises Other Exercises: 5x sit to stand, 3 set Other Exercises: incentive spirometer 2 sets of 5, max inhalation 625 mL    General Comments General comments (skin integrity, edema, etc.): pt with questions about incentive spirometer use, pt educated and able to teach back      Pertinent Vitals/Pain Pain Assessment Pain Assessment: Faces Faces Pain Scale: Hurts little more Pain Location: back with static standing Pain Descriptors / Indicators: Discomfort, Grimacing Pain Intervention(s): Limited activity within patient's tolerance, Monitored during session, Repositioned     PT Goals (current goals can now be found in the care plan section) Acute Rehab PT Goals PT Goal Formulation: With patient Time For Goal Achievement: 03/15/24 Potential to Achieve Goals: Good Progress towards PT goals: Progressing toward goals  Frequency    Min 2X/week       AM-PAC PT 6 Clicks Mobility   Outcome Measure  Help needed turning from your back to your side while in a flat bed without using bedrails?: A Little Help needed moving from lying on your back to sitting on the side of a flat bed without using bedrails?: A Little Help needed moving to and from a bed to a chair (including a wheelchair)?: A Little Help needed standing up from a chair using your arms  (e.g., wheelchair or bedside chair)?: A Little Help needed to walk in hospital room?: A Little Help needed climbing 3-5 steps with a railing? : A Little 6 Click Score: 18    End of Session Equipment Utilized During Treatment: Gait belt Activity Tolerance: Patient tolerated treatment well Patient left: in chair;with call bell/phone within reach Nurse Communication: Mobility status PT Visit Diagnosis: Muscle weakness (generalized) (M62.81);Difficulty in walking, not elsewhere classified (R26.2);Repeated falls (R29.6)     Time: 1440-1511 PT Time Calculation (min) (ACUTE ONLY): 31 min  Charges:    $Therapeutic Exercise: 23-37 mins PT General Charges $$ ACUTE PT VISIT: 1 Visit                     Ashante Yellin B. Fleeta Lapidus PT, DPT Acute Rehabilitation Services Please use secure chat or  Call Office [phone]    Almarie KATHEE Fleeta [facility] 03/04/2024, 3:26 PM

## 2024-03-04 NOTE — TOC Progression Note (Signed)
 Transition of Care [facility]) - Progression Note    Patient Details  Name: Veronica Weber MRN: [id number] Date of Birth: August 14, 1956  [facility] CM/SW Contact  Isaiah Public, LCSWA Phone Number: 03/04/2024, 10:47 AM  Clinical Narrative:      CSW spoke with Kristin with [facility] who confirmed SNF bed for patient. Allean informed CSW facility can accept patient on Monday. Weekend CSW to follow and start insurance authorization over the weekend to be ready for Monday if patient medically stable. CSW will continue to follow.      Expected Discharge Plan and Services                                               Social Determinants of Health (SDOH) Interventions SDOH Screenings   Food Insecurity: No Food Insecurity (03/02/2024)  Housing: Low Risk  (03/02/2024)  Transportation Needs: No Transportation Needs (03/02/2024)  Utilities: Not At Risk (03/02/2024)  Depression (PHQ2-9): High Risk (08/27/2023)  Financial Resource Strain: Low Risk  (02/12/2023)  Physical Activity: Sufficiently Active (02/12/2023)  Social Connections: Socially Isolated (03/02/2024)  Stress: No Stress Concern Present (02/12/2023)  Tobacco Use: Medium Risk (02/29/2024)    Readmission Risk Interventions    03/01/2024    3:22 PM  Readmission Risk Prevention Plan  Post Dischage Appt Complete  Medication Screening Complete  Transportation Screening Complete

## 2024-03-04 NOTE — Care Management Important Message (Signed)
 Important Message  Patient Details  Name: Veronica Weber MRN: [id number] Date of Birth: 13-Nov-1955   Important Message Given:  Yes - Medicare IM     Claretta Deed 03/04/2024, 12:52 PM

## 2024-03-04 NOTE — Progress Notes (Signed)
 Mobility Specialist: Progress Note   03/04/24 1452  Mobility  Activity Ambulated with assistance in hallway  Level of Assistance Contact guard assist, steadying assist  Assistive Device Front wheel walker  Distance Ambulated (ft) 100 ft  Activity Response Tolerated well  Mobility Referral Yes  Mobility visit 1 Mobility  Mobility Specialist Start Time (ACUTE ONLY) 0930  Mobility Specialist Stop Time (ACUTE ONLY) 0951  Mobility Specialist Time Calculation (min) (ACUTE ONLY) 21 min    Pt received in bed, agreeable to mobility session. CG throughout. SpO2 91-94% on RA. C/o bil leg weakness and R knee pain. Took 1x standing break d/t fatigue. Returned to room without fault. Left in bed with all needs met, call bell in reach.   Ileana Lute Mobility Specialist Please contact via SecureChat or Rehab office at [phone]

## 2024-03-04 NOTE — Progress Notes (Signed)
 [facility] PROGRESS NOTE    Progress Note  Veronica Weber  FMW:[id number] DOB: 1956/03/16 DOA: 02/29/2024 PCP: Kathrene Mardy HERO, PA-C     Brief Narrative:   Veronica Weber is an 68 y.o. female past medical history of CVA, essential hypertension hyperlipidemia comes in for frequent falls and weakness that started 4 days prior to admission she fell on the floor could not get up, in the ED was noted to be hypotensive found to be in acute kidney injury with abnormal LFTs CT of the head showed no acute findings  Assessment/Plan:   Acute kidney injury: With a previous creatinine of less than 1 on admission 1.7.  Likely prerenal azotemia Diuretics and ARB were held. KVO IV fluids her creatinine has returned to baseline. PT evaluated the patient, will need skilled nursing facility. Social worker is speaking to son about [facility]. Patient is medically stable to be discharged.  Acute respiratory failure with hypoxia due to acute PE/DVT: Lower extremity Doppler showed a left DVT and VQ scan was positive for large perfusion right defect likely PE. Transition to oral Eliquis . 2D echo showed an EF of 65% right systolic function is unremarkable. Out of bed to chair, try to wean to room air.  Hypotension/essential hypertension: Multifactorial likely due to dehydration hypovolemia and probably PE. Antihypertensive medications were held on admission, with started on IV fluids. Blood pressure is nicely improving. Continue to hold antihypertensive medication.  Frequent falls: PT OT evaluated the patient, awaiting skilled nursing facility placement.  Elevated LFTs: Statins were held in the setting of hypotension. Hepatitis panel was negative. LFTs are trending down abdominal ultrasound showed mildly prominent CBD status post cholecystectomy.  Hypothyroidism: She was being noncompliant with her Synthroid . Resumed Synthroid  recheck TSH and  free T4 as an outpatient in 4 to 6 weeks.  History of CVA: Holding statins continue aspirin .  Diarrhea: Now resolved.  History of headaches: Continue gabapentin .  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  Hypokalemia: Replete try to keep greater than 4.    DVT prophylaxis: Heparin  Family Communication:none Status is: Inpatient Remains inpatient appropriate because: Will need skilled nursing facility    Code Status:     Code Status Orders  (From admission, onward)           Start     Ordered   02/29/24 2152  Full code  Continuous       Question:  By:  Answer:  Consent: discussion documented in EHR   02/29/24 2153           Code Status History     Date Active Date Inactive Code Status Order ID Comments User Context   05/22/2022 1215 05/25/2022 0124 Full Code [id number]  Addie Cordella Hamilton, MD Inpatient   11/19/2021 1352 11/23/2021 1412 Full Code [id number]  Pegge Toribio JINNY, PA-C Inpatient   11/16/2021 1950 11/19/2021 1347 Full Code [id number]  Tobie Jorie SAUNDERS, MD ED         IV Access:   Peripheral IV   Procedures and diagnostic studies:   DG Lumbar Spine 2-3 Views Result Date: 03/02/2024 CLINICAL DATA:  Back pain. EXAM: LUMBAR SPINE - 2-3 VIEW COMPARISON:  Abdominal radiograph dated 10/07/2021. FINDINGS: Five lumbar type vertebra. There is no acute fracture or subluxation of the lumbar spine. Mild chronic compression fracture of superior endplate of L2. Multilevel degenerative changes and spurring. Lower lumbar  facet arthropathy. Atherosclerotic calcification of the abdominal aorta. There is a 4.2 cm abdominal aortic aneurysm. Right upper quadrant cholecystectomy clips. IMPRESSION: 1. No acute findings. 2. Multilevel degenerative changes. 3. A 4.2 cm abdominal aortic aneurysm. Recommend follow-up CT or MR as appropriate in 12 months and referral to or continued care  with vascular specialist. (Ref.: J Vasc Surg. 2018; 67:2-77 and J Am Coll Radiol 2013;10(10):789-794.) Electronically Signed   By: Vanetta Chou M.D.   On: 03/02/2024 16:37     Medical Consultants:   None.   Subjective:    Veronica Weber relates her breathing is about the same.  Objective:    Vitals:   03/03/24 1749 03/03/24 2000 03/04/24 0022 03/04/24 0519  BP: 110/63 98/60 115/67 107/62  Pulse: 93 91 87 94  Resp:   17 17  Temp: 98.1 F (36.7 C) 97.8 F (36.6 C) 98.1 F (36.7 C) 98.2 F (36.8 C)  TempSrc:  Oral Oral Oral  SpO2: 98% 95% 98% 98%  Weight:      Height:       SpO2: 98 % O2 Flow Rate (L/min): 1 L/min   Intake/Output Summary (Last 24 hours) at 03/04/2024 9297 Last data filed at 03/04/2024 0606 Gross per 24 hour  Intake --  Output 900 ml  Net -900 ml   Filed Weights   03/01/24 1000  Weight: 109 kg    Exam: General exam: In no acute distress. Respiratory system: Good air movement and clear to auscultation. Cardiovascular system: S1 & S2 heard, RRR. No JVD. Gastrointestinal system: Abdomen is nondistended, soft and nontender.   Extremities: No pedal edema. Skin: No rashes, lesions or ulcers Psychiatry: Judgement and insight appear normal. Mood & affect appropriate.    Data Reviewed:    Labs: Basic Metabolic Panel: Recent Labs  Lab 02/29/24 1407 03/01/24 0542 03/02/24 0403 03/03/24 0243  NA 136 139 139 138  K 3.6 3.4* 4.0 4.3  CL 100 106 105 106  CO2 25 22 25 26   GLUCOSE 130* 99 106* 108*  BUN 59* 45* 30* 27*  CREATININE 1.79* 1.20* 1.07* 1.17*  CALCIUM  9.3 8.7* 8.6* 9.0   GFR Estimated Creatinine Clearance: 51.5 mL/min (A) (by C-G formula based on SCr of 1.17 mg/dL (H)). Liver Function Tests: Recent Labs  Lab 02/29/24 1626 03/01/24 0542 03/02/24 0827 03/03/24 0243  AST 122* 104* 93* 87*  ALT 79* 70* 62* 60*  ALKPHOS 102 89 86 87  BILITOT 1.0 0.9 0.8 0.5  PROT 6.8 5.9* 5.6* 5.4*  ALBUMIN  2.9* 2.5* 2.4* 2.2*    No results for input(s): LIPASE, AMYLASE in the last 168 hours. No results for input(s): AMMONIA in the last 168 hours. Coagulation profile No results for input(s): INR, PROTIME in the last 168 hours. COVID-19 Labs  No results for input(s): DDIMER, FERRITIN, LDH, CRP in the last 72 hours.   Lab Results  Component Value Date   SARSCOV2NAA NEGATIVE 11/16/2021    CBC: Recent Labs  Lab 02/29/24 1407 03/01/24 0542 03/02/24 0403 03/03/24 0243 03/04/24 0220  WBC 14.5* 9.8 9.3 11.3* 9.6  HGB 13.8 12.2 11.4* 10.8* 11.4*  HCT 41.7 37.3 35.3* 34.1* 35.5*  MCV 90.8 91.4 93.1 92.9 92.7  PLT 172 161 198 232 250   Cardiac Enzymes: Recent Labs  Lab 03/01/24 0542  CKTOTAL 333*   BNP (last 3 results) No results for input(s): PROBNP in the last 8760 hours. CBG: Recent Labs  Lab 03/01/24 2029  GLUCAP 132*   D-Dimer: No  results for input(s): DDIMER in the last 72 hours.  Hgb A1c: No results for input(s): HGBA1C in the last 72 hours. Lipid Profile: No results for input(s): CHOL, HDL, LDLCALC, TRIG, CHOLHDL, LDLDIRECT in the last 72 hours. Thyroid  function studies: Recent Labs    03/01/24 1427  T3FREE 2.1   Anemia work up: Recent Labs    03/02/24 0403  VITAMINB12 496   Sepsis Labs: Recent Labs  Lab 03/01/24 0542 03/02/24 0403 03/03/24 0243 03/04/24 0220  WBC 9.8 9.3 11.3* 9.6   Microbiology No results found for this or any previous visit (from the past 240 hours).   Medications:    aspirin   81 mg Oral Daily   gabapentin   600 mg Oral QHS   levothyroxine   25 mcg Oral Q0600   multivitamin with minerals  1 tablet Oral Daily   Continuous Infusions:  heparin  1,250 Units/hr (03/03/24 1555)      LOS: 3 days   Erle Odell Castor  [facility]  03/04/2024, 7:02 AM

## 2024-03-04 NOTE — Progress Notes (Signed)
Incentive spirometry introduced with teach back 

## 2024-03-05 DIAGNOSIS — N179 Acute kidney failure, unspecified: Secondary | ICD-10-CM | POA: Diagnosis not present

## 2024-03-05 LAB — CBC
HCT: 35.1 % — ABNORMAL LOW (ref 36.0–46.0)
Hemoglobin: 11.3 g/dL — ABNORMAL LOW (ref 12.0–15.0)
MCH: 30.1 pg (ref 26.0–34.0)
MCHC: 32.2 g/dL (ref 30.0–36.0)
MCV: 93.4 fL (ref 80.0–100.0)
Platelets: 271 K/uL (ref 150–400)
RBC: 3.76 MIL/uL — ABNORMAL LOW (ref 3.87–5.11)
RDW: 15.1 % (ref 11.5–15.5)
WBC: 7.8 K/uL (ref 4.0–10.5)
nRBC: 0 % (ref 0.0–0.2)

## 2024-03-05 LAB — BASIC METABOLIC PANEL WITH GFR
Anion gap: 5 (ref 5–15)
BUN: 16 mg/dL (ref 8–23)
CO2: 27 mmol/L (ref 22–32)
Calcium: 9.4 mg/dL (ref 8.9–10.3)
Chloride: 108 mmol/L (ref 98–111)
Creatinine, Ser: 0.9 mg/dL (ref 0.44–1.00)
GFR, Estimated: >60 mL/min (ref >60)
Glucose, Bld: 108 mg/dL — ABNORMAL HIGH (ref 70–99)
Potassium: 4.3 mmol/L (ref 3.5–5.1)
Sodium: 140 mmol/L (ref 135–145)

## 2024-03-05 NOTE — Progress Notes (Signed)
   03/05/24 1507  Mobility  Activity Ambulated with assistance in hallway  Level of Assistance Contact guard assist, steadying assist  Assistive Device Front wheel walker  Distance Ambulated (ft) 50 ft  Activity Response Tolerated well  Mobility Referral Yes  Mobility visit 1 Mobility  Mobility Specialist Start Time (ACUTE ONLY) 1507  Mobility Specialist Stop Time (ACUTE ONLY) 1530  Mobility Specialist Time Calculation (min) (ACUTE ONLY) 23 min   Mobility Specialist: Progress Note  Pre-Mobility:      SpO2 94% RA During Mobility: SpO2 96% RA Post-Mobility:    SpO2 95% RA  Pt agreeable to mobility session - received in chair. Pt was asymptomatic throughout session with no complaints. Returned to chair with all needs met - call bell within reach. Chair alarm on. Son Present.   Veronica Weber, BS Mobility Specialist Please contact via SecureChat or  Rehab office at [phone].

## 2024-03-05 NOTE — Plan of Care (Signed)

## 2024-03-05 NOTE — Progress Notes (Signed)
 [facility] PROGRESS NOTE    Progress Note  Veronica Weber  FMW:[id number] DOB: 1956-08-02 DOA: 02/29/2024 PCP: Kathrene Mardy HERO, PA-C     Brief Narrative:   Veronica Weber is an 68 y.o. female past medical history of CVA, essential hypertension hyperlipidemia comes in for frequent falls and weakness that started 4 days prior to admission she fell on the floor could not get up, in the ED was noted to be hypotensive found to be in acute kidney injury with abnormal LFTs CT of the head showed no acute findings  Assessment/Plan:   Acute kidney injury: With a previous creatinine of less than 1 on admission 1.7.  Likely prerenal azotemia Diuretics and ARB were held. KVO IV fluids her creatinine has returned to baseline. PT evaluated the patient, will need skilled nursing facility. Social worker is speaking to son about [facility]. Patient is medically stable to be discharged.  Acute respiratory failure with hypoxia due to acute PE/DVT: Lower extremity Doppler showed a left DVT and VQ scan was positive for large perfusion right defect likely PE. Transition to oral Eliquis . 2D echo showed an EF of 65% right systolic function is unremarkable. Out of bed to chair, try to wean to room air.  Hypotension/essential hypertension: Multifactorial likely due to dehydration hypovolemia and probably PE. Antihypertensive medications were held on admission, with started on IV fluids. Blood pressure is nicely improving. Continue to hold antihypertensive medication.  Frequent falls: PT OT evaluated the patient, awaiting skilled nursing facility placement.  Elevated LFTs: Statins were held in the setting of hypotension. Hepatitis panel was negative. LFTs are trending down abdominal ultrasound showed mildly prominent CBD status post cholecystectomy.  Hypothyroidism: She was being noncompliant with her Synthroid . Resumed Synthroid  recheck TSH and  free T4 as an outpatient in 4 to 6 weeks.  History of CVA: Holding statins continue aspirin .  Diarrhea: Now resolved.  History of headaches: Continue gabapentin .  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  Hypokalemia: Replete try to keep greater than 4.    DVT prophylaxis: Heparin  Family Communication:none Status is: Inpatient Remains inpatient appropriate because: Will need skilled nursing facility    Code Status:     Code Status Orders  (From admission, onward)           Start     Ordered   02/29/24 2152  Full code  Continuous       Question:  By:  Answer:  Consent: discussion documented in EHR   02/29/24 2153           Code Status History     Date Active Date Inactive Code Status Order ID Comments User Context   05/22/2022 1215 05/25/2022 0124 Full Code [id number]  Addie Cordella Hamilton, MD Inpatient   11/19/2021 1352 11/23/2021 1412 Full Code [id number]  Pegge Toribio JINNY, PA-C Inpatient   11/16/2021 1950 11/19/2021 1347 Full Code [id number]  Tobie Jorie SAUNDERS, MD ED         IV Access:   Peripheral IV   Procedures and diagnostic studies:   No results found.    Medical Consultants:   None.   Subjective:    Veronica Weber complaints today  Objective:    Vitals:   03/04/24 1730 03/04/24 1957 03/04/24 2356 03/05/24 0401  BP: 131/71 (!) 129/53 123/64 111/61  Pulse: 96 95 89 99  Resp:  19 18 19  Temp: 97.6 F (36.4 C) 98.2 F (36.8 C) 97.8 F (36.6 C) 97.8 F (36.6 C)  TempSrc:  Oral Oral Oral  SpO2: 98% 95% 99% 92%  Weight:      Height:       SpO2: 92 % O2 Flow Rate (L/min): 1 L/min  No intake or output data in the 24 hours ending 03/05/24 0705  Filed Weights   03/01/24 1000  Weight: 109 kg    Exam: General exam: In no acute distress. Respiratory system: Good air movement and clear to auscultation. Cardiovascular  system: S1 & S2 heard, RRR. No JVD. Gastrointestinal system: Abdomen is nondistended, soft and nontender.  Extremities: No pedal edema. Skin: No rashes, lesions or ulcers Psychiatry: Judgement and insight appear normal. Mood & affect appropriate.    Data Reviewed:    Labs: Basic Metabolic Panel: Recent Labs  Lab 02/29/24 1407 03/01/24 0542 03/02/24 0403 03/03/24 0243 03/05/24 0523  NA 136 139 139 138 140  K 3.6 3.4* 4.0 4.3 4.3  CL 100 106 105 106 108  CO2 25 22 25 26 27   GLUCOSE 130* 99 106* 108* 108*  BUN 59* 45* 30* 27* 16  CREATININE 1.79* 1.20* 1.07* 1.17* 0.90  CALCIUM  9.3 8.7* 8.6* 9.0 9.4   GFR Estimated Creatinine Clearance: 67 mL/min (by C-G formula based on SCr of 0.9 mg/dL). Liver Function Tests: Recent Labs  Lab 02/29/24 1626 03/01/24 0542 03/02/24 0827 03/03/24 0243  AST 122* 104* 93* 87*  ALT 79* 70* 62* 60*  ALKPHOS 102 89 86 87  BILITOT 1.0 0.9 0.8 0.5  PROT 6.8 5.9* 5.6* 5.4*  ALBUMIN  2.9* 2.5* 2.4* 2.2*   No results for input(s): LIPASE, AMYLASE in the last 168 hours. No results for input(s): AMMONIA in the last 168 hours. Coagulation profile No results for input(s): INR, PROTIME in the last 168 hours. COVID-19 Labs  No results for input(s): DDIMER, FERRITIN, LDH, CRP in the last 72 hours.   Lab Results  Component Value Date   SARSCOV2NAA NEGATIVE 11/16/2021    CBC: Recent Labs  Lab 03/01/24 0542 03/02/24 0403 03/03/24 0243 03/04/24 0220 03/05/24 0523  WBC 9.8 9.3 11.3* 9.6 7.8  HGB 12.2 11.4* 10.8* 11.4* 11.3*  HCT 37.3 35.3* 34.1* 35.5* 35.1*  MCV 91.4 93.1 92.9 92.7 93.4  PLT 161 198 232 250 271   Cardiac Enzymes: Recent Labs  Lab 03/01/24 0542  CKTOTAL 333*   BNP (last 3 results) No results for input(s): PROBNP in the last 8760 hours. CBG: Recent Labs  Lab 03/01/24 2029  GLUCAP 132*   D-Dimer: No results for input(s): DDIMER in the last 72 hours.  Hgb A1c: No results for input(s):  HGBA1C in the last 72 hours. Lipid Profile: No results for input(s): CHOL, HDL, LDLCALC, TRIG, CHOLHDL, LDLDIRECT in the last 72 hours. Thyroid  function studies: No results for input(s): TSH, T4TOTAL, T3FREE, THYROIDAB in the last 72 hours.  Invalid input(s): FREET3  Anemia work up: No results for input(s): VITAMINB12, FOLATE, FERRITIN, TIBC, IRON, RETICCTPCT in the last 72 hours.  Sepsis Labs: Recent Labs  Lab 03/02/24 0403 03/03/24 0243 03/04/24 0220 03/05/24 0523  WBC 9.3 11.3* 9.6 7.8   Microbiology No results found for this or any previous visit (from the past 240 hours).   Medications:    apixaban   10 mg Oral BID   Followed by   NOREEN ON 03/11/2024] apixaban   5 mg Oral BID   aspirin   81 mg Oral Daily   gabapentin   600 mg Oral QHS   levothyroxine   25 mcg Oral Q0600   multivitamin with minerals  1 tablet Oral Daily   Continuous Infusions:      LOS: 4 days   Erle Odell Castor  [facility]  03/05/2024, 7:05 AM

## 2024-03-06 DIAGNOSIS — N179 Acute kidney failure, unspecified: Secondary | ICD-10-CM | POA: Diagnosis not present

## 2024-03-06 NOTE — TOC Progression Note (Signed)
 Transition of Care [facility]) - Progression Note    Patient Details  Name: OAKLEY ORBAN MRN: [id number] Date of Birth: Jun 07, 1956  Transition of Care [facility]) CM/SW Contact  Gwenn Frieze [place], [place] Phone Number: 03/06/2024, 12:10 PM  Clinical Narrative: Home and Community/UHC auth request submitted for [facility], reference [id number]. Anticipate dc Monday pending auth.   Frieze Gwenn, MSW, LCSW [phone] (coverage)             Expected Discharge Plan and Services                                               Social Determinants of Health (SDOH) Interventions SDOH Screenings   Food Insecurity: No Food Insecurity (03/02/2024)  Housing: Low Risk  (03/02/2024)  Transportation Needs: No Transportation Needs (03/02/2024)  Utilities: Not At Risk (03/02/2024)  Depression (PHQ2-9): High Risk (08/27/2023)  Financial Resource Strain: Low Risk  (02/12/2023)  Physical Activity: Sufficiently Active (02/12/2023)  Social Connections: Socially Isolated (03/02/2024)  Stress: No Stress Concern Present (02/12/2023)  Tobacco Use: Medium Risk (02/29/2024)    Readmission Risk Interventions    03/01/2024    3:22 PM  Readmission Risk Prevention Plan  Post Dischage Appt Complete  Medication Screening Complete  Transportation Screening Complete

## 2024-03-06 NOTE — Plan of Care (Signed)

## 2024-03-06 NOTE — Progress Notes (Addendum)
   03/06/24 1448  Mobility  Activity Ambulated with assistance in room  Level of Assistance Contact guard assist, steadying assist  Assistive Device Front wheel walker  Distance Ambulated (ft) 150 ft  Activity Response Tolerated fair  Mobility Referral Yes  Mobility visit 1 Mobility  Mobility Specialist Start Time (ACUTE ONLY) 1448  Mobility Specialist Stop Time (ACUTE ONLY) 1501  Mobility Specialist Time Calculation (min) (ACUTE ONLY) 13 min   Mobility Specialist: Progress Note  Post-Mobility:    HR 97, SpO2 97% RA  Pt agreeable to mobility session - received in chair. C/o L knee pain. Returned to chair with all needs met - call bell within reach. Chair alarm on.   Virgle Boards, BS Mobility Specialist Please contact via SecureChat or  Rehab office at [phone].

## 2024-03-06 NOTE — Progress Notes (Signed)
 [facility] PROGRESS NOTE    Progress Note  Veronica Weber  FMW:[id number] DOB: 19-May-1956 DOA: 02/29/2024 PCP: Kathrene Mardy HERO, PA-C     Brief Narrative:   Veronica Weber is an 68 y.o. female past medical history of CVA, essential hypertension hyperlipidemia comes in for frequent falls and weakness that started 4 days prior to admission she fell on the floor could not get up, in the ED was noted to be hypotensive found to be in acute kidney injury with abnormal LFTs CT of the head showed no acute findings  Assessment/Plan:   Acute kidney injury: With a previous creatinine of less than 1 on admission 1.7.  Likely prerenal azotemia Diuretics and ARB were held. KVO IV fluids her creatinine has returned to baseline. PT evaluated the patient, will need skilled nursing facility. Social worker is [facility]. Patient is medically stable to be discharged.  Acute respiratory failure with hypoxia due to acute PE/DVT: Lower extremity Doppler showed a left DVT and VQ scan was positive for large perfusion right defect likely PE. Transition to oral Eliquis . 2D echo showed an EF of 65% right systolic function is unremarkable. Out of bed to chair, try to wean to room air.  Hypotension/essential hypertension: Multifactorial likely due to dehydration hypovolemia and probably PE. Antihypertensive medications were held on admission, with started on IV fluids. Blood pressure is nicely improving. Continue to hold antihypertensive medication.  Frequent falls: PT OT evaluated the patient, awaiting skilled nursing facility placement.  Elevated LFTs: Statins were held in the setting of hypotension. Hepatitis panel was negative. LFTs are trending down abdominal ultrasound showed mildly prominent CBD status post cholecystectomy.  Hypothyroidism: She was being noncompliant with her Synthroid . Resumed Synthroid  recheck TSH and  free T4 as an outpatient in 4 to 6 weeks.  History of CVA: Holding statins continue aspirin .  Diarrhea: Now resolved.  History of headaches: Continue gabapentin .  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  Hypokalemia: Replete try to keep greater than 4.    DVT prophylaxis: Heparin  Family Communication:none Status is: Inpatient Remains inpatient appropriate because: Will need skilled nursing facility    Code Status:     Code Status Orders  (From admission, onward)           Start     Ordered   02/29/24 2152  Full code  Continuous       Question:  By:  Answer:  Consent: discussion documented in EHR   02/29/24 2153           Code Status History     Date Active Date Inactive Code Status Order ID Comments User Context   05/22/2022 1215 05/25/2022 0124 Full Code [id number]  Addie Cordella Hamilton, MD Inpatient   11/19/2021 1352 11/23/2021 1412 Full Code [id number]  Pegge Toribio JINNY, PA-C Inpatient   11/16/2021 1950 11/19/2021 1347 Full Code [id number]  Tobie Jorie SAUNDERS, MD ED         IV Access:   Peripheral IV   Procedures and diagnostic studies:   No results found.    Medical Consultants:   None.   Subjective:    Veronica Weber no complaints  Objective:    Vitals:   03/04/24 2356 03/05/24 0401 03/05/24 0728 03/05/24 1603  BP: 123/64 111/61 109/67 (!) 127/95  Pulse: 89 99 (!) 102 91  Resp: 18 19 19  Temp: 97.8 F (36.6 C) 97.8 F (36.6 C) 98.4 F (36.9 C) 98.6 F (37 C)  TempSrc: Oral Oral Oral Oral  SpO2: 99% 92% 92% 95%  Weight:      Height:       SpO2: 95 % O2 Flow Rate (L/min): 1 L/min   Intake/Output Summary (Last 24 hours) at 03/06/2024 0645 Last data filed at 03/05/2024 1200 Gross per 24 hour  Intake --  Output 600 ml  Net -600 ml    Filed Weights   03/01/24 1000  Weight: 109 kg    Exam: General exam: In no acute  distress. Respiratory system: Good air movement and clear to auscultation. Cardiovascular system: S1 & S2 heard, RRR. No JVD. Gastrointestinal system: Abdomen is nondistended, soft and nontender.  Extremities: No pedal edema. Skin: No rashes, lesions or ulcers Psychiatry: Judgement and insight appear normal. Mood & affect appropriate.    Data Reviewed:    Labs: Basic Metabolic Panel: Recent Labs  Lab 02/29/24 1407 03/01/24 0542 03/02/24 0403 03/03/24 0243 03/05/24 0523  NA 136 139 139 138 140  K 3.6 3.4* 4.0 4.3 4.3  CL 100 106 105 106 108  CO2 25 22 25 26 27   GLUCOSE 130* 99 106* 108* 108*  BUN 59* 45* 30* 27* 16  CREATININE 1.79* 1.20* 1.07* 1.17* 0.90  CALCIUM  9.3 8.7* 8.6* 9.0 9.4   GFR Estimated Creatinine Clearance: 67 mL/min (by C-G formula based on SCr of 0.9 mg/dL). Liver Function Tests: Recent Labs  Lab 02/29/24 1626 03/01/24 0542 03/02/24 0827 03/03/24 0243  AST 122* 104* 93* 87*  ALT 79* 70* 62* 60*  ALKPHOS 102 89 86 87  BILITOT 1.0 0.9 0.8 0.5  PROT 6.8 5.9* 5.6* 5.4*  ALBUMIN  2.9* 2.5* 2.4* 2.2*   No results for input(s): LIPASE, AMYLASE in the last 168 hours. No results for input(s): AMMONIA in the last 168 hours. Coagulation profile No results for input(s): INR, PROTIME in the last 168 hours. COVID-19 Labs  No results for input(s): DDIMER, FERRITIN, LDH, CRP in the last 72 hours.   Lab Results  Component Value Date   SARSCOV2NAA NEGATIVE 11/16/2021    CBC: Recent Labs  Lab 03/01/24 0542 03/02/24 0403 03/03/24 0243 03/04/24 0220 03/05/24 0523  WBC 9.8 9.3 11.3* 9.6 7.8  HGB 12.2 11.4* 10.8* 11.4* 11.3*  HCT 37.3 35.3* 34.1* 35.5* 35.1*  MCV 91.4 93.1 92.9 92.7 93.4  PLT 161 198 232 250 271   Cardiac Enzymes: Recent Labs  Lab 03/01/24 0542  CKTOTAL 333*   BNP (last 3 results) No results for input(s): PROBNP in the last 8760 hours. CBG: Recent Labs  Lab 03/01/24 2029  GLUCAP 132*   D-Dimer: No  results for input(s): DDIMER in the last 72 hours.  Hgb A1c: No results for input(s): HGBA1C in the last 72 hours. Lipid Profile: No results for input(s): CHOL, HDL, LDLCALC, TRIG, CHOLHDL, LDLDIRECT in the last 72 hours. Thyroid  function studies: No results for input(s): TSH, T4TOTAL, T3FREE, THYROIDAB in the last 72 hours.  Invalid input(s): FREET3  Anemia work up: No results for input(s): VITAMINB12, FOLATE, FERRITIN, TIBC, IRON, RETICCTPCT in the last 72 hours.  Sepsis Labs: Recent Labs  Lab 03/02/24 0403 03/03/24 0243 03/04/24 0220 03/05/24 0523  WBC 9.3 11.3* 9.6 7.8   Microbiology No results found for this or any previous visit (from the past 240 hours).   Medications:    apixaban   10 mg Oral BID   Followed by   [  START ON 03/11/2024] apixaban   5 mg Oral BID   aspirin   81 mg Oral Daily   gabapentin   600 mg Oral QHS   levothyroxine   25 mcg Oral Q0600   multivitamin with minerals  1 tablet Oral Daily   Continuous Infusions:      LOS: 5 days   Erle Odell Castor  [facility]  03/06/2024, 6:45 AM

## 2024-03-07 ENCOUNTER — Other Ambulatory Visit (HOSPITAL_COMMUNITY): Payer: Self-pay

## 2024-03-07 DIAGNOSIS — Z8673 Personal history of transient ischemic attack (TIA), and cerebral infarction without residual deficits: Secondary | ICD-10-CM | POA: Diagnosis not present

## 2024-03-07 DIAGNOSIS — Z7901 Long term (current) use of anticoagulants: Secondary | ICD-10-CM | POA: Diagnosis not present

## 2024-03-07 DIAGNOSIS — Z7982 Long term (current) use of aspirin: Secondary | ICD-10-CM | POA: Diagnosis not present

## 2024-03-07 DIAGNOSIS — G8929 Other chronic pain: Secondary | ICD-10-CM | POA: Diagnosis not present

## 2024-03-07 DIAGNOSIS — N179 Acute kidney failure, unspecified: Secondary | ICD-10-CM | POA: Diagnosis not present

## 2024-03-07 DIAGNOSIS — E039 Hypothyroidism, unspecified: Secondary | ICD-10-CM | POA: Diagnosis not present

## 2024-03-07 DIAGNOSIS — R262 Difficulty in walking, not elsewhere classified: Secondary | ICD-10-CM | POA: Diagnosis not present

## 2024-03-07 DIAGNOSIS — R1310 Dysphagia, unspecified: Secondary | ICD-10-CM | POA: Diagnosis not present

## 2024-03-07 DIAGNOSIS — M545 Low back pain, unspecified: Secondary | ICD-10-CM | POA: Diagnosis not present

## 2024-03-07 DIAGNOSIS — I714 Abdominal aortic aneurysm, without rupture, unspecified: Secondary | ICD-10-CM | POA: Diagnosis not present

## 2024-03-07 DIAGNOSIS — N3281 Overactive bladder: Secondary | ICD-10-CM | POA: Diagnosis not present

## 2024-03-07 DIAGNOSIS — I959 Hypotension, unspecified: Secondary | ICD-10-CM | POA: Diagnosis not present

## 2024-03-07 DIAGNOSIS — Z87891 Personal history of nicotine dependence: Secondary | ICD-10-CM | POA: Diagnosis not present

## 2024-03-07 DIAGNOSIS — J9601 Acute respiratory failure with hypoxia: Secondary | ICD-10-CM | POA: Diagnosis not present

## 2024-03-07 DIAGNOSIS — W19XXXD Unspecified fall, subsequent encounter: Secondary | ICD-10-CM | POA: Diagnosis not present

## 2024-03-07 DIAGNOSIS — R945 Abnormal results of liver function studies: Secondary | ICD-10-CM | POA: Diagnosis not present

## 2024-03-07 DIAGNOSIS — I10 Essential (primary) hypertension: Secondary | ICD-10-CM | POA: Diagnosis not present

## 2024-03-07 DIAGNOSIS — R278 Other lack of coordination: Secondary | ICD-10-CM | POA: Diagnosis not present

## 2024-03-07 DIAGNOSIS — R519 Headache, unspecified: Secondary | ICD-10-CM | POA: Diagnosis not present

## 2024-03-07 DIAGNOSIS — M6259 Muscle wasting and atrophy, not elsewhere classified, multiple sites: Secondary | ICD-10-CM | POA: Diagnosis not present

## 2024-03-07 DIAGNOSIS — I82409 Acute embolism and thrombosis of unspecified deep veins of unspecified lower extremity: Secondary | ICD-10-CM | POA: Diagnosis not present

## 2024-03-07 DIAGNOSIS — Z741 Need for assistance with personal care: Secondary | ICD-10-CM | POA: Diagnosis not present

## 2024-03-07 DIAGNOSIS — M1990 Unspecified osteoarthritis, unspecified site: Secondary | ICD-10-CM | POA: Diagnosis not present

## 2024-03-07 DIAGNOSIS — E785 Hyperlipidemia, unspecified: Secondary | ICD-10-CM | POA: Diagnosis not present

## 2024-03-07 MED ORDER — APIXABAN 5 MG PO TABS
ORAL_TABLET | ORAL | 2 refills | Status: DC
  Filled 2024-03-07: qty 60, 26d supply, fill #0

## 2024-03-07 NOTE — Discharge Instructions (Addendum)
 Information on my medicine - ELIQUIS  (apixaban )  This medication education was reviewed with me or my healthcare representative as part of my discharge preparation.    Why was Eliquis  prescribed for you? Eliquis  was prescribed to treat blood clots that may have been found in the veins of your legs (deep vein thrombosis) or in your lungs (pulmonary embolism) and to reduce the risk of them occurring again.  What do You need to know about Eliquis  ? The starting dose is 10 mg (two 5 mg tablets) taken TWICE daily for the FIRST SEVEN (7) DAYS, then on  03/11/2024  the dose is reduced to ONE 5 mg tablet taken TWICE daily.  Eliquis  may be taken with or without food.   Try to take the dose about the same time in the morning and in the evening. If you have difficulty swallowing the tablet whole please discuss with your pharmacist how to take the medication safely.  Take Eliquis  exactly as prescribed and DO NOT stop taking Eliquis  without talking to the doctor who prescribed the medication.  Stopping may increase your risk of developing a new blood clot.  Refill your prescription before you run out.  After discharge, you should have regular check-up appointments with your healthcare provider that is prescribing your Eliquis .    What do you do if you miss a dose? If a dose of ELIQUIS  is not taken at the scheduled time, take it as soon as possible on the same day and twice-daily administration should be resumed. The dose should not be doubled to make up for a missed dose.  Important Safety Information A possible side effect of Eliquis  is bleeding. You should call your healthcare provider right away if you experience any of the following: Bleeding from an injury or your nose that does not stop. Unusual colored urine (red or dark brown) or unusual colored stools (red or black). Unusual bruising for unknown reasons. A serious fall or if you hit your head (even if there is no bleeding).  Some  medicines may interact with Eliquis  and might increase your risk of bleeding or clotting while on Eliquis . To help avoid this, consult your healthcare provider or pharmacist prior to using any new prescription or non-prescription medications, including herbals, vitamins, non-steroidal anti-inflammatory drugs (NSAIDs) and supplements.  This website has more information on Eliquis  (apixaban ): http://[email or web address] .com/eliquis dena

## 2024-03-07 NOTE — NC FL2 (Signed)
   MEDICAID FL2 LEVEL OF CARE FORM     IDENTIFICATION  Patient Name: JOHNATHAN TORTORELLI Birthdate: 15-Dec-1955 Sex: female Admission Date (Current Location): 02/29/2024  [facility] and [place] Number:  Producer, television/film/video and Address:  [facility] [place] [facility], [address], [place], [place] [address]      Provider Number: [id number]  Attending Physician Name and Address:  Odell Celinda Balo, MD  Relative Name and Phone Number:       Current Level of Care: Hospital Recommended Level of Care: Skilled Nursing Facility Prior Approval Number:    Date Approved/Denied:   PASRR Number: [id number]  Discharge Plan: SNF    Current Diagnoses: Patient Active Problem List   Diagnosis Date Noted   Elevated LFTs 03/01/2024   Hypoxia 03/01/2024   Headache 03/01/2024   ARF (acute renal failure) (HCC) 02/29/2024   History of stroke 02/29/2024   Hypotension 02/29/2024   Prediabetes 02/24/2023   Bilateral foot pain 08/26/2022   Overactive bladder 08/26/2022   Former smoker 08/26/2022   OA (osteoarthritis) of knee 05/22/2022   Arthritis of knee 05/22/2022   Current every day smoker 01/19/2022   Difficulty sleeping 01/19/2022   Obesity, Class III, BMI 40-49.9 (morbid obesity) 11/19/2021   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   Acute CVA (cerebrovascular accident) (HCC) 11/18/2021   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   Essential hypertension 11/16/2021   Hyperlipidemia 11/16/2021   Tobacco use 11/16/2021   Syncope and collapse 06/26/2021    Orientation RESPIRATION BLADDER Height & Weight     Self, Time, Situation, Place  O2 Continent Weight: 240 lb 4.8 oz (109 kg) Height:  5' (152.4 cm)  BEHAVIORAL SYMPTOMS/MOOD NEUROLOGICAL BOWEL NUTRITION STATUS      Continent Diet (See Discharge Summary)  AMBULATORY STATUS COMMUNICATION OF NEEDS Skin   Limited Assist Verbally Bruising                       Personal Care Assistance Level of  Assistance  Bathing, Feeding, Dressing Bathing Assistance: Limited assistance Feeding assistance: Independent Dressing Assistance: Limited assistance     Functional Limitations Info  Sight, Hearing, Speech Sight Info: Adequate Hearing Info: Adequate Speech Info: Adequate    SPECIAL CARE FACTORS FREQUENCY  PT (By licensed PT), OT (By licensed OT)     PT Frequency: 3-5 x per week OT Frequency: 3-5 x per week            Contractures      Additional Factors Info  Code Status, Allergies Code Status Info: Full Code Allergies Info: NKDA           Current Medications (03/07/2024):  This is the current hospital active medication list Current Facility-Administered Medications  Medication Dose Route Frequency Provider Last Rate Last Admin   apixaban  (ELIQUIS ) tablet 10 mg  10 mg Oral BID Odell Celinda Balo, MD   10 mg at 03/07/24 9076   Followed by   NOREEN ON 03/11/2024] apixaban  (ELIQUIS ) tablet 5 mg  5 mg Oral BID Odell Celinda Balo, MD       aspirin  chewable tablet 81 mg  81 mg Oral Daily Franky Redia SAILOR, MD   81 mg at 03/07/24 9076   gabapentin  (NEURONTIN ) capsule 600 mg  600 mg Oral QHS Regalado, Belkys A, MD   600 mg at 03/06/24 2320   levothyroxine  (SYNTHROID ) tablet 25 mcg  25 mcg Oral Q0600 Regalado, Belkys A, MD   25 mcg at 03/07/24 0615  multivitamin with minerals tablet 1 tablet  1 tablet Oral Daily Regalado, Belkys A, MD   1 tablet at 03/07/24 9076     Discharge Medications: Please see discharge summary for a list of discharge medications.  Relevant Imaging Results:  Relevant Lab Results:   Additional Information SS# [id number]  Almarie CHRISTELLA Goodie, [place]

## 2024-03-07 NOTE — Plan of Care (Signed)

## 2024-03-07 NOTE — TOC Transition Note (Signed)
 Transition of Care [facility]) - Discharge Note   Patient Details  Name: Veronica Weber MRN: [id number] Date of Birth: 1955-11-02  Transition [facility]) CM/SW Contact:  Almarie CHRISTELLA Goodie, LCSW Phone Number: 03/07/2024, 11:14 AM   Clinical Narrative:   Patient received insurance approval for SNF, and [facility] can admit today. CSW discussed with son, [name], and family in agreement. CSW discussed transportation, and family feel safer arranging [facility] due to patient's risk for falls. CSW sent discharge summary to [facility], confirmed receipt and they are ready for patient. Transport arranged with [facility] for next available.   Nurse to call report to [phone], Room 30. Nurse to please also call son, [name], once [facility] arrives.     Final next level of care: Skilled Nursing Facility Barriers to Discharge: Barriers Resolved   Patient Goals and CMS Choice            Discharge Placement              Patient [facility] bed at: [facility] Patient to be transferred to facility by: [facility] Name of family member notified: [name] Patient and family notified of of transfer: 03/07/24  Discharge Plan and Services Additional resources added to the After Visit Summary for                                       Social Drivers of Health (SDOH) Interventions SDOH Screenings   Food Insecurity: No Food Insecurity (03/02/2024)  Housing: Low Risk  (03/02/2024)  Transportation Needs: No Transportation Needs (03/02/2024)  Utilities: Not At Risk (03/02/2024)  Depression (PHQ2-9): High Risk (08/27/2023)  Financial Resource Strain: Low Risk  (02/12/2023)  Physical Activity: Sufficiently Active (02/12/2023)  Social Connections: Socially Isolated (03/02/2024)  Stress: No Stress Concern Present (02/12/2023)  Tobacco Use: Medium Risk (02/29/2024)     Readmission Risk Interventions    03/01/2024    3:22 PM  Readmission Risk Prevention Plan  Post Dischage Appt Complete  Medication  Screening Complete  Transportation Screening Complete

## 2024-03-07 NOTE — Discharge Summary (Addendum)
 Physician Discharge Summary  Veronica Weber FMW:[id number] DOB: [birth year] DOA: 02/29/2024  PCP: Kathrene Mardy HERO, PA-C  Admit date: 02/29/2024 Discharge date: 03/07/2024  Ad[place] From: [place]  Recommendations for Outpatient Follow-up:  Follow up with PCP in 1-2 weeks Please obtain BMP/CBC in one week   Home Health:No Equipment/Devices:None  Discharge Condition:Stable CODE STATUS:Full Diet recommendation: Heart Healthy   Brief/Interim Summary: 68 y.o. female past medical history of CVA, essential hypertension hyperlipidemia comes in for frequent falls and weakness that started 4 days prior to admission she fell on the floor could not get up, in the ED was noted to be hypotensive found to be in acute kidney injury with abnormal LFTs CT of the head showed no acute findings   Discharge Diagnoses:  Principal Problem:   ARF (acute renal failure) (HCC) Active Problems:   Essential hypertension   Hyperlipidemia   History of stroke   Hypotension   Elevated LFTs   Hypoxia   Headache  Acute kidney injury: With a baseline creatinine of less than 1 on admission 1.7 likely hemodynamically mediated in the setting of diuretic and ARB these were held on admission as was started on IV fluids her creatinine returned to baseline. PT evaluated the patient recommended skilled nursing facility.  Acute respiratory failure with hypoxia likely due to acute PE and DVT: Due to her renal dysfunction lower extremity Doppler was done which was positive for DVT, VQ scan showed positive for large perfusion defect. Started initially on IV heparin  then transition to oral Eliquis .  Hypotension/essential hypertension: Likely multifactorial in the setting of hypovolemia and PE, antihypertensive medications were held she was resuscitated her blood pressure improved. Will continue to hold antihypertensive medication until you see PCP.  Frequent falls: Will go to skilled  nursing facility.  Elevated Lifteez: In setting of hypotension, otitis panel negative. Abdominal ultrasound showed no common bile duct dilation she has a history of cholecystectomy.  Hypothyroidism: Continue Synthroid .  History of CVA: Resume statin and aspirin .  Diarrhea: Now resolved.  Chronic back pain: Tylenol  seems to be working for the pain.  Incidental abdominal aortic aneurysm measuring 4.2 cm: Follow-up CT in 12 months  Severe morbid obesity with a BMI greater than 40: Noted.  Discharge Instructions  Discharge Instructions     Diet - low sodium heart healthy   Complete by: As directed    Increase activity slowly   Complete by: As directed       Allergies as of 03/07/2024   No Known Allergies      Medication List     STOP taking these medications    mirabegron  ER 25 MG Tb24 tablet Commonly known as: MYRBETRIQ    multivitamin with minerals tablet       TAKE these medications    apixaban  5 MG Tabs tablet Commonly known as: ELIQUIS  Take 2 tablets (10 mg total) by mouth 2 (two) times daily for 4 days, THEN 1 tablet (5 mg total) 2 (two) times daily. Start taking on: March 07, 2024   aspirin  81 MG chewable tablet Chew 1 tablet (81 mg total) by mouth 2 (two) times daily. What changed: when to take this   atorvastatin  80 MG tablet Commonly known as: LIPITOR  TAKE 1 TABLET BY MOUTH EVERY MORNING   chlorthalidone  25 MG tablet Commonly known as: HYGROTON  TAKE 1/2 TABLET BY MOUTH EVERY MORNING   gabapentin  300 MG capsule Commonly known as: NEURONTIN  Take 2 capsules (600 mg total) by mouth at bedtime. TAKE  1 TO 2 CAPSULES BY MOUTH AT bedtime AS DIRECTED   levothyroxine  25 MCG tablet Commonly known as: SYNTHROID  TAKE 1 TABLET BY MOUTH DAILY  BEFORE BREAKFAST TAKE ON AN  EMPTY STOMACH   losartan  50 MG tablet Commonly known as: COZAAR  TAKE 1 TABLET BY MOUTH DAILY   naltrexone  50 MG tablet Commonly known as: DEPADE TAKE 1/2 TABLET BY MOUTH ONCE  DAILY What changed:  when to take this reasons to take this        No Known Allergies  Consultations: None   Procedures/Studies: DG Lumbar Spine 2-3 Views Result Date: 03/02/2024 CLINICAL DATA:  Back pain. EXAM: LUMBAR SPINE - 2-3 VIEW COMPARISON:  Abdominal radiograph dated 10/07/2021. FINDINGS: Five lumbar type vertebra. There is no acute fracture or subluxation of the lumbar spine. Mild chronic compression fracture of superior endplate of L2. Multilevel degenerative changes and spurring. Lower lumbar facet arthropathy. Atherosclerotic calcification of the abdominal aorta. There is a 4.2 cm abdominal aortic aneurysm. Right upper quadrant cholecystectomy clips. IMPRESSION: 1. No acute findings. 2. Multilevel degenerative changes. 3. A 4.2 cm abdominal aortic aneurysm. Recommend follow-up CT or MR as appropriate in 12 months and referral to or continued care with vascular specialist. (Ref.: J Vasc Surg. 2018; 67:2-77 and J Am Coll Radiol 2013;10(10):789-794.) Electronically Signed   By: Vanetta Chou M.D.   On: 03/02/2024 16:37   VAS US  LOWER EXTREMITY VENOUS (DVT) Result Date: 03/01/2024  Lower Venous DVT Study Patient Name:  Veronica Weber [facility]  Date of Exam:   03/01/2024 Medical Rec #: [id number]                Accession #:    [id number] Date of Birth: 1955/11/26                Patient Gender: F Patient Age:   68 years Exam Location:  [facility] Procedure:      VAS US  LOWER EXTREMITY VENOUS (DVT) Referring Phys: OWEN REGALADO --------------------------------------------------------------------------------  Indications: Edema, and SOB.  Limitations: Body habitus and poor ultrasound/tissue interface. Comparison Study: No previous exams Performing Technologist: Jody Hill RVT, RDMS  Examination Guidelines: A complete evaluation includes B-mode imaging, spectral Doppler, color Doppler, and power Doppler as needed of all accessible portions of each vessel. Bilateral testing is considered  an integral part of a complete examination. Limited examinations for reoccurring indications may be performed as noted. The reflux portion of the exam is performed with the patient in reverse Trendelenburg.  +---------+---------------+---------+-----------+----------+-------------------+ RIGHT    CompressibilityPhasicitySpontaneityPropertiesThrombus Aging      +---------+---------------+---------+-----------+----------+-------------------+ CFV      Full           Yes      Yes                                      +---------+---------------+---------+-----------+----------+-------------------+ SFJ      Full                                                             +---------+---------------+---------+-----------+----------+-------------------+ FV Prox  Full           Yes      Yes                                      +---------+---------------+---------+-----------+----------+-------------------+  FV Mid   Full           Yes      Yes                                      +---------+---------------+---------+-----------+----------+-------------------+ FV DistalFull           Yes      Yes                                      +---------+---------------+---------+-----------+----------+-------------------+ PFV      Full                                                             +---------+---------------+---------+-----------+----------+-------------------+ POP      Full           Yes      Yes                                      +---------+---------------+---------+-----------+----------+-------------------+ PTV      Full                                         Not well visualized +---------+---------------+---------+-----------+----------+-------------------+ PERO     Full                                         Not well visualized +---------+---------------+---------+-----------+----------+-------------------+    +--------+---------------+---------+-----------+----------+--------------------+ LEFT    CompressibilityPhasicitySpontaneityPropertiesThrombus Aging       +--------+---------------+---------+-----------+----------+--------------------+ CFV     Full           Yes      Yes                                       +--------+---------------+---------+-----------+----------+--------------------+ SFJ     Full                                                              +--------+---------------+---------+-----------+----------+--------------------+ FV Prox Full           Yes      Yes                                       +--------+---------------+---------+-----------+----------+--------------------+ FV Mid  Full           Yes      Yes                                       +--------+---------------+---------+-----------+----------+--------------------+  FV                     Yes      Yes                  patent by            Distal                                               color/doppler        +--------+---------------+---------+-----------+----------+--------------------+ PFV     Full                                                              +--------+---------------+---------+-----------+----------+--------------------+ POP     None           No       No                   Acute                +--------+---------------+---------+-----------+----------+--------------------+ PTV     None           No       No                   Acute                +--------+---------------+---------+-----------+----------+--------------------+ PERO    Full                                                              +--------+---------------+---------+-----------+----------+--------------------+     Summary: BILATERAL: -No evidence of popliteal cyst, bilaterally. RIGHT: - There is no evidence of deep vein thrombosis in the lower extremity.  LEFT: - Findings  consistent with acute deep vein thrombosis involving the left popliteal vein, and left posterior tibial veins.   *See table(s) above for measurements and observations. Electronically signed by Debby Robertson on 03/01/2024 at 9:18:51 PM.    Final    ECHOCARDIOGRAM COMPLETE Result Date: 03/01/2024    ECHOCARDIOGRAM REPORT   Patient Name:   Veronica Weber Date of Exam: 03/01/2024 Medical Rec #:  [id number]               Height:       60.0 in Accession #:    [id number]              Weight:       241.0 lb Date of Birth:  [birth year]               BSA:          2.021 m Patient Age:    68 years                BP:           118/82 mmHg Patient Gender: F  HR:           100 bpm. Exam Location:  Inpatient Procedure: 2D Echo, Cardiac Doppler, Color Doppler and Intracardiac            Opacification Agent (Both Spectral and Color Flow Doppler were            utilized during procedure). Indications:    Syncope  History:        Patient has prior history of Echocardiogram examinations, most                 recent 11/17/2021.  Sonographer:    Therisa Crouch Referring Phys: [phone] ARSHAD N KAKRAKANDY IMPRESSIONS  1. Left ventricular ejection fraction, by estimation, is 60 to 65%. The left ventricle has normal function. The left ventricle has no regional wall motion abnormalities. Left ventricular diastolic parameters are consistent with Grade I diastolic dysfunction (impaired relaxation). There is the interventricular septum is flattened in systole, consistent with right ventricular pressure overload.  2. Although overall right ventricular function appears normal, there is relative hypocontractility of the free wall, compared to the apex (McConnell's sign). Right ventricular systolic function is normal. The right ventricular size is mildly enlarged. There is moderately elevated pulmonary artery systolic pressure. The estimated right ventricular systolic pressure is 49.1 mmHg.  3. A small pericardial effusion is  present. The pericardial effusion is circumferential.  4. The mitral valve is normal in structure. No evidence of mitral valve regurgitation. No evidence of mitral stenosis.  5. Tricuspid valve regurgitation is mild to moderate.  6. The aortic valve is tricuspid. There is mild calcification of the aortic valve. Aortic valve regurgitation is not visualized. Aortic valve sclerosis is present, with no evidence of aortic valve stenosis.  7. The inferior vena cava is dilated in size with <50% respiratory variability, suggesting right atrial pressure of 15 mmHg. Conclusion(s)/Recommendation(s): Findings consistent with Cor Pulmonale. (e.g. pulmonary embolism) FINDINGS  Left Ventricle: Left ventricular ejection fraction, by estimation, is 60 to 65%. The left ventricle has normal function. The left ventricle has no regional wall motion abnormalities. The left ventricular internal cavity size was normal in size. There is  no left ventricular hypertrophy. The interventricular septum is flattened in systole, consistent with right ventricular pressure overload. Left ventricular diastolic parameters are consistent with Grade I diastolic dysfunction (impaired relaxation). Normal left ventricular filling pressure. Right Ventricle: Although overall right ventricular function appears normal, there is relative hypocontractility of the free wall, compared to the apex (McConnell's sign). The right ventricular size is mildly enlarged. No increase in right ventricular wall thickness. Right ventricular systolic function is normal. There is moderately elevated pulmonary artery systolic pressure. The tricuspid regurgitant velocity is 2.92 m/s, and with an assumed right atrial pressure of 15 mmHg, the estimated right ventricular systolic pressure is 49.1 mmHg. Left Atrium: Left atrial size was normal in size. Right Atrium: Right atrial size was normal in size. Pericardium: A small pericardial effusion is present. The pericardial effusion is  circumferential. Mitral Valve: The mitral valve is normal in structure. Mild mitral annular calcification. No evidence of mitral valve regurgitation. No evidence of mitral valve stenosis. Tricuspid Valve: The tricuspid valve is normal in structure. Tricuspid valve regurgitation is mild to moderate. Aortic Valve: The aortic valve is tricuspid. There is mild calcification of the aortic valve. Aortic valve regurgitation is not visualized. Aortic valve sclerosis is present, with no evidence of aortic valve stenosis. Pulmonic Valve: The pulmonic valve was not well visualized. Pulmonic valve regurgitation is  trivial. No evidence of pulmonic stenosis. Aorta: The aortic root and ascending aorta are structurally normal, with no evidence of dilitation. Venous: The inferior vena cava is dilated in size with less than 50% respiratory variability, suggesting right atrial pressure of 15 mmHg. IAS/Shunts: The interatrial septum was not well visualized.  LEFT VENTRICLE PLAX 2D LVIDd:         3.70 cm   Diastology LVIDs:         2.50 cm   LV e' medial:    9.36 cm/s LV PW:         1.20 cm   LV E/e' medial:  6.0 LV IVS:        1.10 cm   LV e' lateral:   9.79 cm/s LVOT diam:     2.00 cm   LV E/e' lateral: 5.8 LVOT Area:     3.14 cm  LEFT ATRIUM             Index LA diam:        2.80 cm 1.39 cm/m LA Vol (A2C):   32.4 ml 16.04 ml/m LA Vol (A4C):   41.0 ml 20.29 ml/m LA Biplane Vol: 36.6 ml 18.11 ml/m   AORTA Ao Root diam: 3.20 cm Ao Asc diam:  3.30 cm MITRAL VALVE               TRICUSPID VALVE MV Area (PHT): 4.06 cm    TR Peak grad:   34.1 mmHg MV Decel Time: 187 msec    TR Vmax:        292.00 cm/s MV E velocity: 56.30 cm/s MV A velocity: 91.80 cm/s  SHUNTS MV E/A ratio:  0.61        Systemic Diam: 2.00 cm Jerel Croitoru MD Electronically signed by Jerel Balding MD Signature Date/Time: 03/01/2024/2:47:50 PM    Final    NM Pulmonary Perfusion Result Date: 03/01/2024 CLINICAL DATA:  Hypoxemia.  Concern for pulmonary embolism. EXAM:  NUCLEAR MEDICINE PERFUSION LUNG SCAN TECHNIQUE: Perfusion images were obtained in multiple projections after intravenous injection of radiopharmaceutical. No ventilation imaging performed. RADIOPHARMACEUTICALS:  4.4 mCi Tc-82m MAA IV COMPARISON:  Chest radiographs 02/29/2024. Noncontrast chest CT 08/11/2024. FINDINGS: There is a large peripheral wedge shaped perfusion defect in the right lung without corresponding abnormality on the recent chest radiographs. No suspicious perfusion defects within the left lung. IMPRESSION: Large right lung perfusion defect, highly suspicious for pulmonary embolism (pulmonary embolism present by PISAPED criteria). Critical Value/emergent results were called by telephone at the time of interpretation on 03/01/2024 at 2:07 pm to provider BELKYS REGALADO , who verbally acknowledged these results. In discussion with Dr. Madelyne, the patient also had recent lower extremity venous Doppler ultrasound positive for DVT. Electronically Signed   By: Elsie Perone M.D.   On: 03/01/2024 14:09   US  Abdomen Limited RUQ (LIVER/GB) Result Date: 03/01/2024 CLINICAL DATA:  68 year old female with abnormal LFTs. Prior cholecystectomy. EXAM: ULTRASOUND ABDOMEN LIMITED RIGHT UPPER QUADRANT COMPARISON:  Chest CT 08/11/2022.  Ultrasound 09/17/2023. FINDINGS: Gallbladder: Surgically absent. Common bile duct: Diameter: 8-9 mm (previously 7-8 mm). No filling defect within the visible duct. Liver: No intrahepatic biliary ductal dilatation is identified. Echogenic liver redemonstrated (image 23). No discrete liver lesion. Portal vein is patent on color Doppler imaging with normal direction of blood flow towards the liver. Other: Negative visible right kidney.  No free fluid. IMPRESSION: 1. Cholecystectomy with mildly prominent CBD, 1 mm larger since January. No intrahepatic ductal dilatation to strongly suggest acute biliary  obstruction. 2. Hepatic Steatosis. Electronically Signed   By: VEAR Hurst M.D.   On:  03/01/2024 05:29   CT HEAD WO CONTRAST ( ) Result Date: 02/29/2024 CLINICAL DATA:  Mental status change, confusion, dizziness. EXAM: CT HEAD WITHOUT CONTRAST TECHNIQUE: Contiguous axial images were obtained from the base of the skull through the vertex without intravenous contrast. RADIATION DOSE REDUCTION: This exam was performed according to the departmental dose-optimization program which includes automated exposure control, adjustment of the mA and/or kV according to patient size and/or use of iterative reconstruction technique. COMPARISON:  MRI head 11/17/2021. FINDINGS: Brain: No acute intracranial hemorrhage. No CT evidence of acute infarct. Nonspecific hypoattenuation in the periventricular and subcortical white matter favored to reflect chronic microvascular ischemic changes. Remote infarcts in the bilateral parietal lobes. Generalized parenchymal volume loss. No edema, mass effect, or midline shift. The basilar cisterns are patent. Ventricles: The ventricles are normal. Vascular: Atherosclerotic calcifications of the carotid siphons and intracranial vertebral arteries. No hyperdense vessel. Skull: No acute or aggressive finding. Orbits: Bilateral lens replacement. Sinuses: Mild mucosal thickening in the left maxillary sinus. Other: Mastoid air cells are clear. IMPRESSION: No acute intracranial abnormality. Extensive chronic microvascular ischemic changes and mild parenchymal volume loss. Remote infarcts in the bilateral parietal lobes. Electronically Signed   By: Donnice Mania M.D.   On: 02/29/2024 16:52   DG Chest 2 View Result Date: 02/29/2024 CLINICAL DATA:  Shortness of breath EXAM: CHEST - 2 VIEW COMPARISON:  CT 08/11/2022 FINDINGS: Heart and mediastinal contours are within normal limits. No focal opacities or effusions. No acute bony abnormality. IMPRESSION: No active cardiopulmonary disease. Electronically Signed   By: Franky Crease M.D.   On: 02/29/2024 15:01     Subjective: No  complaints  Discharge Exam: Vitals:   03/06/24 2300 03/07/24 0600  BP: 121/71 106/65  Pulse: 88   Resp:  14  Temp: 98.1 F (36.7 C) 97.8 F (36.6 C)  SpO2: 96% 95%   Vitals:   03/06/24 1239 03/06/24 1641 03/06/24 2300 03/07/24 0600  BP: 103/68 128/81 121/71 106/65  Pulse: 98 91 88   Resp:    14  Temp: 97.8 F (36.6 C) 97.8 F (36.6 C) 98.1 F (36.7 C) 97.8 F (36.6 C)  TempSrc: Oral Oral Oral Oral  SpO2: 97% 97% 96% 95%  Weight:      Height:        General: Pt is alert, awake, not in acute distress Cardiovascular: RRR, S1/S2 +, no rubs, no gallops Respiratory: CTA bilaterally, no wheezing, no rhonchi Abdominal: Soft, NT, ND, bowel sounds + Extremities: no edema, no cyanosis    The results of significant diagnostics from this hospitalization (including imaging, microbiology, ancillary and laboratory) are listed below for reference.     Microbiology: No results found for this or any previous visit (from the past 240 hours).   Labs: BNP (last 3 results) No results for input(s): BNP in the last 8760 hours. Basic Metabolic Panel: Recent Labs  Lab 02/29/24 1407 03/01/24 0542 03/02/24 0403 03/03/24 0243 03/05/24 0523  NA 136 139 139 138 140  K 3.6 3.4* 4.0 4.3 4.3  CL 100 106 105 106 108  CO2 25 22 25 26 27   GLUCOSE 130* 99 106* 108* 108*  BUN 59* 45* 30* 27* 16  CREATININE 1.79* 1.20* 1.07* 1.17* 0.90  CALCIUM  9.3 8.7* 8.6* 9.0 9.4   Liver Function Tests: Recent Labs  Lab 02/29/24 1626 03/01/24 0542 03/02/24 0827 03/03/24 0243  AST 122* 104* 93*  87*  ALT 79* 70* 62* 60*  ALKPHOS 102 89 86 87  BILITOT 1.0 0.9 0.8 0.5  PROT 6.8 5.9* 5.6* 5.4*  ALBUMIN  2.9* 2.5* 2.4* 2.2*   No results for input(s): LIPASE, AMYLASE in the last 168 hours. No results for input(s): AMMONIA in the last 168 hours. CBC: Recent Labs  Lab 03/01/24 0542 03/02/24 0403 03/03/24 0243 03/04/24 0220 03/05/24 0523  WBC 9.8 9.3 11.3* 9.6 7.8  HGB 12.2 11.4* 10.8*  11.4* 11.3*  HCT 37.3 35.3* 34.1* 35.5* 35.1*  MCV 91.4 93.1 92.9 92.7 93.4  PLT 161 198 232 250 271   Cardiac Enzymes: Recent Labs  Lab 03/01/24 0542  CKTOTAL 333*   BNP: Invalid input(s): POCBNP CBG: Recent Labs  Lab 03/01/24 2029  GLUCAP 132*   D-Dimer No results for input(s): DDIMER in the last 72 hours. Hgb A1c No results for input(s): HGBA1C in the last 72 hours. Lipid Profile No results for input(s): CHOL, HDL, LDLCALC, TRIG, CHOLHDL, LDLDIRECT in the last 72 hours. Thyroid  function studies No results for input(s): TSH, T4TOTAL, T3FREE, THYROIDAB in the last 72 hours.  Invalid input(s): FREET3 Anemia work up No results for input(s): VITAMINB12, FOLATE, FERRITIN, TIBC, IRON, RETICCTPCT in the last 72 hours. Urinalysis    Component Value Date/Time   COLORURINE YELLOW 11/18/2021 0100   APPEARANCEUR CLEAR 11/18/2021 0100   LABSPEC 1.016 11/18/2021 0100   PHURINE 5.0 11/18/2021 0100   GLUCOSEU NEGATIVE 11/18/2021 0100   HGBUR NEGATIVE 11/18/2021 0100   BILIRUBINUR NEGATIVE 11/18/2021 0100   BILIRUBINUR negative 10/15/2020 1151   KETONESUR NEGATIVE 11/18/2021 0100   PROTEINUR NEGATIVE 11/18/2021 0100   UROBILINOGEN 0.2 10/15/2020 1151   NITRITE NEGATIVE 11/18/2021 0100   LEUKOCYTESUR NEGATIVE 11/18/2021 0100   Sepsis Labs Recent Labs  Lab 03/02/24 0403 03/03/24 0243 03/04/24 0220 03/05/24 0523  WBC 9.3 11.3* 9.6 7.8   Microbiology No results found for this or any previous visit (from the past 240 hours).  SIGNED:   Erle Odell Castor, MD  [facility] 03/07/2024, 8:04 AM Pager   If 7PM-7AM, please contact night-coverage [email or web address] Password [email or web address]

## 2024-03-07 NOTE — Progress Notes (Signed)
 Report given to Sari at [facility]

## 2024-03-09 DIAGNOSIS — N179 Acute kidney failure, unspecified: Secondary | ICD-10-CM | POA: Diagnosis not present

## 2024-03-09 DIAGNOSIS — E559 Vitamin D deficiency, unspecified: Secondary | ICD-10-CM | POA: Diagnosis not present

## 2024-03-09 DIAGNOSIS — G8929 Other chronic pain: Secondary | ICD-10-CM | POA: Diagnosis not present

## 2024-03-09 DIAGNOSIS — R7989 Other specified abnormal findings of blood chemistry: Secondary | ICD-10-CM | POA: Diagnosis not present

## 2024-03-09 DIAGNOSIS — M549 Dorsalgia, unspecified: Secondary | ICD-10-CM | POA: Diagnosis not present

## 2024-03-09 DIAGNOSIS — I2699 Other pulmonary embolism without acute cor pulmonale: Secondary | ICD-10-CM | POA: Diagnosis not present

## 2024-03-09 DIAGNOSIS — I82409 Acute embolism and thrombosis of unspecified deep veins of unspecified lower extremity: Secondary | ICD-10-CM | POA: Diagnosis not present

## 2024-03-09 DIAGNOSIS — R5381 Other malaise: Secondary | ICD-10-CM | POA: Diagnosis not present

## 2024-03-09 DIAGNOSIS — I10 Essential (primary) hypertension: Secondary | ICD-10-CM | POA: Diagnosis not present

## 2024-03-09 DIAGNOSIS — I714 Abdominal aortic aneurysm, without rupture, unspecified: Secondary | ICD-10-CM | POA: Diagnosis not present

## 2024-03-10 DIAGNOSIS — I714 Abdominal aortic aneurysm, without rupture, unspecified: Secondary | ICD-10-CM | POA: Diagnosis not present

## 2024-03-10 DIAGNOSIS — R7989 Other specified abnormal findings of blood chemistry: Secondary | ICD-10-CM | POA: Diagnosis not present

## 2024-03-10 DIAGNOSIS — E559 Vitamin D deficiency, unspecified: Secondary | ICD-10-CM | POA: Diagnosis not present

## 2024-03-10 DIAGNOSIS — R5381 Other malaise: Secondary | ICD-10-CM | POA: Diagnosis not present

## 2024-03-10 DIAGNOSIS — I10 Essential (primary) hypertension: Secondary | ICD-10-CM | POA: Diagnosis not present

## 2024-03-10 DIAGNOSIS — N179 Acute kidney failure, unspecified: Secondary | ICD-10-CM | POA: Diagnosis not present

## 2024-03-10 DIAGNOSIS — I2699 Other pulmonary embolism without acute cor pulmonale: Secondary | ICD-10-CM | POA: Diagnosis not present

## 2024-03-10 DIAGNOSIS — I82409 Acute embolism and thrombosis of unspecified deep veins of unspecified lower extremity: Secondary | ICD-10-CM | POA: Diagnosis not present

## 2024-03-10 DIAGNOSIS — M549 Dorsalgia, unspecified: Secondary | ICD-10-CM | POA: Diagnosis not present

## 2024-03-10 DIAGNOSIS — G8929 Other chronic pain: Secondary | ICD-10-CM | POA: Diagnosis not present

## 2024-03-14 DIAGNOSIS — R7989 Other specified abnormal findings of blood chemistry: Secondary | ICD-10-CM | POA: Diagnosis not present

## 2024-03-14 DIAGNOSIS — R5381 Other malaise: Secondary | ICD-10-CM | POA: Diagnosis not present

## 2024-03-14 DIAGNOSIS — I10 Essential (primary) hypertension: Secondary | ICD-10-CM | POA: Diagnosis not present

## 2024-03-14 DIAGNOSIS — G8929 Other chronic pain: Secondary | ICD-10-CM | POA: Diagnosis not present

## 2024-03-14 DIAGNOSIS — R600 Localized edema: Secondary | ICD-10-CM | POA: Diagnosis not present

## 2024-03-14 DIAGNOSIS — I714 Abdominal aortic aneurysm, without rupture, unspecified: Secondary | ICD-10-CM | POA: Diagnosis not present

## 2024-03-14 DIAGNOSIS — I82409 Acute embolism and thrombosis of unspecified deep veins of unspecified lower extremity: Secondary | ICD-10-CM | POA: Diagnosis not present

## 2024-03-14 DIAGNOSIS — E559 Vitamin D deficiency, unspecified: Secondary | ICD-10-CM | POA: Diagnosis not present

## 2024-03-14 DIAGNOSIS — R300 Dysuria: Secondary | ICD-10-CM | POA: Diagnosis not present

## 2024-03-16 DIAGNOSIS — N390 Urinary tract infection, site not specified: Secondary | ICD-10-CM | POA: Diagnosis not present

## 2024-03-21 ENCOUNTER — Telehealth: Payer: Self-pay

## 2024-03-21 NOTE — Transitions of Care (Post Inpatient/ED Visit) (Unsigned)
   03/21/2024  Name: Veronica Weber MRN: [id number] DOB: May 28, 1956  Today's TOC FU Call Status: Today's TOC FU Call Status:: Unsuccessful Call (1st Attempt) Unsuccessful Call (1st Attempt) Date: 03/21/24  Attempted to reach the patient regarding the most recent Inpatient/ED visit.  Follow Up Plan: Additional outreach attempts will be made to reach the patient to complete the Transitions of Care (Post Inpatient/ED visit) call.   Signature Avelina Essex, CMA (AAMA)  [facility] [phone]

## 2024-03-30 ENCOUNTER — Other Ambulatory Visit: Payer: Self-pay | Admitting: Physician Assistant

## 2024-03-30 NOTE — Telephone Encounter (Signed)
 Please see notes on paused medication being requested and advise on refill

## 2024-03-30 NOTE — Telephone Encounter (Unsigned)
 Copied from CRM [id number]. Topic: Clinical - Medication Refill >> Mar 30, 2024  9:26 AM [place] A wrote: Medication: chlorthalidone  (HYGROTON ) 25 MG tablet  Has the patient contacted their pharmacy? Yes (Agent: If no, request that the patient contact the pharmacy for the refill. If patient does not wish to contact the pharmacy document the reason why and proceed with request.) (Agent: If yes, when and what did the pharmacy advise?)  This is the patient's preferred pharmacy:   [facility] [place]  - [place], [place] - [address] [place] [address] Phone: [phone] Fax: [phone]    Is this the correct pharmacy for this prescription? Yes If no, delete pharmacy and type the correct one.   Has the prescription been filled recently? No  Is the patient out of the medication? No  Has the patient been seen for an appointment in the last year OR does the patient have an upcoming appointment? Yes  Can we respond through MyChart? Yes  Agent: Please be advised that Rx refills may take up to 3 business days. We ask that you follow-up with your pharmacy.

## 2024-04-25 ENCOUNTER — Other Ambulatory Visit (HOSPITAL_COMMUNITY): Payer: Self-pay

## 2024-04-25 ENCOUNTER — Encounter: Payer: Self-pay | Admitting: Physician Assistant

## 2024-04-25 ENCOUNTER — Ambulatory Visit (INDEPENDENT_AMBULATORY_CARE_PROVIDER_SITE_OTHER): Admitting: Physician Assistant

## 2024-04-25 VITALS — BP 108/70 | HR 97 | Temp 97.0°F | Ht 60.0 in | Wt 233.8 lb

## 2024-04-25 DIAGNOSIS — Z5986 Financial insecurity: Secondary | ICD-10-CM | POA: Diagnosis not present

## 2024-04-25 DIAGNOSIS — I825Z2 Chronic embolism and thrombosis of unspecified deep veins of left distal lower extremity: Secondary | ICD-10-CM | POA: Diagnosis not present

## 2024-04-25 DIAGNOSIS — D649 Anemia, unspecified: Secondary | ICD-10-CM | POA: Diagnosis not present

## 2024-04-25 DIAGNOSIS — I10 Essential (primary) hypertension: Secondary | ICD-10-CM | POA: Diagnosis not present

## 2024-04-25 DIAGNOSIS — R296 Repeated falls: Secondary | ICD-10-CM | POA: Diagnosis not present

## 2024-04-25 DIAGNOSIS — E039 Hypothyroidism, unspecified: Secondary | ICD-10-CM

## 2024-04-25 DIAGNOSIS — I63511 Cerebral infarction due to unspecified occlusion or stenosis of right middle cerebral artery: Secondary | ICD-10-CM

## 2024-04-25 DIAGNOSIS — I714 Abdominal aortic aneurysm, without rupture, unspecified: Secondary | ICD-10-CM | POA: Diagnosis not present

## 2024-04-25 DIAGNOSIS — Z86711 Personal history of pulmonary embolism: Secondary | ICD-10-CM | POA: Diagnosis not present

## 2024-04-25 MED ORDER — APIXABAN 5 MG PO TABS: 5.0000 mg | ORAL_TABLET | Freq: Two times a day (BID) | ORAL | 5 refills | Status: DC

## 2024-04-25 NOTE — Progress Notes (Signed)
 Patient ID: Veronica Weber, female    DOB: 07-26-1956, 68 y.o.   MRN: [id number]   Assessment & Plan:  Abdominal aortic aneurysm (AAA) 3.0 cm to 5.0 cm in diameter in female [facility]) -     Ambulatory referral to Vascular Surgery  Recurrent falls -     CBC with Differential/Platelet -     Comprehensive metabolic panel with GFR -     TSH -     IBC + Ferritin  Hypothyroidism, unspecified type -     TSH  Low hemoglobin -     CBC with Differential/Platelet -     IBC + Ferritin  Essential hypertension -     Comprehensive metabolic panel with GFR  Right middle cerebral artery stroke (HCC)  Lower leg DVT (deep venous thromboembolism), chronic, left (HCC)  History of pulmonary embolus (PE)  Other orders -     Apixaban ; Take 1 tablet (5 mg total) by mouth 2 (two) times daily.  Dispense: 60 tablet; Refill: 5      Assessment and Plan Assessment & Plan Acute left lower extremity deep vein thrombosis and pulmonary embolism Acute DVT involving the left popliteal vein and left posterior tibial vein, with a likely pulmonary embolism. Initially treated with IV heparin  and transitioned to oral Eliquis . Considering lifelong anticoagulation due to history of stroke and potential for recurrent thromboembolic events. Discussed the necessity of Eliquis  and aspirin , explaining her different mechanisms. Evaluating the need for lifelong anticoagulation therapy, considering the possibility of reducing the dose after six months. - Continue Eliquis  5 mg twice daily for six months - Evaluate the need for lifelong anticoagulation therapy at a future visit - Ensure affordability of Eliquis   Abdominal aortic aneurysm, 4.2 cm, without rupture Incidental finding of a 4.2 cm abdominal aortic aneurysm. Requires monitoring to prevent complications. - Refer to vein and vascular specialist for evaluation and monitoring - Schedule follow-up scan in 6 to 12 months to monitor aneurysm  size  Hypothyroidism TSH level elevated at 9.6, indicating hypothyroidism. She has not been consistently taking thyroid  medication due to lack of refills. - Prescribe thyroid  medication - Check blood work to determine appropriate thyroid  medication dosage  Hypertension Blood pressure is trending low, similar to levels during hospitalization. She has been on losartan  and chlorthalidone , but there is concern about hypotension and falls. Discussed reducing blood pressure medication to prevent further falls. - Discontinue chlorthalidone  - Reduce losartan  to 25 mg daily - Monitor blood pressure closely - Follow up in two weeks to reassess blood pressure management  History of frequent falls Frequent falls prior to hospitalization, likely related to hypotension and weakness. She has been working on balance and strength in physical therapy. - Monitor blood pressure to prevent falls - Encourage continued use of walker if needed for stability      Return in about 2 weeks (around 05/09/2024) for recheck/follow-up, blood pressure check.    Subjective:    Chief Complaint  Patient presents with   Hospitalization Follow-up    Pt in office since being inpatient in June and requesting refills of Eliquis ; pt was called on 03/21/24 for TOC but pt unreachable due to being in rehab for 2 weeks after hosp. Discharge; pt admits to falling twice at home previous to inpatient stay; pt states having food insecurity and provided with resources using [employer].    HPI Discussed the use of AI scribe software for clinical note transcription with the patient, who gave verbal consent to proceed.  History of Present Illness Veronica Weber is a 68 year old female with acute renal failure and DVT who presents for a very late hospital follow-up and medication refills.  She was admitted to [facility] from February 29, 2024, to March 07, 2024, for acute renal failure. She experienced weakness and  frequent falls four days prior to admission, culminating in a fall where she was unable to get up. In the emergency department, she was found to be hypotensive with acute kidney injury and abnormal liver function tests. A CT of the head was normal.  During her hospital stay, a lower extremity Doppler was positive for DVT, and a VQ scan showed a large perfusion defect. She was started on IV heparin  and transitioned to oral Eliquis . She was advised to hold antihypertensive medications until follow-up due to frequent falls.  She has been walking without her walker since being discharged from the nursing home, where she stayed for two weeks following her hospital discharge. She participated in physical therapy, which included balance exercises and cognitive tasks. She feels better and has lost almost eight pounds since leaving the nursing home.  She is currently taking losartan  daily and another blood pressure medication at half a pill. Her blood pressure was monitored regularly in the nursing home and was stable. However, she sometimes forgets to take her medications, which she believes contributed to her falls and feeling unwell.  She ran out of her thyroid  medication two weeks ago and has not been taking it regularly. She recalls taking it for about three weeks while in the nursing home. Her TSH was noted to be elevated at 9.6 previously.  She had diarrhea for six days before her hospital admission, which she managed with Imodium. She also has a history of a mild stroke affecting her left side, but no prior history of blood clots.  She discusses financial difficulties, including managing credit card debt and limited income, which impacts her ability to afford medications and other expenses.     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   Procedure: LEFT TOTAL KNEE ARTHROPLASTY;  Surgeon: Addie Cordella Hamilton, MD;  Location: [facility] OR;  Service: Orthopedics;  Laterality: Left;   TUBAL LIGATION  1994    Family History  Problem Relation Age of Onset   Arthritis Mother    Cancer Maternal Grandmother    Diabetes Maternal Grandmother    Diabetes Maternal Grandfather    Healthy Daughter    Healthy Son     Social History   Tobacco Use   Smoking status: Former    Current packs/day: 0.00    Average packs/day: 1 pack/day for 46.0 years (46.0 ttl pk-yrs)    Types: Cigarettes    Start date: 06/01/1976    Quit date: 05/15/2022    Years since quitting: 1.9   Smokeless tobacco:  Never  Vaping Use   Vaping status: Never Used  Substance Use Topics   Alcohol use: Not Currently   Drug use: Never     No Known Allergies  Review of Systems NEGATIVE UNLESS OTHERWISE INDICATED IN HPI      Objective:     BP 108/70 (BP Location: Left Arm, Patient Position: Sitting, Cuff Size: Large)   Pulse 97   Temp (!) 97 F (36.1 C) (Temporal)   Ht 5' (1.524 m)   Wt 233 lb 12.8 oz (106.1 kg)   SpO2 98%   BMI 45.66 kg/m   Wt Readings from Last 3 Encounters:  04/25/24 233 lb 12.8 oz (106.1 kg)  03/01/24 240 lb 4.8 oz (109 kg)  12/29/23 241 lb (109.3 kg)    BP Readings from Last 3 Encounters:  04/25/24 108/70  03/07/24 115/67  12/29/23 106/70     Physical Exam Vitals and nursing note reviewed.  Constitutional:      Appearance: Normal appearance. She is obese. She is not toxic-appearing.  HENT:     Head: Normocephalic and atraumatic.     Right Ear: External ear normal.     Left Ear: External ear  normal.  Eyes:     Extraocular Movements: Extraocular movements intact.     Conjunctiva/sclera: Conjunctivae normal.     Pupils: Pupils are equal, round, and reactive to light.  Cardiovascular:     Rate and Rhythm: Normal rate and regular rhythm.     Pulses: Normal pulses.     Heart sounds: Normal heart sounds.  Pulmonary:     Effort: Pulmonary effort is normal.     Breath sounds: Normal breath sounds.  Musculoskeletal:        General: Normal range of motion.     Cervical back: Normal range of motion and neck supple.  Skin:    General: Skin is warm and dry.  Neurological:     General: No focal deficit present.     Mental Status: She is alert and oriented to person, place, and time.  Psychiatric:        Mood and Affect: Mood normal.        Behavior: Behavior normal.        Thought Content: Thought content normal.        Judgment: Judgment normal.         Time Spent: 53 minutes of total time was spent on the date of the encounter performing the following actions: chart review prior to seeing the patient, obtaining history, performing a medically necessary exam, counseling on the treatment plan, placing orders, and documenting in our EHR.      Kizer Nobbe M Lashawn Orrego, PA-C

## 2024-04-25 NOTE — Patient Instructions (Signed)
  VISIT SUMMARY: You had a follow-up visit to discuss your recent hospital stay for acute renal failure and DVT, and to address your medication refills. We reviewed your current health status, including your blood pressure, thyroid  function, and the need for ongoing anticoagulation therapy.  YOUR PLAN: ACUTE LEFT LOWER EXTREMITY DEEP VEIN THROMBOSIS AND PULMONARY EMBOLISM: You have a blood clot in your left leg and possibly a pulmonary embolism. You were treated with IV heparin  in the hospital and are now on Eliquis . -Continue taking Eliquis  5 mg twice daily for six months. -We will evaluate the need for lifelong anticoagulation therapy at a future visit. -Ensure you can afford Eliquis ; let us  know if you have any issues.  ABDOMINAL AORTIC ANEURYSM, 4.2 CM, WITHOUT RUPTURE: You have a 4.2 cm aneurysm in your abdominal aorta that needs monitoring. -You will be referred to a vein and vascular specialist for evaluation and monitoring. -Schedule a follow-up scan in 6 to 12 months to monitor the aneurysm size.  HYPOTHYROIDISM: Your TSH level is elevated, indicating hypothyroidism. You have not been taking your thyroid  medication regularly. -We will prescribe thyroid  medication. -We will check your blood work to determine the appropriate dosage.  HYPERTENSION: Your blood pressure has been low, similar to levels during your hospitalization. This may be contributing to your falls. -Discontinue chlorthalidone . -Reduce losartan  to 25 mg daily. -Monitor your blood pressure closely. -Follow up in two weeks to reassess your blood pressure management.  HISTORY OF FREQUENT FALLS: You have had frequent falls, likely related to low blood pressure and weakness. You have been working on balance and strength in physical therapy. -Monitor your blood pressure to prevent falls. -Continue using your walker if needed for stability.                      Contains text generated by Abridge.                                  Contains text generated by Abridge.

## 2024-04-26 ENCOUNTER — Encounter: Payer: Self-pay | Admitting: Pharmacist

## 2024-04-26 LAB — CBC WITH DIFFERENTIAL/PLATELET
Basophils Absolute: 0 K/uL (ref 0.0–0.1)
Basophils Relative: 0.3 % (ref 0.0–3.0)
Eosinophils Absolute: 0.2 K/uL (ref 0.0–0.7)
Eosinophils Relative: 3.1 % (ref 0.0–5.0)
HCT: 40.2 % (ref 36.0–46.0)
Hemoglobin: 13.2 g/dL (ref 12.0–15.0)
Lymphocytes Relative: 43.1 % (ref 12.0–46.0)
Lymphs Abs: 2.6 K/uL (ref 0.7–4.0)
MCHC: 32.8 g/dL (ref 30.0–36.0)
MCV: 91.5 fl (ref 78.0–100.0)
Monocytes Absolute: 0.4 K/uL (ref 0.1–1.0)
Monocytes Relative: 7.2 % (ref 3.0–12.0)
Neutro Abs: 2.8 K/uL (ref 1.4–7.7)
Neutrophils Relative %: 46.3 % (ref 43.0–77.0)
Platelets: 316 K/uL (ref 150.0–400.0)
RBC: 4.39 Mil/uL (ref 3.87–5.11)
RDW: 14.9 % (ref 11.5–15.5)
WBC: 6 K/uL (ref 4.0–10.5)

## 2024-04-26 LAB — COMPREHENSIVE METABOLIC PANEL WITH GFR
ALT: 45 U/L — ABNORMAL HIGH (ref 0–35)
AST: 61 U/L — ABNORMAL HIGH (ref 0–37)
Albumin: 4 g/dL (ref 3.5–5.2)
Alkaline Phosphatase: 91 U/L (ref 39–117)
BUN: 16 mg/dL (ref 6–23)
CO2: 31 meq/L (ref 19–32)
Calcium: 10.4 mg/dL (ref 8.4–10.5)
Chloride: 101 meq/L (ref 96–112)
Creatinine, Ser: 0.97 mg/dL (ref 0.40–1.20)
GFR: 60.16 mL/min (ref >60.00)
Glucose, Bld: 85 mg/dL (ref 70–99)
Potassium: 3.8 meq/L (ref 3.5–5.1)
Sodium: 143 meq/L (ref 135–145)
Total Bilirubin: 0.6 mg/dL (ref 0.2–1.2)
Total Protein: 7.1 g/dL (ref 6.0–8.3)

## 2024-04-26 LAB — IBC + FERRITIN
Ferritin: 142 ng/mL (ref 10.0–291.0)
Iron: 91 ug/dL (ref 42–145)
Saturation Ratios: 29 % (ref 20.0–50.0)
TIBC: 313.6 ug/dL (ref 250.0–450.0)
Transferrin: 224 mg/dL (ref 212.0–360.0)

## 2024-04-26 LAB — TSH: TSH: 7 u[IU]/mL — ABNORMAL HIGH (ref 0.35–5.50)

## 2024-04-27 ENCOUNTER — Telehealth: Payer: Self-pay

## 2024-04-27 ENCOUNTER — Ambulatory Visit (INDEPENDENT_AMBULATORY_CARE_PROVIDER_SITE_OTHER)

## 2024-04-27 VITALS — Ht 60.0 in | Wt 233.0 lb

## 2024-04-27 DIAGNOSIS — Z0000 Encounter for general adult medical examination without abnormal findings: Secondary | ICD-10-CM

## 2024-04-27 DIAGNOSIS — Z7901 Long term (current) use of anticoagulants: Secondary | ICD-10-CM

## 2024-04-27 DIAGNOSIS — Z1231 Encounter for screening mammogram for malignant neoplasm of breast: Secondary | ICD-10-CM

## 2024-04-27 MED ORDER — LEVOTHYROXINE SODIUM 25 MCG PO TABS: 50.0000 ug | ORAL_TABLET | Freq: Every day | ORAL | 5 refills | Status: DC

## 2024-04-27 NOTE — Telephone Encounter (Signed)
 From: Ethyl Kirsch, RN  Sent: 04/27/2024  11:22 AM EDT  To: Mardy HERO Allwardt, PA-C  Subject: RE: Eliquis  to Coumadin                         Hi Alyssa,  I will  contact the pt this afternoon when I have a break in my schedule. Will we be using the Eliquis  to bridge pt to warfarin, or would you want to use Lovenox ? Normal starting dose for warfarin is 5 mg, but noticed she had acute kidney injury when in pt, but kidney function appears to have returned to normal parameters. Would you like to start with 5 mg daily, as the normal dosing or a different dose of warfarin. Her INR range for this diagnosis will be 2.0-3.0.  Thank you for your help.  Kirsch, RN

## 2024-04-27 NOTE — Telephone Encounter (Signed)
 LVM for pt to return call to coumadin  clinic.  Will advised pt also that there are samples of Eliquis  for her to pick up at [facility].

## 2024-04-27 NOTE — Patient Instructions (Signed)
 Veronica Weber , Thank you for taking time out of your busy schedule to complete your Annual Wellness Visit with me. I enjoyed our conversation and look forward to speaking with you again next year. I, as well as your care team,  appreciate your ongoing commitment to your health goals. Please review the following plan we discussed and let me know if I can assist you in the future. Your Game plan/ To Do List    Referrals: If you haven't heard from the office you've been referred to, please reach out to them at the phone provided.   Follow up Visits: We will see or speak with you next year for your Next Medicare AWV with our clinical staff Have you seen your provider in the last 6 months (3 months if uncontrolled diabetes)? Yes  Clinician Recommendations:  Aim for 30 minutes of exercise or brisk walking, 6-8 glasses of water , and 5 servings of fruits and vegetables each day.        This is a list of the screenings recommended for you:  Health Maintenance  Topic Date Due   Mammogram  06/17/2023   Screening for Lung Cancer  08/12/2023   Medicare Annual Wellness Visit  02/12/2024   Zoster (Shingles) Vaccine (1 of 2) 07/26/2024*   Flu Shot  11/29/2024*   DTaP/Tdap/Td vaccine (1 - Tdap) 04/25/2025*   Cologuard (Stool DNA test)  04/25/2025*   Pneumococcal Vaccine for age over 45  Completed   DEXA scan (bone density measurement)  Completed   HPV Vaccine  Aged Out   Meningitis B Vaccine  Aged Out   COVID-19 Vaccine  Discontinued   Hepatitis C Screening  Discontinued  *Topic was postponed. The date shown is not the original due date.    Advanced directives: (Declined) Advance directive discussed with you today. Even though you declined this today, please call our office should you change your mind, and we can give you the proper paperwork for you to fill out. Advance Care Planning is important because it:  [x]  Makes sure you receive the medical care that is consistent with your values,  goals, and preferences  [x]  It provides guidance to your family and loved ones and reduces their decisional burden about whether or not they are making the right decisions based on your wishes.  Follow the link provided in your after visit summary or read over the paperwork we have mailed to you to help you started getting your Advance Directives in place. If you need assistance in completing these, please reach out to us  so that we can help you!  See attachments for Preventive Care and Fall Prevention Tips.

## 2024-04-27 NOTE — Telephone Encounter (Signed)
 Patient came in and picked up medication samples.

## 2024-04-27 NOTE — Telephone Encounter (Signed)
-----   Message from Mardy HERO Allwardt sent at 04/27/2024 11:00 AM EDT ----- Regarding: Eliquis  to Coumadin  Please contact patient that I have a sample box of Eliquis  (5 mg to take twice daily) here that should last her through the next week.  Clotilda, can we try to get patient established with you and coumadin  clinic? She cannot afford Eliquis . History of CVA and now new PE / DVT, anticipate lifelong anticoagulation.   Thanks everyone!

## 2024-04-27 NOTE — Telephone Encounter (Signed)
 Allwardt, Mardy HERO, PA-C  Ethyl Kirsch, RN Hi Kirsch Serene, thanks for your help. I think we're ok to start with the 5 mg daily dose for warfarin.  How long would a bridge with Eliquis  take? I have one week of a sample pack to provider her, otherwise she cannot afford financially.  Thanks, Alyssa Allwardt, PA-C

## 2024-04-27 NOTE — Telephone Encounter (Addendum)
 Spoke with patient and patient says can no longer afford eliquis , wanting to know if there is cheaper alternative. Also asking refill request for Levothyroxine . Refill request sent in to pharmacy verified.   Copied from CRM [id number]. Topic: General - Other >> Apr 26, 2024  4:19 PM Veronica Weber wrote: Reason for CRM: patient called stating she can not afford the medication-eliquis . Patient stated she will be out of her medication today. Patient stated she found a pink bottle of pills in her cabinet and she does not know if it is coumadin    CB [phone]

## 2024-04-27 NOTE — Telephone Encounter (Signed)
 The patient was left a voicemail that she had something waiting at the front desk for her and to please contact the office with any questions.

## 2024-04-27 NOTE — Telephone Encounter (Signed)
 Left VM for patient regarding PCP message about sample eliquis . Advised to call office back.

## 2024-04-27 NOTE — Progress Notes (Signed)
 Subjective:   Veronica Weber is a 68 y.o. who presents for a Medicare Wellness preventive visit.  As a reminder, Annual Wellness Visits don't include a physical exam, and some assessments may be limited, especially if this visit is performed virtually. We may recommend an in-person follow-up visit with your provider if needed.  Visit Complete: Virtual I connected with  Veronica Weber on 04/27/24 by a audio enabled telemedicine application and verified that I am speaking with the correct person using two identifiers.  Patient Location: Home  Provider Location: Home Office  I discussed the limitations of evaluation and management by telemedicine. The patient expressed understanding and agreed to proceed.  Vital Signs: Because this visit was a virtual/telehealth visit, some criteria may be missing or patient reported. Any vitals not documented were not able to be obtained and vitals that have been documented are patient reported.  VideoDeclined- This patient declined Librarian, academic. Therefore the visit was completed with audio only.  Persons Participating in Visit: Patient.  AWV Questionnaire: No: Patient Medicare AWV questionnaire was not completed prior to this visit.  Cardiac Risk Factors include: advanced age (>75men, >93 women);hypertension;dyslipidemia;obesity (BMI >30kg/m2)     Objective:    Today's Vitals   04/27/24 1139  Weight: 233 lb (105.7 kg)  Height: 5' (1.524 m)   Body mass index is 45.5 kg/m.     04/27/2024   11:47 AM 02/12/2023   11:37 AM 06/17/2022    2:19 PM 05/22/2022    6:53 AM 05/15/2022    2:18 PM 11/19/2021    4:00 PM 11/17/2021    4:18 AM  Advanced Directives  Does Patient Have a Medical Advance Directive? No No No No No No   Does patient want to make changes to medical advance directive?   No - Patient declined      Would patient like information on creating a medical advance directive? No - Patient  declined No - Patient declined  No - Patient declined No - Patient declined No - Patient declined No - Patient declined    Current Medications (verified) Outpatient Encounter Medications as of 04/27/2024  Medication Sig   aspirin  81 MG chewable tablet Chew 1 tablet (81 mg total) by mouth 2 (two) times daily.   atorvastatin  (LIPITOR ) 80 MG tablet TAKE 1 TABLET BY MOUTH EVERY MORNING   cholecalciferol (VITAMIN D3) 25 MCG (1000 UNIT) tablet Take 2,000 Units by mouth daily.   gabapentin  (NEURONTIN ) 300 MG capsule Take 2 capsules (600 mg total) by mouth at bedtime. TAKE 1 TO 2 CAPSULES BY MOUTH AT bedtime AS DIRECTED   levothyroxine  (SYNTHROID ) 25 MCG tablet Take 2 tablets (50 mcg total) by mouth daily before breakfast.   losartan  (COZAAR ) 50 MG tablet TAKE 1 TABLET BY MOUTH DAILY   naltrexone  (DEPADE) 50 MG tablet TAKE 1/2 TABLET BY MOUTH ONCE DAILY   apixaban  (ELIQUIS ) 5 MG TABS tablet Take 2 tablets (10 mg total) by mouth 2 (two) times daily for 4 days, THEN 1 tablet (5 mg total) 2 (two) times daily.   apixaban  (ELIQUIS ) 5 MG TABS tablet Take 1 tablet (5 mg total) by mouth 2 (two) times daily. (Patient not taking: Reported on 04/27/2024)   No facility-administered encounter medications on file as of 04/27/2024.    Allergies (verified) Patient has no known allergies.   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   Procedure: LEFT TOTAL KNEE ARTHROPLASTY;  Surgeon: Addie Cordella Hamilton, MD;  Location:  [facility] OR;  Service: Orthopedics;  Laterality: Left;   TUBAL LIGATION  1994   Family History  Problem Relation Age of Onset   Arthritis Mother    Cancer Maternal Grandmother    Diabetes Maternal Grandmother    Diabetes Maternal Grandfather    Healthy Daughter    Healthy Son    Social History   Socioeconomic History   Marital status: Divorced    Spouse name: Not on file   Number of children: 2   Years of education: Not on file   Highest education level: Some college, no degree  Occupational History   Not on file  Tobacco Use   Smoking status: Former    Current packs/day: 0.00    Average packs/day: 1 pack/day for 46.0 years (46.0 ttl pk-yrs)    Types: Cigarettes    Start date: 06/01/1976    Quit date: 05/15/2022    Years since quitting: 1.9   Smokeless tobacco: Never  Vaping Use   Vaping status: Never Used  Substance and Sexual Activity   Alcohol use: Not Currently   Drug use: Never   Sexual activity: Not Currently  Other Topics Concern   Not on file  Social History Narrative   Lives alone   Caffeine- coffee 1 -2 c daily   Social Drivers of Health   Financial Resource Strain: Low Risk  (04/27/2024)   Overall Financial Resource Strain (CARDIA)    Difficulty of Paying Living Expenses: Not hard at all  Food Insecurity: No Food Insecurity (04/27/2024)   Hunger Vital Sign    Worried About Running Out of Food in the Last Year: Never true    Ran Out of Food in the Last Year: Never true  Transportation Needs: No Transportation Needs (04/27/2024)   PRAPARE - Administrator, Civil Service (Medical): No    Lack of Transportation (Non-Medical): No  Physical Activity: Sufficiently Active (04/27/2024)   Exercise Vital Sign    Days of Exercise per Week: 7 days    Minutes of Exercise per Session: 30 min  Stress: No Stress Concern Present (04/27/2024)   [employer] of Occupational Health - Occupational Stress Questionnaire    Feeling of Stress: Not at all  Social  Connections: Socially Isolated (04/27/2024)   Social Connection and Isolation Panel    Frequency of Communication with Friends and Family: Three times a week    Frequency of Social Gatherings with Friends and Family: Once a week    Attends Religious Services: Never    Database administrator or Organizations: No    Attends Engineer, structural: Never    Marital Status: Divorced    Tobacco Counseling Counseling given: Not Answered    Clinical Intake:  Pre-visit preparation completed: Yes  Pain : No/denies pain     BMI - recorded: 45.5 Nutritional Status: BMI > 30  Obese Nutritional Risks: None Diabetes: No  Lab Results  Component Value Date   HGBA1C 6.1 08/27/2023   HGBA1C 5.8 (A) 02/24/2023   HGBA1C 6.1 05/06/2022     How often do you need to have someone help you when you read instructions, pamphlets, or other written materials from your doctor or pharmacy?: 1 - Never  Interpreter Needed?: No  Information entered by :: Ellouise Haws, LPN   Activities of Daily Living      04/27/2024   11:41 AM  In your present state of health, do you have any difficulty performing the following activities:  Hearing? 0  Vision? 0  Difficulty concentrating or making decisions? 0  Walking or climbing stairs? 1  Comment at times  Dressing or bathing? 0  Doing errands, shopping? 0  Preparing Food and eating ? N  Using the Toilet? N  In the past six months, have you accidently leaked urine? Y  Comment wears briefs  Do you have problems with loss of bowel control? N  Managing your Medications? N  Managing your Finances? N  Housekeeping or managing your Housekeeping? N    Patient Care Team: Allwardt, Alyssa M, PA-C as PCP - General (Physician Assistant) Alvan Ronal BRAVO, MD (Inactive) as PCP - Cardiology (Cardiology) Nicholaus Sherlean CROME, [facility] (Inactive) as Pharmacist (Pharmacist)   I have updated your Care Teams any recent Medical Services you may have received from  other providers in the past year.     Assessment:   This is a routine wellness examination for Veronica Weber.  Hearing/Vision screen Hearing Screening - Comments:: Pt denies any hearing issues  Vision Screening - Comments:: Wears rx glasses - up to date with routine eye exams with [facility] ophthalmology    Goals Addressed             This Visit's Progress    Patient Stated       Get knee in better shape        Depression Screen      04/27/2024   11:43 AM 08/27/2023    3:16 PM 02/12/2023   11:36 AM 05/13/2022   11:35 AM 12/16/2021   11:06 AM 12/05/2021    1:19 PM 05/27/2021   10:56 AM  PHQ 2/9 Scores  PHQ - 2 Score 0 2 0 0 0 0 0  PHQ- 9 Score  12  3       Fall Risk      04/27/2024   11:46 AM 10/27/2023    1:16 PM 08/27/2023    3:16 PM 02/12/2023   11:38 AM 05/13/2022   11:34 AM  Fall Risk   Falls in the past year? 1 0 0 0 1  Number falls in past yr: 1 0 0 0 0  Injury with Fall? 0 0 0 0 0  Risk for fall due to : History of fall(s);Impaired balance/gait;Impaired mobility No Fall Risks  Impaired vision;Impaired mobility   Risk for fall due to: Comment    related to knees   Follow up Falls prevention discussed Falls evaluation completed  Falls prevention discussed     MEDICARE RISK AT HOME:  Medicare Risk at Home Any stairs in or around the home?: Yes If so, are there any without handrails?: No Home free of loose throw rugs in walkways, pet beds, electrical cords, etc?: Yes Adequate lighting in your home to reduce risk of falls?: Yes Life alert?: No Use of a cane, walker or w/c?: No Grab bars in the bathroom?:  No Shower chair or bench in shower?: Yes Elevated toilet seat or a handicapped toilet?: No  TIMED UP AND GO:  Was the test performed?  No  Cognitive Function: 6CIT completed        04/27/2024   11:48 AM 02/12/2023   11:39 AM  6CIT Screen  What Year? 0 points 0 points  What month? 0 points 0 points  What time? 0 points 0 points  Count back from 20 0  points 0 points  Months in reverse 0 points 0 points  Repeat phrase 0 points 0 points  Total Score 0 points 0 points    Immunizations Immunization History  Administered Date(s) Administered   Fluad Quad(high Dose 65+) 06/01/2022   Fluad Trivalent(High Dose 65+) 08/27/2023   Influenza-Unspecified 05/13/2021   Moderna Covid-19 Vaccine Bivalent Booster 26yrs & up 06/13/2021   Moderna Sars-Covid-2 Vaccination 11/14/2019, 12/12/2019   PNEUMOCOCCAL CONJUGATE-20 11/17/2021    Screening Tests Health Maintenance  Topic Date Due   MAMMOGRAM  06/17/2023   Lung Cancer Screening  08/12/2023   Zoster Vaccines- Shingrix (1 of 2) 07/26/2024 (Originally 01/29/1975)   INFLUENZA VACCINE  11/29/2024 (Originally 04/01/2024)   DTaP/Tdap/Td (1 - Tdap) 04/25/2025 (Originally 01/29/1975)   Fecal DNA (Cologuard)  04/25/2025 (Originally 01/28/2001)   Medicare Annual Wellness (AWV)  04/27/2025   Pneumococcal Vaccine: 50+ Years  Completed   DEXA SCAN  Completed   HPV VACCINES  Aged Out   Meningococcal B Vaccine  Aged Out   COVID-19 Vaccine  Discontinued   Hepatitis C Screening  Discontinued    Health Maintenance  Health Maintenance Due  Topic Date Due   MAMMOGRAM  06/17/2023   Lung Cancer Screening  08/12/2023   Health Maintenance Items Addressed: Mammogram sent for screening due    Additional Screening:  Vision Screening: Recommended annual ophthalmology exams for early detection of glaucoma and other disorders of the eye. Would you like a referral to an eye doctor? No    Dental Screening: Recommended annual dental exams for proper oral hygiene  Community Resource Referral / Chronic Care Management: CRR required this visit?  No   CCM required this visit?  No   Plan:    I have personally reviewed and noted the following in the patient's chart:   Medical and social history Use of alcohol, tobacco or illicit drugs  Current medications and supplements including opioid prescriptions.  Patient is not currently taking opioid prescriptions. Functional ability and status Nutritional status Physical activity Advanced directives List of other physicians Hospitalizations, surgeries, and ER visits in previous 12 months Vitals Screenings to include cognitive, depression, and falls Referrals and appointments  In addition, I have reviewed and discussed with patient certain preventive protocols, quality metrics, and best practice recommendations. A written personalized care plan for preventive services as well as general preventive health recommendations were provided to patient.   Ellouise VEAR Haws, LPN   1/72/7974   After Visit Summary: (MyChart) Due to this being a telephonic visit, the after visit summary with patients personalized plan was offered to patient via MyChart   Notes: Nothing significant to report at this time.

## 2024-04-28 ENCOUNTER — Ambulatory Visit: Payer: Self-pay | Admitting: Physician Assistant

## 2024-04-28 NOTE — Telephone Encounter (Signed)
 LVM for pt to return call.  Have not sent in script for warfarin yet

## 2024-04-28 NOTE — Telephone Encounter (Signed)
 LVM again

## 2024-04-29 MED ORDER — WARFARIN SODIUM 5 MG PO TABS: 5.0000 mg | ORAL_TABLET | Freq: Every day | ORAL | 0 refills | Status: DC

## 2024-04-29 NOTE — Addendum Note (Signed)
 Addended by: ETHYL KIRSCH A on: 04/29/2024 04:26 PM   Modules accepted: Orders

## 2024-04-29 NOTE — Telephone Encounter (Signed)
 Received msg from PCP that pt was on the phone with her CMA. Advised I would call her back in about 15 minutes since I currently have a pt in office. PCP was able to provide one more week or Eliquis  samples.

## 2024-04-29 NOTE — Telephone Encounter (Signed)
 Left another VM for pt.  Contacted pt's mother, Hydie, to request help contacting pt. She reports she is not sure why pt is not returning call. She will contact her today and let her know she needs to contact the coumadin  clinic.

## 2024-04-29 NOTE — Telephone Encounter (Addendum)
 A full discussion of the nature of anticoagulants has been carried out.  A benefit risk analysis has been presented to the patient, so that they understand the justification for choosing anticoagulation at this time. The need for frequent and regular monitoring, precise dosage adjustment and compliance is stressed.  Side effects of potential bleeding are discussed.  The patient should avoid any OTC items containing aspirin  or ibuprofen, and should avoid great swings in general diet.  Avoid alcohol consumption.  Call if any signs of abnormal bleeding.     Answered all questions. Provided direct number to coumadin  clinic. Advised if any further questions feel free to contact the coumadin  clinic. Advised pt will need to go to the [facility] lab on 9/4 for lab INR draw. Advised this nurse will f/u with dosing adjustment on 9/4 afternoon. Pt placed coumadin  clinic number in her phone so it will show up for her to answer.  Pt voiced she will start warfarin tonight and continue the Eliquis  until advised to stop by the coumadin  clinic. Pt verbalized understanding.  Sent in script for warfarin and placed STAT INR order for next week. Pt has been scheduled for lab.

## 2024-05-03 ENCOUNTER — Telehealth: Payer: Self-pay

## 2024-05-03 NOTE — Telephone Encounter (Signed)
 Copied from CRM #[id number]. Topic: Clinical - Medication Question >> Apr 29, 2024  1:59 PM Tysheama G wrote: Reason for CRM: Patient stated she received a call this morning but missed it. So wanted somone to call her back. She did mention about them leaving a VM saying about changing her prescription. Callback number is [phone]  Returned pt call and lvm for patient to return my call in office with cb number. Advised Levothyroxine  sent to pharmacy with 0 copay

## 2024-05-04 NOTE — Progress Notes (Signed)
   05/04/2024 Name: Veronica Weber MRN: [id number] DOB: 23-Jul-1956  Chief Complaint  Patient presents with   Medication Management    Acccess / med cost     Subjective: Request from PCP team member, Koleen Robson to contact patient regarding cost of her medications. Patient was in the office yesterday and reported having difficulty with the cost of medications.   Medication Access/Adherence  Current Pharmacy:  [id number] - [place], [place] - [address] [place] [address] Phone: [phone] Fax: [phone]  [facility] [place]  - [place] - [address] [place] [address] Phone: [phone] Fax: [phone]  [facility] [place], [place] - [address] [place] [address] Phone: [phone] Fax: [phone]  [facility] [address] [place] [address] Phone: [phone] Fax: [phone]    Objective:  Lab Results  Component Value Date   HGBA1C 6.1 08/27/2023    Lab Results  Component Value Date   CREATININE 0.97 04/25/2024   BUN 16 04/25/2024   NA 143 04/25/2024   K 3.8 04/25/2024   CL 101 04/25/2024   CO2 31 04/25/2024    Lab Results  Component Value Date   CHOL 157 08/27/2023   HDL 55.50 08/27/2023   LDLCALC 61 08/27/2023   TRIG 201.0 (H) 08/27/2023   CHOLHDL 3 08/27/2023   Current Outpatient Medications  Medication Instructions   apixaban  (ELIQUIS ) 5 MG TABS tablet Take 2 tablets (10 mg total) by mouth 2 (two) times daily for 4 days, THEN 1 tablet (5 mg total) 2 (two) times daily.   apixaban  (ELIQUIS ) 5 mg, Oral, 2 times daily   aspirin  81 mg, Oral, 2 times daily   atorvastatin  (LIPITOR ) 80 mg, Oral, Every morning   cholecalciferol (VITAMIN D3) 2,000 Units, Daily   gabapentin  (NEURONTIN ) 600 mg, Oral, Daily at  bedtime, TAKE 1 TO 2 CAPSULES BY MOUTH AT bedtime AS DIRECTED   levothyroxine  (SYNTHROID ) 50 mcg, Oral, Daily before breakfast   losartan  (COZAAR ) 50 mg, Oral, Daily   naltrexone  (DEPADE) 25 mg, Oral, Daily   warfarin (COUMADIN ) 5 mg, Oral, Daily    Assessment/Plan:   Medication Management / Access:  - reviewed patient's 2025 [facility] plan [phone]). She has a $225 deductible to meet, then cost of Eliquis  and naltrexone  will be $47 / 30 days or $131 / 90 days.  Gabapentin  cost is $5 / 30 days and $0 if she gets thru mail order.  All other medications - levothyroxine , losasrtan and atorvastatin  are $0 copay all year.   Tried to call patient to discuss medication cost but had to LM on VM. I did leave her 2 weeks of Eliquis  samples and an application for Eliquis  patient assistance program (the program does usually require patient's to spend 3% of income out of pocket before they will qualify for patient assistance program). Would like to assess to see if patient would qualify for LIS / Medicare Extra Help which would also lower monthly medication cost. Left my call back number [phone]  Madelin Ray, PharmD Clinical Pharmacist [facility]  Population Health [phone]

## 2024-05-05 ENCOUNTER — Ambulatory Visit: Payer: Self-pay | Admitting: Physician Assistant

## 2024-05-05 ENCOUNTER — Other Ambulatory Visit

## 2024-05-05 ENCOUNTER — Telehealth: Payer: Self-pay

## 2024-05-05 ENCOUNTER — Ambulatory Visit (INDEPENDENT_AMBULATORY_CARE_PROVIDER_SITE_OTHER): Payer: Self-pay

## 2024-05-05 DIAGNOSIS — Z7901 Long term (current) use of anticoagulants: Secondary | ICD-10-CM

## 2024-05-05 LAB — PROTIME-INR
INR: 4.5 ratio — ABNORMAL HIGH (ref 0.8–1.0)
Prothrombin Time: 44.9 s (ref 9.6–13.1)

## 2024-05-05 NOTE — Telephone Encounter (Signed)
 Veronica Weber from [facility] called with a critical for pt.  PT: 44.9

## 2024-05-05 NOTE — Telephone Encounter (Signed)
 Critical to contact patient today  FIrst- I'm ok if coumadin  team has alternate instructions based on established protocols but I believe Veronica Weber is out of office and not sure of coverage  STOP eliquis  with INR over 3.   Please confirm no bleeding with elevated INR- if present go to hospital such as GI bleed or if that occurs later  Have her hold dose today of coumadin   IF she has taken dose today of 5 mg coumadin  already then have her hold Friday dose  Take half dose or 2.5 mg on Saturday then 5 mg on Sunday then contact Alyssa/Shannon again on monday

## 2024-05-05 NOTE — Telephone Encounter (Signed)
 Contacted pt by phone. Pt reports she was also contacted by the office and given instructions. She reports the office also advised her to stop taking aspirin . Updated dosing from coumadin  clinic; Pt already took warfarin today. STOP eliquis  with INR over 3. Pt reports she does not have any Eliquis  left. Hold warfarin tomorrow, reduce dose on Saturday to 1/2 tablet, take 1 tablet on Sunday, 1/2 tablet on Monday, and we will recheck INR at your apt with Mardy Dyke, NP on Tuesday. 9/9. Pt denies any s/s of bleeding or abnormal bruising. Advised if any s/s to go to ER. Pt verbalized understanding.

## 2024-05-05 NOTE — Telephone Encounter (Signed)
 Left voicemail for patient requesting call back as soon as she gets the message.

## 2024-05-05 NOTE — Patient Instructions (Addendum)
 Pre visit review using our clinic review tool, if applicable. No additional management support is needed unless otherwise documented below in the visit note.  STOP eliquis  with INR over 3. Hold warfarin tomorrow, reduce dose on Saturday to 1/2 tablet, take 1 tablet on Sunday, 1/2 tablet on Monday, and we will recheck INR at your apt with Mardy Dyke, NP on Tuesday. 9/9.

## 2024-05-05 NOTE — Progress Notes (Signed)
 Pt is new to warfarin due to cost of Eliquis . Indication: History of CVA , PE / DVT Pt went to [facility] lab today for stat INR.  Result came back critical and instructions given by Dr. Katrinka, and PCP Mardy Dyke, NP, are below; Critical to contact patient today   FIrst- I'm ok if coumadin  team has alternate instructions based on established protocols but I believe Clotilda is out of office and not sure of coverage. Please confirm no bleeding with elevated INR- if present go to hospital such as GI bleed or if that occurs later. STOP eliquis  with INR over 3.  Have her hold dose today of coumadin  IF she has taken dose today of 5 mg coumadin  already then have her hold Friday dose Take half dose or 2.5 mg on Saturday then 5 mg on Sunday then contact Alyssa/Remona Boom again on Monday.   Contacted pt by phone. Pt reports she was also contacted by the office and given instructions. She reports the office also advised her to stop taking aspirin . Updated dosing from coumadin  clinic; Pt already took warfarin today. STOP eliquis  with INR over 3. Pt reports she does not have any Eliquis  left. Hold warfarin tomorrow, reduce dose on Saturday to 1/2 tablet, take 1 tablet on Sunday, 1/2 tablet on Monday, and we will recheck INR at your apt with Mardy Dyke, NP on Tuesday. 9/9. Pt denies any s/s of bleeding or abnormal bruising. Advised if any s/s to go to ER. Pt verbalized understanding.

## 2024-05-05 NOTE — Telephone Encounter (Signed)
 Called pt and verified no bleeding at this time; advised pt provider recommendations from today through Monday morning with medication and dosage. Pt verbally repeated instructions back 2 times and verified understanding and if any bleeding occurs she will report to ED.

## 2024-05-07 NOTE — Telephone Encounter (Signed)
 Noted and agreed, thank you. See result note from [facility] as well.

## 2024-05-09 ENCOUNTER — Telehealth: Payer: Self-pay | Admitting: *Deleted

## 2024-05-09 ENCOUNTER — Other Ambulatory Visit: Payer: Self-pay | Admitting: Physician Assistant

## 2024-05-09 DIAGNOSIS — R519 Headache, unspecified: Secondary | ICD-10-CM

## 2024-05-09 DIAGNOSIS — M5431 Sciatica, right side: Secondary | ICD-10-CM

## 2024-05-09 DIAGNOSIS — G4709 Other insomnia: Secondary | ICD-10-CM

## 2024-05-09 NOTE — Progress Notes (Unsigned)
 Care Guide Pharmacy Note  05/09/2024 Name: Veronica Weber MRN: [id number] DOB: [birth year]  Referred By: Kathrene Mardy HERO, PA-C Reason for referral: Call Attempt #1 and Complex Care Management (Outreach to schedule referral with pharmacist )   Veronica Weber is a 68 y.o. year old female who is a primary care patient of Allwardt, Alyssa M, PA-C.  Veronica Weber was referred to the pharmacist for assistance related to: HTN  An unsuccessful telephone outreach was attempted today to contact the patient who was referred to the pharmacy team for assistance with medication management. Additional attempts will be made to contact the patient.  Thedford Franks, CMA [place]  [facility] Guide Direct Dial: [phone]  Fax: [phone] Website: [email or web address]

## 2024-05-10 ENCOUNTER — Ambulatory Visit (INDEPENDENT_AMBULATORY_CARE_PROVIDER_SITE_OTHER): Admitting: Physician Assistant

## 2024-05-10 ENCOUNTER — Encounter: Payer: Self-pay | Admitting: Physician Assistant

## 2024-05-10 ENCOUNTER — Ambulatory Visit (INDEPENDENT_AMBULATORY_CARE_PROVIDER_SITE_OTHER): Payer: Self-pay

## 2024-05-10 VITALS — BP 152/84 | HR 94 | Temp 98.1°F | Ht 60.0 in | Wt 235.4 lb

## 2024-05-10 DIAGNOSIS — I10 Essential (primary) hypertension: Secondary | ICD-10-CM | POA: Diagnosis not present

## 2024-05-10 DIAGNOSIS — Z86711 Personal history of pulmonary embolism: Secondary | ICD-10-CM

## 2024-05-10 DIAGNOSIS — I714 Abdominal aortic aneurysm, without rupture, unspecified: Secondary | ICD-10-CM

## 2024-05-10 DIAGNOSIS — Z23 Encounter for immunization: Secondary | ICD-10-CM | POA: Diagnosis not present

## 2024-05-10 DIAGNOSIS — K760 Fatty (change of) liver, not elsewhere classified: Secondary | ICD-10-CM

## 2024-05-10 DIAGNOSIS — Z7901 Long term (current) use of anticoagulants: Secondary | ICD-10-CM

## 2024-05-10 DIAGNOSIS — E039 Hypothyroidism, unspecified: Secondary | ICD-10-CM | POA: Diagnosis not present

## 2024-05-10 DIAGNOSIS — I825Z2 Chronic embolism and thrombosis of unspecified deep veins of left distal lower extremity: Secondary | ICD-10-CM

## 2024-05-10 LAB — PROTIME-INR
INR: 2.1 — ABNORMAL HIGH
Prothrombin Time: 21.4 s — ABNORMAL HIGH (ref 9.0–11.5)

## 2024-05-10 NOTE — Progress Notes (Unsigned)
 Care Guide Pharmacy Note  05/10/2024 Name: Veronica Weber MRN: [id number] DOB: Aug 05, 1956  Referred By: Kathrene Mardy HERO, PA-C Reason for referral: Call Attempt #1 and Complex Care Management (Outreach to schedule referral with pharmacist )   Veronica Weber is a 68 y.o. year old female who is a primary care patient of Allwardt, Alyssa M, PA-C.  Veronica Weber was referred to the pharmacist for assistance related to: HTN  A second unsuccessful telephone outreach was attempted today to contact the patient who was referred to the pharmacy team for assistance with medication management. Additional attempts will be made to contact the patient.  Thedford Franks, CMA [place]  [facility], [facility] Guide Direct Dial: [phone]  Fax: [phone] Website: [email or web address]

## 2024-05-10 NOTE — Telephone Encounter (Signed)
 Last Visit: 04/25/24  Next Visit: 05/10/24  Last Filled: 12/29/23  Quantity: 180 w/1 refill

## 2024-05-10 NOTE — Progress Notes (Signed)
 Patient ID: Veronica Weber, female    DOB: 1956-04-15, 68 y.o.   MRN: [id number]   Assessment & Plan:  Long term (current) use of anticoagulants -     Protime-INR; Future  Lower leg DVT (deep venous thromboembolism), chronic, left (HCC) -     Protime-INR; Future  History of pulmonary embolus (PE) -     Protime-INR; Future  Essential hypertension  Immunization due -     Flu vaccine HIGH DOSE PF(Fluzone Trivalent)  Abdominal aortic aneurysm (AAA) 3.0 cm to 5.0 cm in diameter in female [facility])  Hepatic steatosis  Hypothyroidism, unspecified type      Assessment and Plan Assessment & Plan Essential hypertension Blood pressure is elevated at 153/83 mmHg. Previously reduced losartan  dosage to 25 mg, but blood pressure is trending upwards. - Increase losartan  to 50 mg daily - Monitor blood pressure regularly - Follow up in 4 weeks for blood pressure evaluation  Chronic anticoagulation for history of pulmonary embolism and lower extremity DVT On long-term anticoagulation therapy with warfarin. Recent INR levels were critical, leading to adjustments in medication. Stopped aspirin  and Eliquis . Emphasized the importance of anticoagulation to prevent further clots that could lead to serious complications such as heart, lung, or brain embolism. - Order stat INR - Adjust warfarin dosing based on INR results - Monitor for signs of bleeding or clotting  Abdominal aortic aneurysm without rupture Scheduled to follow up with vascular specialists on October 16th to monitor the aneurysm. No acute issues reported. - Ensure follow-up with vascular specialists on October 16th  Fatty liver (hepatic steatosis) Liver function tests are elevated but not dangerously so. Discussed the possibility of non-alcoholic fatty liver disease (NASH) and the importance of monitoring liver function. She does not consume alcohol. - Monitor liver function tests during thyroid   check-ups  Hypothyroidism Currently taking levothyroxine , but there is confusion about the dosage. Bottle indicates twice daily dosing, but standard is once daily. - Verify levothyroxine  dosage - Check thyroid  levels in 1-2 months      Return in about 4 weeks (around 06/07/2024) for blood pressure check, recheck/follow-up.    Subjective:    Chief Complaint  Patient presents with   Medical Management of Chronic Issues    Pt in office for f/u with PCP; recent critical INR last week; pt followed in office with Coumadin  clinic nurse; pt also received call from nurse Clotilda and followed her instructions;     HPI Discussed the use of AI scribe software for clinical note transcription with the patient, who gave verbal consent to proceed.  History of Present Illness Veronica Weber is a 68 year old female with a history of pulmonary embolism and abdominal aortic aneurysm who presents for anticoagulation management and blood pressure follow-up.  She is currently on warfarin for anticoagulation due to a history of pulmonary embolism and lower leg deep vein thrombosis (DVT). Recent blood work showed a critical INR level, leading to adjustments in her medication regimen. She stopped taking aspirin  and Eliquis  and has been taking warfarin with dosage adjustments over the past week, including half doses on certain days. She is awaiting further instructions based on her INR results.  She is managing hypertension and has been taking losartan . Recently, her blood pressure readings have been elevated, with a recent reading of 153/83. She has been taking losartan  at a reduced dose of 25 mg. She monitors her blood pressure at home using a wrist monitor.  She has a history of  hepatic steatosis and her liver function tests remain elevated. She does not consume alcohol and last drank about ten years ago.  She is on levothyroxine  for thyroid  management, taking two tablets before breakfast  as per her current regimen. She is uncertain about the correct dosage and plans to verify her prescription. She has not taken her thyroid  medication today as she usually takes it later in the day.  Her family history includes Alzheimer's disease in her aunt and cousin, and a history of cancer and blood clots in other relatives.  Her social history includes living with her dogs and managing her own care. No chest pain, shortness of breath, or bleeding issues. Reports occasional memory lapses but no significant cognitive decline.     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   Hyperlipidemia    Hypertension    Stroke (HCC) 11/15/2021   Syncope and collapse 2012-2015   Thyroid  disease    Tobacco use     Past Surgical History:  Procedure Laterality Date   APPENDECTOMY  1976   CATARACT EXTRACTION     1997, 1998   CESAREAN SECTION  1983   CHOLECYSTECTOMY  1998   EYE SURGERY  cataracts   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   Procedure: LEFT TOTAL KNEE ARTHROPLASTY;  Surgeon: Addie Cordella Hamilton, MD;  Location: [facility] OR;  Service: Orthopedics;  Laterality: Left;   TUBAL LIGATION  1994    Family History  Problem Relation Age of Onset   Arthritis Mother    Cancer Maternal Grandmother    Diabetes Maternal Grandmother    Diabetes Maternal Grandfather    Healthy Daughter    Healthy Son    Alzheimer's disease Maternal Aunt     Social History   Tobacco Use   Smoking status: Former    Current packs/day: 0.00    Average packs/day: 1 pack/day for 46.0 years (46.0 ttl pk-yrs)    Types: Cigarettes    Start date: 06/01/1976    Quit date: 05/15/2022    Years since quitting: 1.9   Smokeless tobacco: Never  Vaping Use   Vaping status: Never Used  Substance Use Topics   Alcohol use: Not Currently   Drug use: Never     No Known Allergies  Review of  Systems NEGATIVE UNLESS OTHERWISE INDICATED IN HPI      Objective:     BP (!) 152/84 (BP Location: Left Arm, Patient Position: Sitting, Cuff Size: Large)   Pulse 94   Temp 98.1 F (36.7 C) (Temporal)   Ht 5' (1.524 m)   Wt 235 lb 6.4 oz (106.8 kg)   SpO2 97%   BMI 45.97 kg/m   Wt Readings from Last 3 Encounters:  05/10/24 235 lb 6.4 oz (106.8 kg)  04/27/24 233 lb (105.7 kg)  04/25/24 233 lb 12.8 oz (106.1 kg)    BP Readings from Last 3 Encounters:  05/10/24 (!) 152/84  04/25/24 108/70  03/07/24 115/67     Physical Exam Vitals and nursing note reviewed.  Constitutional:      Appearance: Normal appearance. She is obese. She is not toxic-appearing.  HENT:     Head: Normocephalic and atraumatic.     Right Ear: External ear normal.     Left Ear: External ear normal.  Eyes:  Extraocular Movements: Extraocular movements intact.     Conjunctiva/sclera: Conjunctivae normal.     Pupils: Pupils are equal, round, and reactive to light.  Cardiovascular:     Rate and Rhythm: Normal rate and regular rhythm.     Pulses: Normal pulses.     Heart sounds: Normal heart sounds.  Pulmonary:     Effort: Pulmonary effort is normal.     Breath sounds: Normal breath sounds.  Musculoskeletal:        General: Normal range of motion.     Cervical back: Normal range of motion and neck supple.  Skin:    General: Skin is warm and dry.  Neurological:     General: No focal deficit present.     Mental Status: She is alert and oriented to person, place, and time.  Psychiatric:        Mood and Affect: Mood normal.        Behavior: Behavior normal.        Thought Content: Thought content normal.        Judgment: Judgment normal.             Camyra Vaeth M Jennel Mara, PA-C

## 2024-05-10 NOTE — Patient Instructions (Addendum)
 Pre visit review using our clinic review tool, if applicable. No additional management support is needed unless otherwise documented below in the visit note.  Continue 1 tablet daily except take 1/2 tablet on Monday, Wednesday and Friday. Recheck in one week on 9/17 at [facility] coumadin  clinic.

## 2024-05-10 NOTE — Progress Notes (Signed)
 Pt is new to warfarin due to cost of Eliquis . Indication: History of CVA , PE / DVT Pt went to [facility] lab today for stat INR during a PCP apt. Result was obtained after review of chart. Pt had PCP apt today and aspirin  was d/c and losartan  dose was doubled.  Continue 1 tablet daily except take 1/2 tablet on Monday, Wednesday and Friday. Recheck in one week on 9/17 at [facility] [place] coumadin  clinic.  Contacted pt by phone and advised of dosing. Pt wrote down dosing and read back to confirm correct dosing. Scheduled pt for coumadin  clinic apt for one week. Pt verbalized understanding.

## 2024-05-10 NOTE — Patient Instructions (Addendum)
  VISIT SUMMARY: Today, we discussed your blood pressure management, anticoagulation therapy, and other health concerns. We made adjustments to your medications and planned follow-ups to ensure your conditions are well-managed.  YOUR PLAN: ESSENTIAL HYPERTENSION: Your blood pressure has been elevated recently. -Increase losartan  to 50 mg daily. -Monitor your blood pressure regularly. -Follow up in 4 weeks for blood pressure evaluation.  CHRONIC ANTICOAGULATION FOR HISTORY OF PULMONARY EMBOLISM AND LOWER EXTREMITY DVT: You are on long-term anticoagulation therapy with warfarin. Recent INR levels were critical, leading to adjustments in your medication. -Order stat INR. -Adjust warfarin dosing based on INR results. -Monitor for signs of bleeding or clotting.  ABDOMINAL AORTIC ANEURYSM WITHOUT RUPTURE: You have an abdominal aortic aneurysm that needs monitoring. -Ensure follow-up with vascular specialists on October 16th.  FATTY LIVER (HEPATIC STEATOSIS): Your liver function tests are elevated, possibly due to non-alcoholic fatty liver disease (NASH). -Monitor liver function tests during thyroid  check-ups.  HYPOTHYROIDISM: You are currently taking levothyroxine , but there is confusion about the dosage. -Verify levothyroxine  dosage. -Check thyroid  levels in 1-2 months.                      Contains text generated by [employer]bridge.                                 Contains text generated by [employer]bridge.

## 2024-05-11 ENCOUNTER — Telehealth: Payer: Self-pay

## 2024-05-11 NOTE — Progress Notes (Signed)
 Care Guide Pharmacy Note  05/11/2024 Name: Veronica Weber MRN: [id number] DOB: Sep 11, 1955  Referred By: Kathrene Mardy HERO, PA-C Reason for referral: Call Attempt #1 and Complex Care Management (Outreach to schedule referral with pharmacist )   Veronica Weber is a 68 y.o. year old female who is a primary care patient of Allwardt, Alyssa M, PA-C.  Veronica Weber was referred to the pharmacist for assistance related to: HTN  A third unsuccessful telephone outreach was attempted today to contact the patient who was referred to the pharmacy team for assistance with medication management. The Population Health team is pleased to engage with this patient at any time in the future upon receipt of referral and should he/she be interested in assistance from the Population Health team.  Thedford Franks, CMA [facility]  [facility], [facility] Guide Direct Dial: [phone]  Fax: [phone] Website: [email or web address]

## 2024-05-11 NOTE — Telephone Encounter (Signed)
 Noted and agreed, thank you.

## 2024-05-11 NOTE — Telephone Encounter (Signed)
 Pt was contacted late yesterday afternoon with warfarin dosing instructions.

## 2024-05-11 NOTE — Telephone Encounter (Signed)
 Copied from CRM [id number]. Topic: General - Other >> May 10, 2024  5:03 PM Sophia H wrote: Reason for CRM: Patient states she had a missed call from the clinic, no notes in chart? Please reach out to patient, ty. # [phone] Patient also has questions regarding new medication that is a blood thinner.  Please see pt call msg and advise; no call from clinical staff however thought this may be in reference to her labs at [facility] 05/10/24.

## 2024-05-18 ENCOUNTER — Ambulatory Visit (INDEPENDENT_AMBULATORY_CARE_PROVIDER_SITE_OTHER)

## 2024-05-18 ENCOUNTER — Other Ambulatory Visit: Payer: Self-pay

## 2024-05-18 DIAGNOSIS — I714 Abdominal aortic aneurysm, without rupture, unspecified: Secondary | ICD-10-CM

## 2024-05-18 DIAGNOSIS — Z7901 Long term (current) use of anticoagulants: Secondary | ICD-10-CM

## 2024-05-18 LAB — POCT INR: INR: 2.2 (ref 2.0–3.0)

## 2024-05-18 NOTE — Progress Notes (Signed)
 Pt is new to warfarin due to cost of Eliquis . Indication: History of CVA , PE / DVT Written educational material provided to pt today.  Pt reported she took a 1/2 tablet yesterday instead of a whole tablet. Updated dosing to repeat the same dosing she took for the last week since she is again in range.  Continue 1/2 tablet daily except take 1 tablet on Monday Wednesday and Friday. Recheck in one week on 9/24 at [facility] coumadin  clinic, at [address] [facility].  Advised pt to watch for s/s of abnormal bruising or bleeding and also s/s of a clot. Pt verbalized understanding and denies any s/s of either.  Pt was provided with direct number to coumadin  clinic.

## 2024-05-18 NOTE — Patient Instructions (Addendum)
 Pre visit review using our clinic review tool, if applicable. No additional management support is needed unless otherwise documented below in the visit note.  Continue 1/2 tablet daily except take 1 tablet on Monday Wednesday and Friday. Recheck in one week on 9/24 at [facility] coumadin  clinic, at [address] [facility].

## 2024-05-25 ENCOUNTER — Ambulatory Visit (INDEPENDENT_AMBULATORY_CARE_PROVIDER_SITE_OTHER)

## 2024-05-25 DIAGNOSIS — Z7901 Long term (current) use of anticoagulants: Secondary | ICD-10-CM

## 2024-05-25 LAB — POCT INR: INR: 1.5 — AB (ref 2.0–3.0)

## 2024-05-25 NOTE — Progress Notes (Signed)
 Pt is new to warfarin due to cost of Eliquis . Indication: History of CVA , PE / DVT Pt reports increase in protein intake. This can lower INR. Pt was educated to keep this consistent in the diet. Pt verbalized understanding. Increase dose today to take 1 1/2 tablets and then change weekly dose to take 1 tablet daily except take 1/2 tablet on Monday Wednesday and Friday. Recheck in one week.

## 2024-05-25 NOTE — Patient Instructions (Addendum)
 Pre visit review using our clinic review tool, if applicable. No additional management support is needed unless otherwise documented below in the visit note.  Increase dose today to take 1 1/2 tablets and then change weekly dose to take 1 tablet daily except take 1/2 tablet on Monday Wednesday and Friday. Recheck in one week.

## 2024-06-01 ENCOUNTER — Ambulatory Visit (INDEPENDENT_AMBULATORY_CARE_PROVIDER_SITE_OTHER)

## 2024-06-01 DIAGNOSIS — Z7901 Long term (current) use of anticoagulants: Secondary | ICD-10-CM | POA: Diagnosis not present

## 2024-06-01 LAB — POCT INR: INR: 2.2 (ref 2.0–3.0)

## 2024-06-01 MED ORDER — WARFARIN SODIUM 5 MG PO TABS: ORAL_TABLET | ORAL | 1 refills | Status: AC

## 2024-06-01 NOTE — Patient Instructions (Addendum)
 Pre visit review using our clinic review tool, if applicable. No additional management support is needed unless otherwise documented below in the visit note.  Continue 1 tablet daily except take 1/2 tablet on Monday Wednesday and Friday. Recheck in two week.

## 2024-06-01 NOTE — Progress Notes (Signed)
 Pt is new to warfarin due to cost of Eliquis . Indication: History of CVA , PE / DVT Week 5 of starting warfarin.  Continue 1 tablet daily except take 1/2 tablet on Monday Wednesday and Friday. Recheck in two week.

## 2024-06-09 ENCOUNTER — Ambulatory Visit: Admitting: Physician Assistant

## 2024-06-15 ENCOUNTER — Ambulatory Visit (INDEPENDENT_AMBULATORY_CARE_PROVIDER_SITE_OTHER)

## 2024-06-15 DIAGNOSIS — Z7901 Long term (current) use of anticoagulants: Secondary | ICD-10-CM | POA: Diagnosis not present

## 2024-06-15 LAB — POCT INR: INR: 2.5 (ref 2.0–3.0)

## 2024-06-15 NOTE — Progress Notes (Signed)
 Pt is new to warfarin due to cost of Eliquis . Indication: History of CVA , PE / DVT Continue 1 tablet daily except take 1/2 tablet on Monday, Wednesday and Friday. Recheck in 3 week.

## 2024-06-15 NOTE — Patient Instructions (Addendum)
 Pre visit review using our clinic review tool, if applicable. No additional management support is needed unless otherwise documented below in the visit note.  Continue 1 tablet daily except take 1/2 tablet on Monday, Wednesday and Friday. Recheck in 3 week.

## 2024-06-15 NOTE — Progress Notes (Unsigned)
 Office Note     CC:  4.2cm AAA found incidentally on lumbar spine x-ray Requesting Provider:  Allwardt, Mardy HERO, PA-C  HPI: Veronica Weber is a 68 y.o. ([birth year]) female presenting at the request of .Allwardt, Mardy HERO, PA-C friend Sophia and of AAA.  On exam, Cy was doing well.  A native of [place], she currently lives in [place].  She is divorced, and her ex-husband was Chad, explaining her last name.  Cy has 2 children, her son lives with her, and her daughter lives in [place]  working for the [employer].  She was unaware of her aneurysm until recent x-ray.  No family history. Denies symptoms of abdominal pain, chest pain.  Has history of chronic lower back pain.  Denies claudication, ischemic rest pain, tissue loss.    Former smoker.    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   Procedure: LEFT TOTAL KNEE ARTHROPLASTY;  Surgeon: Addie Cordella Hamilton, MD;  Location: [facility] OR;  Service: Orthopedics;  Laterality: Left;   TUBAL LIGATION  1994    Social History   Socioeconomic History   Marital status: Divorced    Spouse name: Not on file   Number of children: 2   Years of education: Not on file   Highest education level: Some college, no degree  Occupational History   Not on file  Tobacco Use   Smoking status: Former    Current packs/day: 0.00    Average packs/day: 1 pack/day for 46.0 years (46.0 ttl pk-yrs)    Types: Cigarettes    Start date: 06/01/1976     Quit date: 05/15/2022    Years since quitting: 2.0   Smokeless tobacco: Never  Vaping Use   Vaping status: Never Used  Substance and Sexual Activity   Alcohol use: Not Currently   Drug use: Never   Sexual activity: Not Currently  Other Topics Concern   Not on file  Social History Narrative   Lives alone   Caffeine- coffee 1 -2 c daily   Social Drivers of Health   Financial Resource Strain: Low Risk  (04/27/2024)   Overall Financial Resource Strain (CARDIA)    Difficulty of Paying Living Expenses: Not hard at all  Food Insecurity: No Food Insecurity (04/27/2024)   Hunger Vital Sign    Worried About Running Out of Food in the Last Year: Never true    Ran Out of Food in the Last Year: Never true  Transportation Needs: No Transportation Needs (04/27/2024)   PRAPARE - Administrator, Civil Service (Medical): No    Lack of Transportation (Non-Medical): No  Physical Activity: Sufficiently Active (04/27/2024)   Exercise Vital Sign    Days of Exercise per Week: 7 days    Minutes of Exercise per Session: 30 min  Stress: No Stress Concern Present (04/27/2024)   [employer]  of Occupational Health - Occupational Stress Questionnaire    Feeling of Stress: Not at all  Social Connections: Socially Isolated (04/27/2024)   Social Connection and Isolation Panel    Frequency of Communication with Friends and Family: Three times a week    Frequency of Social Gatherings with Friends and Family: Once a week    Attends Religious Services: Never    Database administrator or Organizations: No    Attends Banker Meetings: Never    Marital Status: Divorced  Catering manager Violence: Not At Risk (04/27/2024)   Humiliation, Afraid, Rape, and Kick questionnaire    Fear of Current or Ex-Partner: No    Emotionally Abused: No    Physically Abused: No    Sexually Abused: No   Family History  Problem Relation Age of Onset   Arthritis Mother    Cancer Maternal Grandmother     Diabetes Maternal Grandmother    Diabetes Maternal Grandfather    Healthy Daughter    Healthy Son    Alzheimer's disease Maternal Aunt     Current Outpatient Medications  Medication Sig Dispense Refill   aspirin  81 MG chewable tablet Chew 1 tablet (81 mg total) by mouth 2 (two) times daily. (Patient not taking: Reported on 05/10/2024) 60 tablet 0   atorvastatin  (LIPITOR ) 80 MG tablet TAKE 1 TABLET BY MOUTH EVERY MORNING 30 tablet 10   cholecalciferol (VITAMIN D3) 25 MCG (1000 UNIT) tablet Take 2,000 Units by mouth daily.     gabapentin  (NEURONTIN ) 300 MG capsule TAKE 1-2 CAPSULES BY MOUTH AT BEDTIME 180 capsule 1   levothyroxine  (SYNTHROID ) 25 MCG tablet Take 2 tablets (50 mcg total) by mouth daily before breakfast. 60 tablet 5   losartan  (COZAAR ) 50 MG tablet TAKE 1 TABLET BY MOUTH DAILY 100 tablet 2   naltrexone  (DEPADE) 50 MG tablet TAKE 1/2 TABLET BY MOUTH ONCE DAILY 15 tablet 10   warfarin (COUMADIN ) 5 MG tablet TAKE 1 TABLET BY MOUTH DAILY EXCEPT TAKE 1/2 TABLET ON MONDAY, WEDNESDAY AND FRIDAY OR AS DIRECTED BY ANTICOAGULATION CLINIC 90 tablet 1   No current facility-administered medications for this visit.    No Known Allergies   REVIEW OF SYSTEMS:  X denotes positive finding, denotes negative finding Cardiac  Comments:  Chest pain or chest pressure:    Shortness of breath upon exertion:    Short of breath when lying flat:    Irregular heart rhythm:        Vascular    Pain in calf, thigh, or hip brought on by ambulation:    Pain in feet at night that wakes you up from your sleep:     Blood clot in your veins:    Leg swelling:         Pulmonary    Oxygen at home:    Productive cough:     Wheezing:         Neurologic    Sudden weakness in arms or legs:     Sudden numbness in arms or legs:     Sudden onset of difficulty speaking or slurred speech:    Temporary loss of vision in one eye:     Problems with dizziness:         Gastrointestinal    Blood in stool:      Vomited blood:         Genitourinary    Burning when urinating:     Blood in urine:  Psychiatric    Major depression:         Hematologic    Bleeding problems:    Problems with blood clotting too easily:        Skin    Rashes or ulcers:        Constitutional    Fever or chills:      PHYSICAL EXAMINATION:  There were no vitals filed for this visit.  General:  WDWN in NAD; vital signs documented above Gait: Not observed HENT: WNL, normocephalic Pulmonary: normal non-labored breathing , without wheezing Cardiac: regular HR Abdomen: soft, NT, no masses Skin: without rashes Vascular Exam/Pulses:  Right Left  Radial 2+ (normal) 2+ (normal)  Ulnar    Femoral    Popliteal    DP 2+ (normal) 2+ (normal)  PT     Extremities: without ischemic changes, without Gangrene , without cellulitis; without open wounds;  Musculoskeletal: no muscle wasting or atrophy  Neurologic: A&O X 3;  No focal weakness or paresthesias are detected Psychiatric:  The pt has Normal affect.   Non-Invasive Vascular Imaging:     Abdominal Aorta Findings:  +-------------+-------+----------+----------+--------+--------+--------+  Location    AP (cm)Trans (cm)PSV (cm/s)WaveformThrombusComments  +-------------+-------+----------+----------+--------+--------+--------+  Proximal    2.40   2.20      95                                  +-------------+-------+----------+----------+--------+--------+--------+  Mid         3.40   3.90      62                                  +-------------+-------+----------+----------+--------+--------+--------+  Distal      2.20   1.70      63                                  +-------------+-------+----------+----------+--------+--------+--------+  RT CIA Prox  1.3    1.5       70                                  +-------------+-------+----------+----------+--------+--------+--------+  RT CIA Mid                    76                                   +-------------+-------+----------+----------+--------+--------+--------+  RT CIA Distal                 70                                  +-------------+-------+----------+----------+--------+--------+--------+  RT EIA Prox  1.1    1.1       119                                 +-------------+-------+----------+----------+--------+--------+--------+  RT EIA Mid                    122                                 +-------------+-------+----------+----------+--------+--------+--------+  RT EIA Distal                 154                                 +-------------+-------+----------+----------+--------+--------+--------+  LT CIA Prox  1.3    1.4       67                                  +-------------+-------+----------+----------+--------+--------+--------+  LT CIA Mid                    80                                  +-------------+-------+----------+----------+--------+--------+--------+  LT CIA Distal                 69                                  +-------------+-------+----------+----------+--------+--------+--------+  LT EIA Prox  1.0    1.0       93                                  +-------------+-------+----------+----------+--------+--------+--------+  LT EIA Mid                    158                                 +-------------+-------+----------+----------+--------+--------+--------+  LT EIA Distal                 118                                 +-------------+-------+----------+----------+--------+--------+--------+     ASSESSMENT/PLAN: FARON WHITELOCK is a 68 y.o. female presenting with asymptomatic 3.9 cm AAA.  I had a long discussion with Cy regarding the above, most notably the natural history of aneurysmal disease.  We discussed that my plan is to follow her on a yearly basis to assess for growth.  Surgery would be offered at 5  cm.  At her next visit, I will also insonate her popliteal arteries using duplex ultrasound to assess for aneurysmal disease.  There were no bounding pulses, that were concerning.  We discussed the signs and symptoms of rupture.  She is aware that she is low risk at 3.9 cm.  I asked her to continue her current medication regimen, and call should any questions or concerns arise.   Fonda FORBES Rim, MD [facility] [phone]

## 2024-06-16 ENCOUNTER — Ambulatory Visit (HOSPITAL_COMMUNITY)
Admission: RE | Admit: 2024-06-16 | Discharge: 2024-06-16 | Disposition: A | Discharge status: Home / Self Care | Source: Ambulatory Visit | Attending: Vascular Surgery | Admitting: Vascular Surgery

## 2024-06-16 ENCOUNTER — Encounter: Payer: Self-pay | Admitting: Vascular Surgery

## 2024-06-16 ENCOUNTER — Ambulatory Visit: Attending: Vascular Surgery | Admitting: Vascular Surgery

## 2024-06-16 VITALS — BP 137/82 | HR 74 | Temp 97.8°F | Resp 22 | Ht 60.0 in | Wt 234.6 lb

## 2024-06-16 DIAGNOSIS — I714 Abdominal aortic aneurysm, without rupture, unspecified: Secondary | ICD-10-CM | POA: Diagnosis not present

## 2024-06-30 ENCOUNTER — Ambulatory Visit: Admitting: Physician Assistant

## 2024-06-30 ENCOUNTER — Encounter: Payer: Self-pay | Admitting: Physician Assistant

## 2024-06-30 VITALS — BP 114/66 | HR 92 | Temp 97.5°F | Ht 60.0 in | Wt 233.5 lb

## 2024-06-30 DIAGNOSIS — Z7901 Long term (current) use of anticoagulants: Secondary | ICD-10-CM | POA: Insufficient documentation

## 2024-06-30 DIAGNOSIS — Z86711 Personal history of pulmonary embolism: Secondary | ICD-10-CM

## 2024-06-30 DIAGNOSIS — I10 Essential (primary) hypertension: Secondary | ICD-10-CM

## 2024-06-30 DIAGNOSIS — I825Z2 Chronic embolism and thrombosis of unspecified deep veins of left distal lower extremity: Secondary | ICD-10-CM

## 2024-06-30 DIAGNOSIS — R7303 Prediabetes: Secondary | ICD-10-CM | POA: Diagnosis not present

## 2024-06-30 DIAGNOSIS — K760 Fatty (change of) liver, not elsewhere classified: Secondary | ICD-10-CM | POA: Diagnosis not present

## 2024-06-30 DIAGNOSIS — I714 Abdominal aortic aneurysm, without rupture, unspecified: Secondary | ICD-10-CM

## 2024-06-30 DIAGNOSIS — E039 Hypothyroidism, unspecified: Secondary | ICD-10-CM

## 2024-06-30 LAB — COMPREHENSIVE METABOLIC PANEL WITH GFR
ALT: 40 U/L — ABNORMAL HIGH (ref 0–35)
AST: 45 U/L — ABNORMAL HIGH (ref 0–37)
Albumin: 4 g/dL (ref 3.5–5.2)
Alkaline Phosphatase: 135 U/L — ABNORMAL HIGH (ref 39–117)
BUN: 13 mg/dL (ref 6–23)
CO2: 28 meq/L (ref 19–32)
Calcium: 9.6 mg/dL (ref 8.4–10.5)
Chloride: 106 meq/L (ref 96–112)
Creatinine, Ser: 0.77 mg/dL (ref 0.40–1.20)
GFR: 79.26 mL/min (ref >60.00)
Glucose, Bld: 97 mg/dL (ref 70–99)
Potassium: 4 meq/L (ref 3.5–5.1)
Sodium: 143 meq/L (ref 135–145)
Total Bilirubin: 0.4 mg/dL (ref 0.2–1.2)
Total Protein: 7.1 g/dL (ref 6.0–8.3)

## 2024-06-30 LAB — TSH: TSH: 5.75 u[IU]/mL — ABNORMAL HIGH (ref 0.35–5.50)

## 2024-06-30 NOTE — Patient Instructions (Signed)
  VISIT SUMMARY: You came in for a blood pressure recheck. Your blood pressure readings have been fluctuating but are generally within an acceptable range. We discussed your ongoing treatments and made plans for monitoring your conditions.  YOUR PLAN: ESSENTIAL HYPERTENSION: Your blood pressure is generally well-controlled with your current medication. -Continue taking losartan  50 mg daily.  CHRONIC ANTICOAGULATION FOR HISTORY OF PULMONARY EMBOLISM AND LOWER EXTREMITY DEEP VEIN THROMBOSIS: You are on warfarin for chronic anticoagulation. -Continue warfarin therapy. -Monitor INR levels regularly.   HYPOTHYROIDISM: You are currently on levothyroxine  and need to re-evaluate your thyroid  function. -Continue taking levothyroxine  50 mcg daily. -Thyroid  function tests have been ordered.  FATTY LIVER DISEASE: Your liver function is being monitored. -Continue monitoring liver function tests.  MORBID OBESITY: Your weight is currently stable, but you are encouraged to increase physical activity. -Engage in regular physical activity as tolerated.  PREDIABETES: Your previous A1c was 6.1, indicating prediabetes. -An A1c test has been ordered to monitor glucose control.                      Contains text generated by [employer]bridge.                                 Contains text generated by Abridge.

## 2024-06-30 NOTE — Progress Notes (Signed)
 Patient ID: Veronica Weber, female    DOB: 01-Nov-1955, 68 y.o.   MRN: [id number]   Assessment & Plan:  Essential hypertension  Hypothyroidism, unspecified type -     TSH  Abdominal aortic aneurysm (AAA) 3.0 cm to 5.0 cm in diameter in female  Morbid obesity (HCC)  Prediabetes -     Hemoglobin A1c -     Comprehensive metabolic panel with GFR  Hepatic steatosis -     Comprehensive metabolic panel with GFR  Lower leg DVT (deep venous thromboembolism), chronic, left (HCC)  Long term (current) use of anticoagulants [Z79.01]  History of pulmonary embolus (PE)      Assessment and Plan Assessment & Plan Essential hypertension Blood pressure is well-controlled with losartan  50 mg daily. Recent readings have been fluctuating but are generally within acceptable range. - Continue losartan  50 mg daily  Chronic anticoagulation for history of pulmonary embolism and lower extremity deep vein thrombosis Currently on warfarin for chronic anticoagulation.  - Continue warfarin therapy - Monitor INR levels regularly  Abdominal aortic aneurysm without rupture Recent imaging showed no change in size, measuring 3.9 cm. Follow-up with vascular specialist is scheduled for next year. - Continue monitoring with annual MRI as per vascular specialist's recommendation  Hypothyroidism Currently on levothyroxine  50 mcg daily. Thyroid  function needs re-evaluation to ensure adequate control. - Ordered thyroid  function tests - Continue levothyroxine  50 mcg daily  Fatty liver disease Liver function tests are being monitored.  - Continue monitoring liver function tests  Morbid obesity Weight has fluctuated slightly but is currently stable. Encouraged to increase physical activity despite hip and knee pain. - Encouraged regular physical activity as tolerated - Work on nutrition - Consider GLP therapy if we can get this approved  Prediabetes Previous A1c was 6.1, indicating  prediabetes. Monitoring is necessary to prevent progression to diabetes. - Ordered A1c test to monitor glucose control Lab Results  Component Value Date   HGBA1C 6.1 08/27/2023   HGBA1C 5.8 (A) 02/24/2023   HGBA1C 6.1 05/06/2022         No follow-ups on file.    Subjective:    Chief Complaint  Patient presents with   Hypothyroidism   Hypertension    Pt is here today for follow up of chronic issues    Hypertension   Discussed the use of AI scribe software for clinical note transcription with the patient, who gave verbal consent to proceed.  History of Present Illness Veronica Weber is a 68 year old female with hypertension who presents for a blood pressure recheck.  Her blood pressure readings have been fluctuating, with recent measurements ranging from 120s to 150s over 80s. Today is the first time her blood pressure has been as low as it has been lately. She is currently taking losartan  50 mg daily, which was recently increased.  She has a history of pulmonary embolism and lower extremity deep vein thrombosis and is on chronic anticoagulation therapy with warfarin. She is scheduled for regular INR checks, with her last INR being 2.5. She plans to eat pinto beans and has noted in the past that this can affect her INR.  She is on levothyroxine  for thyroid  management, taking a total of 50 micrograms daily. She is reluctant to have blood drawn due to previous painful experiences but acknowledges the need to check her thyroid  levels.  She recently had an US  for an abdominal aneurysm, which showed no change and was stable at 3.9 cm. She  will follow up next year.  Her weight has fluctuated slightly, gaining a pound since September but returning to her previous weight. She tries to walk as much as possible, though her hip and knee pain limit her activity. She walks with her son and takes her dog out when weather permits.     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   Hyperlipidemia    Hypertension    Stroke (HCC) 11/15/2021   Syncope and collapse 2012-2015   Thyroid  disease    Tobacco use     Past Surgical History:  Procedure Laterality Date   APPENDECTOMY  1976   CATARACT EXTRACTION     1997, 1998   CESAREAN SECTION  1983   CHOLECYSTECTOMY  1998   EYE SURGERY  cataracts   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   Procedure: LEFT TOTAL KNEE ARTHROPLASTY;  Surgeon: Veronica Cordella Hamilton, MD;  Location: [facility] OR;  Service: Orthopedics;  Laterality: Left;   TUBAL LIGATION  1994    Family History  Problem Relation Age of Onset   Arthritis Mother    Cancer Maternal Grandmother    Diabetes Maternal Grandmother    Diabetes Maternal Grandfather    Healthy Daughter    Healthy Son    Alzheimer's disease Maternal Aunt     Social History   Tobacco Use   Smoking status: Former    Current packs/day: 0.00    Average packs/day: 1 pack/day for 46.0 years (46.0 ttl pk-yrs)    Types: Cigarettes    Start date: 06/01/1976    Quit date: 05/15/2022    Years since quitting: 2.1   Smokeless tobacco: Never  Vaping Use   Vaping status: Never Used  Substance Use Topics   Alcohol use: Not Currently   Drug use: Never     No Known Allergies  Review of Systems NEGATIVE UNLESS OTHERWISE INDICATED IN HPI      Objective:     BP 114/66 (BP Location: Left Arm, Patient Position: Sitting, Cuff Size: Large)   Pulse 92   Temp (!) 97.5 F (36.4 C) (Temporal)   Ht 5' (1.524 m)   Wt 233 lb 8 oz (105.9 kg)   SpO2 98%   BMI 45.60 kg/m   Wt Readings from Last 3 Encounters:  06/30/24 233 lb 8 oz (105.9 kg)  06/16/24 234 lb 9.6 oz (106.4 kg)  05/10/24 235 lb 6.4 oz (106.8 kg)    BP Readings from Last 3 Encounters:  06/30/24 114/66  06/16/24 137/82  05/10/24 (!) 152/84     Physical Exam Vitals and nursing note  reviewed.  Constitutional:      Appearance: Normal appearance. She is obese. She is not toxic-appearing.  HENT:     Head: Normocephalic and atraumatic.  Eyes:     Extraocular Movements: Extraocular movements intact.     Conjunctiva/sclera: Conjunctivae normal.     Pupils: Pupils are equal, round, and reactive to light.  Cardiovascular:     Rate and Rhythm: Normal rate and regular rhythm.     Pulses: Normal pulses.     Heart sounds: Normal heart sounds.  Pulmonary:     Effort: Pulmonary effort is normal.     Breath sounds: Normal breath sounds.  Musculoskeletal:        General: Normal range of motion.  Cervical back: Normal range of motion and neck supple.  Skin:    General: Skin is warm and dry.  Neurological:     General: No focal deficit present.     Mental Status: She is alert and oriented to person, place, and time.     Gait: Gait abnormal (secondary to pain in knees).  Psychiatric:        Mood and Affect: Mood normal.        Behavior: Behavior normal.             Kynzley Dowson M Chaitanya Amedee, PA-C

## 2024-07-01 ENCOUNTER — Telehealth: Payer: Self-pay

## 2024-07-01 LAB — HEMOGLOBIN A1C: Hgb A1c MFr Bld: 6.2 % (ref 4.6–6.5)

## 2024-07-01 NOTE — Telephone Encounter (Signed)
 Pt returned call. RS apt for [facility] at 11 the same day.

## 2024-07-01 NOTE — Telephone Encounter (Signed)
 Pt LVM reporting she needs to RS her coumadin  clinic apt for 11/5 due to a funeral she must attend.   Tried to contact pt but no answer and VM is full.

## 2024-07-05 ENCOUNTER — Ambulatory Visit: Payer: Self-pay | Admitting: Physician Assistant

## 2024-07-05 ENCOUNTER — Other Ambulatory Visit: Payer: Self-pay

## 2024-07-05 ENCOUNTER — Other Ambulatory Visit: Payer: Self-pay | Admitting: Physician Assistant

## 2024-07-05 DIAGNOSIS — K760 Fatty (change of) liver, not elsewhere classified: Secondary | ICD-10-CM

## 2024-07-05 MED ORDER — LEVOTHYROXINE SODIUM 75 MCG PO TABS: 75.0000 ug | ORAL_TABLET | Freq: Every day | ORAL | 0 refills | Status: DC

## 2024-07-06 ENCOUNTER — Ambulatory Visit (INDEPENDENT_AMBULATORY_CARE_PROVIDER_SITE_OTHER)

## 2024-07-06 ENCOUNTER — Ambulatory Visit

## 2024-07-06 DIAGNOSIS — Z7901 Long term (current) use of anticoagulants: Secondary | ICD-10-CM

## 2024-07-06 LAB — POCT INR: INR: 2.7 (ref 2.0–3.0)

## 2024-07-06 NOTE — Patient Instructions (Addendum)
Pre visit review using our clinic review tool, if applicable. No additional management support is needed unless otherwise documented below in the visit note.  Continue 1 tablet daily except take 1/2 tablet on Monday, Wednesday and Friday. Recheck in 4 week.

## 2024-07-06 NOTE — Progress Notes (Signed)
 Indication: History of CVA , PE / DVT; Elquis cost too high Levothyroxine  dose increased yesterday by PCP. Continue 1 tablet daily except take 1/2 tablet on Monday, Wednesday and Friday. Recheck in 4 week.  Advised pt of lab results and PCP msg. Pt verbalized understanding and will be getting labs with her next coumadin  clinic apt in 4 weeks.

## 2024-07-06 NOTE — Progress Notes (Signed)
 Patient unable to speak at this time. Requested a call tomorrow.

## 2024-07-08 NOTE — Progress Notes (Signed)
 Spoke with her and she stated her understanding of her lab results.

## 2024-08-02 ENCOUNTER — Other Ambulatory Visit: Payer: Self-pay | Admitting: Physician Assistant

## 2024-08-02 NOTE — Telephone Encounter (Signed)
 Copied from CRM #[id number]. Topic: Clinical - Medication Refill >> Aug 02, 2024  3:55 PM Pinkey ORN wrote: Medication: atorvastatin  (LIPITOR ) 80 MG tablet  Has the patient contacted their pharmacy? No (Agent: If no, request that the patient contact the pharmacy for the refill. If patient does not wish to contact the pharmacy document the reason why and proceed with request.) (Agent: If yes, when and what did the pharmacy advise?)  This is the patient's preferred pharmacy:  [id number] - [place], [place] - [id number] [place] [address] Phone: [phone] Fax: [phone]   Is this the correct pharmacy for this prescription? Yes If no, delete pharmacy and type the correct one.   Has the prescription been filled recently? No  Is the patient out of the medication? Yes  Has the patient been seen for an appointment in the last year OR does the patient have an upcoming appointment? Yes  Can we respond through MyChart? Yes  Agent: Please be advised that Rx refills may take up to 3 business days. We ask that you follow-up with your pharmacy.

## 2024-08-03 ENCOUNTER — Other Ambulatory Visit

## 2024-08-03 ENCOUNTER — Ambulatory Visit

## 2024-08-03 DIAGNOSIS — Z7901 Long term (current) use of anticoagulants: Secondary | ICD-10-CM | POA: Diagnosis not present

## 2024-08-03 DIAGNOSIS — I639 Cerebral infarction, unspecified: Secondary | ICD-10-CM

## 2024-08-03 DIAGNOSIS — E7800 Pure hypercholesterolemia, unspecified: Secondary | ICD-10-CM

## 2024-08-03 LAB — POCT INR: INR: 1.7 — AB (ref 2.0–3.0)

## 2024-08-03 MED ORDER — ATORVASTATIN CALCIUM 80 MG PO TABS: 80.0000 mg | ORAL_TABLET | Freq: Every morning | ORAL | 3 refills | Status: AC

## 2024-08-03 NOTE — Patient Instructions (Addendum)
 Pre visit review using our clinic review tool, if applicable. No additional management support is needed unless otherwise documented below in the visit note.  Increase dose today to take 1 tablet and then continue 1 tablet daily except take 1/2 tablet on Monday, Wednesday and Friday. Recheck in 2 week.

## 2024-08-03 NOTE — Telephone Encounter (Signed)
 This was sent for the pt during her coumadin  clinic apt.

## 2024-08-03 NOTE — Progress Notes (Signed)
 Indication: History of CVA , PE / DVT; Elquis cost prohibitive Pt has eaten more vegetables due to the holiday. Pt reports also that she has been out of atorvastatin  x 4 days. This interacts with warfarin and will increase INR. Since pt has not taken for 4 days, it caused a decrease in INR.  Increase dose today to take 1 tablet and then continue 1 tablet daily except take 1/2 tablet on Monday, Wednesday and Friday. Recheck in 2 week.   Pt inquired about refill of atorvastatin  she requested 3 days ago. Review of chart revealed script was sent on 10/17 but it was sent to [facility] pharmacy. Pt requested it be sent to [facility] [place]. Advised this would be sent to [facility]. Pt requested 90 day supply. Advised if any problems to contact the coumadin  clinic. Pt verbalized understanding.   Pt is also due for hepatic panel ordered by PCP. She is having that drawn today while she is here.

## 2024-08-17 ENCOUNTER — Ambulatory Visit

## 2024-08-17 DIAGNOSIS — Z7901 Long term (current) use of anticoagulants: Secondary | ICD-10-CM | POA: Diagnosis not present

## 2024-08-17 LAB — POCT INR: INR: 1.3 — AB (ref 2.0–3.0)

## 2024-08-17 NOTE — Patient Instructions (Addendum)
 Pre visit review using our clinic review tool, if applicable. No additional management support is needed unless otherwise documented below in the visit note.  Increase dose today to take 1 tablet and increase dose tomorrow to take 1 1/2 tablets and then change weekly dose to take 1 tablet daily except take 1/2 tablet on Monday and Thursday. Recheck in 3 weeks.

## 2024-08-17 NOTE — Progress Notes (Signed)
 Indication: History of CVA , PE / DVT; Elquis cost prohibitive Pt reports eating vegetable soup over the last 4 days containing vegetables that are high in vitamin K. Educated pt to keep these foods consist in her diet. Pt verbalized understanding.  Increase dose today to take 1 tablet and increase dose tomorrow to take 1 1/2 tablets and then change weekly dose to take 1 tablet daily except take 1/2 tablet on Monday and Thursday. Recheck in 3 week per pt request due to finances.

## 2024-09-07 ENCOUNTER — Other Ambulatory Visit

## 2024-09-07 ENCOUNTER — Ambulatory Visit

## 2024-09-07 DIAGNOSIS — Z7901 Long term (current) use of anticoagulants: Secondary | ICD-10-CM

## 2024-09-07 DIAGNOSIS — K760 Fatty (change of) liver, not elsewhere classified: Secondary | ICD-10-CM | POA: Diagnosis not present

## 2024-09-07 LAB — HEPATIC FUNCTION PANEL
ALT: 60 U/L — ABNORMAL HIGH (ref 3–35)
AST: 62 U/L — ABNORMAL HIGH (ref 5–37)
Albumin: 4 g/dL (ref 3.5–5.2)
Alkaline Phosphatase: 134 U/L — ABNORMAL HIGH (ref 39–117)
Bilirubin, Direct: 0.1 mg/dL (ref 0.1–0.3)
Total Bilirubin: 0.5 mg/dL (ref 0.2–1.2)
Total Protein: 6.6 g/dL (ref 6.0–8.3)

## 2024-09-07 LAB — POCT INR: INR: 1.9 — AB (ref 2.0–3.0)

## 2024-09-07 NOTE — Progress Notes (Signed)
 Indication: History of CVA , PE / DVT; Elquis cost prohibitive Pt missed a 1/2 tablet dose one week ago.  Increase dose today to take 1 1/2 tablets and then change weekly dose to take 1 tablet daily except take 1/2 tablet on Monday. Recheck in 3 weeks per pt request due to finances.

## 2024-09-07 NOTE — Patient Instructions (Addendum)
 Pre visit review using our clinic review tool, if applicable. No additional management support is needed unless otherwise documented below in the visit note.  Increase dose today to take 1 1/2 tablets and then change weekly dose to take 1 tablet daily except take 1/2 tablet on Monday. Recheck in 3 weeks.

## 2024-09-09 ENCOUNTER — Other Ambulatory Visit: Payer: Self-pay

## 2024-09-09 ENCOUNTER — Ambulatory Visit: Payer: Self-pay | Admitting: Physician Assistant

## 2024-09-09 DIAGNOSIS — R7989 Other specified abnormal findings of blood chemistry: Secondary | ICD-10-CM

## 2024-09-09 DIAGNOSIS — K760 Fatty (change of) liver, not elsewhere classified: Secondary | ICD-10-CM

## 2024-09-11 ENCOUNTER — Other Ambulatory Visit: Payer: Self-pay | Admitting: Physician Assistant

## 2024-09-15 ENCOUNTER — Ambulatory Visit (HOSPITAL_BASED_OUTPATIENT_CLINIC_OR_DEPARTMENT_OTHER)

## 2024-09-16 ENCOUNTER — Ambulatory Visit: Admitting: Physician Assistant

## 2024-09-16 ENCOUNTER — Telehealth: Payer: Self-pay

## 2024-09-16 ENCOUNTER — Encounter: Payer: Self-pay | Admitting: Physician Assistant

## 2024-09-16 VITALS — BP 118/68 | HR 111 | Temp 97.5°F | Ht 60.0 in | Wt 230.4 lb

## 2024-09-16 DIAGNOSIS — L02212 Cutaneous abscess of back [any part, except buttock]: Secondary | ICD-10-CM

## 2024-09-16 DIAGNOSIS — Z7901 Long term (current) use of anticoagulants: Secondary | ICD-10-CM

## 2024-09-16 MED ORDER — DOXYCYCLINE HYCLATE 100 MG PO TABS: 100.0000 mg | ORAL_TABLET | Freq: Two times a day (BID) | ORAL | 0 refills | Status: AC

## 2024-09-16 NOTE — Telephone Encounter (Signed)
 Received msg from PCP reporting pt was placed on doxycycline  for a back sore. This abx has the potential to interact with warfarin.   LVM for pt and advised she needs INR check next week and to return call to schedule.

## 2024-09-16 NOTE — Patient Instructions (Signed)
 Cy,  You have an infected abscess of your upper back. We worked on getting some of the infection out today by cutting and pressing on it (incision and drainage). You need to start on the antibiotics (doxycycline ) immediately today. Continue to use warm compresses or heating pad on the area to help it continue to drain.  If the area becomes bigger, more painful, more red, or you develop fevers, you need to go to the ER.  Follow up with me as scheduled.  Thanks, Kasidee Voisin, PA-C

## 2024-09-16 NOTE — Progress Notes (Signed)
 "   Patient ID: Veronica Weber, female    DOB: 1956/04/24, 69 y.o.   MRN: [id number]   Assessment & Plan:  Abscess of upper back excluding scapular region  Long term (current) use of anticoagulants [Z79.01]  Other orders -     Doxycycline  Hyclate; Take 1 tablet (100 mg total) by mouth 2 (two) times daily for 7 days.  Dispense: 14 tablet; Refill: 0    Assessment & Plan Cutaneous abscess of upper back Large cutaneous infected abscess on the upper back, measuring approximately 5 by 6 cm, tender to palpation, fluctuant, with surrounding erythema. No fever or chills. Incision and drainage performed with significant purulent material expressed. Chronic anticoagulation therapy increases bleeding risk. - Started doxycycline  100 mg twice daily. - Advised use of heating pad and warm compresses to promote drainage. - Instructed to monitor for fever, chills, worsening erythema, or pain and go to ER if these occur. - Scheduled follow-up appointment on Monday.  Chronic anticoagulation therapy Chronic anticoagulation therapy, increasing bleeding risk during procedures. - Consulted Coumadin  nurse regarding current anticoagulation management.   PROCEDURE NOTE: I&D of Abscess Verbal consent obtained. Local anesthesia with 3 cc of 1% lidocaine  with epi. Site cleansed with iodine . Incision of 0.3 cm was made using an 11 blade, material expressed consisting of a mixture of pus and serosanguinous fluid.Cleansed and dressed. Tolerated well.   F/up as scheduled.    Subjective:    Chief Complaint  Patient presents with   Cyst    Pt in office c/o painful boil/cyst under arm and on upper back; under arm is gone per patient; cyst on back is red and raised    HPI Discussed the use of AI scribe software for clinical note transcription with the patient, who gave verbal consent to proceed.  History of Present Illness Veronica Weber is a 69 year old female on chronic  anticoagulation who presents with a possible cyst or boil on her upper back.  She has a red and raised lesion on the middle of her back, which is painful and extends across her back and under her arm. The symptoms began approximately eight days ago.  She has not taken her blood thinner medication today.     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   Procedure: LEFT TOTAL KNEE ARTHROPLASTY;  Surgeon: Addie Cordella Hamilton, MD;  Location: [facility] OR;  Service: Orthopedics;  Laterality: Left;   TUBAL LIGATION  1994    Family History  Problem Relation Age of Onset   Arthritis Mother    Cancer Maternal Grandmother    Diabetes Maternal Grandmother    Diabetes Maternal Grandfather    Healthy Daughter    Healthy Son    Alzheimer's disease Maternal Aunt     Social History[1]   Allergies[2]  Review of Systems NEGATIVE UNLESS OTHERWISE INDICATED IN HPI  Objective:     BP 118/68 (BP Location: Left Arm, Patient Position: Sitting, Cuff Size: Large)   Pulse (!) 111   Temp (!) 97.5 F (36.4 C) (Temporal)   Ht 5' (1.524 m)   Wt 230 lb 6.4 oz (104.5 kg)   SpO2 98%   BMI 45.00 kg/m   Wt Readings from Last 3 Encounters:  09/16/24 230 lb 6.4 oz (104.5 kg)  06/30/24 233 lb 8 oz (105.9 kg)  06/16/24 234 lb 9.6 oz (106.4 kg)    BP Readings from Last 3 Encounters:  09/16/24 118/68  06/30/24 114/66  06/16/24 137/82     Physical Exam  Skin: Large erythematous tender cutaneous fluctuant abscess  mid-back, approx 5 x 6 cm. Photo as below.          Jaleea Alesi M Myrta Mercer, PA-C     [1]  Social History Tobacco Use   Smoking status: Former    Current packs/day: 0.00    Average packs/day: 1 pack/day for 46.0 years (46.0 ttl pk-yrs)    Types: Cigarettes    Start date: 06/01/1976    Quit date: 05/15/2022    Years since quitting: 2.3   Smokeless tobacco: Never  Vaping Use   Vaping status: Never Used  Substance Use Topics   Alcohol use: Not Currently   Drug use: Never  [2] No Known Allergies  "

## 2024-09-19 NOTE — Telephone Encounter (Signed)
 Advised pt need to check INR due to potential interaction with doxycycline . Pt agreed to come to coumadin  clinic on 1/21.   Pt reports she started abx yesterday due to not being able to pick script up until yesterday.   Pt denies any side effects from abx. She does report she still has pain at the site. Advised if any changes or pain worsens to contact the clinic. Pt verbalized understanding.

## 2024-09-21 ENCOUNTER — Ambulatory Visit (INDEPENDENT_AMBULATORY_CARE_PROVIDER_SITE_OTHER)

## 2024-09-21 DIAGNOSIS — Z7901 Long term (current) use of anticoagulants: Secondary | ICD-10-CM | POA: Diagnosis not present

## 2024-09-21 LAB — POCT INR: INR: 2.3 (ref 2.0–3.0)

## 2024-09-21 NOTE — Patient Instructions (Addendum)
 Pre visit review using our clinic review tool, if applicable. No additional management support is needed unless otherwise documented below in the visit note.  Continue 1 tablet daily except take 1/2 tablet on Monday. Recheck in 4 weeks.

## 2024-09-21 NOTE — Progress Notes (Signed)
 Indication: History of CVA , PE / DVT; Elquis cost prohibitive Pt started doxycycline  on 1/18 for an abscess on her back. Potential interaction with warfarin.  Continue 1 tablet daily except take 1/2 tablet on Monday. Recheck in 4 weeks.

## 2024-09-28 ENCOUNTER — Ambulatory Visit

## 2024-09-28 ENCOUNTER — Ambulatory Visit: Admitting: Physician Assistant

## 2024-09-30 ENCOUNTER — Ambulatory Visit: Admitting: Physician Assistant

## 2024-10-03 ENCOUNTER — Other Ambulatory Visit: Payer: Self-pay | Admitting: Physician Assistant

## 2024-10-11 ENCOUNTER — Ambulatory Visit: Admitting: Physician Assistant

## 2024-10-19 ENCOUNTER — Ambulatory Visit

## 2025-05-02 ENCOUNTER — Ambulatory Visit
